# Patient Record
Sex: Female | Born: 1985 | Race: White | Hispanic: No | Marital: Married | Smoking: Never smoker
Health system: Southern US, Community
[De-identification: ages and names within clinical notes are randomized; demographics above are authoritative.]

## PROBLEM LIST (undated history)

## (undated) ENCOUNTER — Inpatient Hospital Stay (HOSPITAL_COMMUNITY): Payer: Self-pay

## (undated) DIAGNOSIS — K219 Gastro-esophageal reflux disease without esophagitis: Secondary | ICD-10-CM

## (undated) DIAGNOSIS — O169 Unspecified maternal hypertension, unspecified trimester: Secondary | ICD-10-CM

## (undated) DIAGNOSIS — R87629 Unspecified abnormal cytological findings in specimens from vagina: Secondary | ICD-10-CM

## (undated) DIAGNOSIS — Q796 Ehlers-Danlos syndrome, unspecified: Secondary | ICD-10-CM

## (undated) DIAGNOSIS — R112 Nausea with vomiting, unspecified: Secondary | ICD-10-CM

## (undated) DIAGNOSIS — R519 Headache, unspecified: Secondary | ICD-10-CM

## (undated) DIAGNOSIS — R51 Headache: Secondary | ICD-10-CM

## (undated) DIAGNOSIS — Z9889 Other specified postprocedural states: Secondary | ICD-10-CM

## (undated) DIAGNOSIS — F419 Anxiety disorder, unspecified: Secondary | ICD-10-CM

## (undated) DIAGNOSIS — K589 Irritable bowel syndrome without diarrhea: Secondary | ICD-10-CM

## (undated) DIAGNOSIS — G43909 Migraine, unspecified, not intractable, without status migrainosus: Secondary | ICD-10-CM

## (undated) DIAGNOSIS — F329 Major depressive disorder, single episode, unspecified: Secondary | ICD-10-CM

## (undated) DIAGNOSIS — F32A Depression, unspecified: Secondary | ICD-10-CM

## (undated) DIAGNOSIS — T8859XA Other complications of anesthesia, initial encounter: Secondary | ICD-10-CM

## (undated) DIAGNOSIS — M419 Scoliosis, unspecified: Secondary | ICD-10-CM

## (undated) DIAGNOSIS — J45909 Unspecified asthma, uncomplicated: Secondary | ICD-10-CM

## (undated) HISTORY — DX: Unspecified asthma, uncomplicated: J45.909

## (undated) HISTORY — DX: Depression, unspecified: F32.A

## (undated) HISTORY — DX: Unspecified abnormal cytological findings in specimens from vagina: R87.629

## (undated) HISTORY — DX: Major depressive disorder, single episode, unspecified: F32.9

## (undated) HISTORY — DX: Unspecified maternal hypertension, unspecified trimester: O16.9

## (undated) HISTORY — PX: TONSILLECTOMY: SUR1361

## (undated) HISTORY — DX: Irritable bowel syndrome, unspecified: K58.9

## (undated) HISTORY — DX: Headache, unspecified: R51.9

## (undated) HISTORY — DX: Anxiety disorder, unspecified: F41.9

## (undated) HISTORY — DX: Migraine, unspecified, not intractable, without status migrainosus: G43.909

## (undated) HISTORY — DX: Headache: R51

## (undated) HISTORY — PX: KNEE ARTHROSCOPY: SUR90

---
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
## 2003-06-26 ENCOUNTER — Inpatient Hospital Stay (HOSPITAL_COMMUNITY): Admission: EM | Admit: 2003-06-26 | Discharge: 2003-06-28 | Payer: Self-pay | Admitting: Urology

## 2003-07-24 ENCOUNTER — Ambulatory Visit (HOSPITAL_COMMUNITY): Admission: RE | Admit: 2003-07-24 | Discharge: 2003-07-24 | Payer: Self-pay | Admitting: Urology

## 2003-11-13 ENCOUNTER — Other Ambulatory Visit: Admission: RE | Admit: 2003-11-13 | Discharge: 2003-11-13 | Payer: Self-pay | Admitting: Gynecology

## 2004-04-22 ENCOUNTER — Other Ambulatory Visit: Admission: RE | Admit: 2004-04-22 | Discharge: 2004-04-22 | Payer: Self-pay | Admitting: Gynecology

## 2004-09-02 ENCOUNTER — Other Ambulatory Visit: Admission: RE | Admit: 2004-09-02 | Discharge: 2004-09-02 | Payer: Self-pay | Admitting: Gynecology

## 2004-10-12 ENCOUNTER — Ambulatory Visit (HOSPITAL_COMMUNITY): Admission: RE | Admit: 2004-10-12 | Discharge: 2004-10-12 | Payer: Self-pay | Admitting: Allergy

## 2005-02-08 ENCOUNTER — Other Ambulatory Visit: Admission: RE | Admit: 2005-02-08 | Discharge: 2005-02-08 | Payer: Self-pay | Admitting: Gynecology

## 2005-03-22 ENCOUNTER — Ambulatory Visit: Payer: Self-pay | Admitting: Internal Medicine

## 2005-04-01 ENCOUNTER — Ambulatory Visit: Payer: Self-pay | Admitting: Internal Medicine

## 2005-05-18 ENCOUNTER — Ambulatory Visit: Payer: Self-pay | Admitting: Internal Medicine

## 2005-05-24 ENCOUNTER — Ambulatory Visit (HOSPITAL_COMMUNITY): Admission: RE | Admit: 2005-05-24 | Discharge: 2005-05-24 | Payer: Self-pay | Admitting: Internal Medicine

## 2005-06-10 ENCOUNTER — Ambulatory Visit: Payer: Self-pay | Admitting: Internal Medicine

## 2005-06-17 ENCOUNTER — Ambulatory Visit (HOSPITAL_COMMUNITY): Admission: RE | Admit: 2005-06-17 | Discharge: 2005-06-17 | Payer: Self-pay | Admitting: Internal Medicine

## 2005-06-17 ENCOUNTER — Encounter: Payer: Self-pay | Admitting: Internal Medicine

## 2005-06-22 ENCOUNTER — Ambulatory Visit: Payer: Self-pay | Admitting: Internal Medicine

## 2005-07-08 ENCOUNTER — Encounter (INDEPENDENT_AMBULATORY_CARE_PROVIDER_SITE_OTHER): Payer: Self-pay | Admitting: *Deleted

## 2005-07-08 ENCOUNTER — Ambulatory Visit: Payer: Self-pay | Admitting: Internal Medicine

## 2005-07-08 DIAGNOSIS — K2990 Gastroduodenitis, unspecified, without bleeding: Secondary | ICD-10-CM

## 2005-07-08 DIAGNOSIS — K2970 Gastritis, unspecified, without bleeding: Secondary | ICD-10-CM | POA: Insufficient documentation

## 2005-08-09 ENCOUNTER — Other Ambulatory Visit: Admission: RE | Admit: 2005-08-09 | Discharge: 2005-08-09 | Payer: Self-pay | Admitting: Obstetrics and Gynecology

## 2006-02-10 ENCOUNTER — Ambulatory Visit: Payer: Self-pay | Admitting: Internal Medicine

## 2006-02-18 ENCOUNTER — Ambulatory Visit: Payer: Self-pay | Admitting: Internal Medicine

## 2006-02-18 ENCOUNTER — Encounter: Payer: Self-pay | Admitting: Internal Medicine

## 2006-02-18 DIAGNOSIS — K644 Residual hemorrhoidal skin tags: Secondary | ICD-10-CM | POA: Insufficient documentation

## 2006-06-21 ENCOUNTER — Ambulatory Visit: Payer: Self-pay | Admitting: Internal Medicine

## 2006-06-24 ENCOUNTER — Encounter (INDEPENDENT_AMBULATORY_CARE_PROVIDER_SITE_OTHER): Payer: Self-pay | Admitting: *Deleted

## 2006-06-24 ENCOUNTER — Ambulatory Visit: Payer: Self-pay | Admitting: Internal Medicine

## 2007-01-19 ENCOUNTER — Ambulatory Visit: Payer: Self-pay | Admitting: Internal Medicine

## 2007-04-03 IMAGING — RF DG UGI W/ SMALL BOWEL
19 of 24 series · 19 of 24 positions shown · non-contrast
Comparison: none

CLINICAL DATA: 19-year-old with right lower quadrant abdominal pain.  Constipation.
UGI:

[Series 1: run · 1 of 1 slices shown (1 of 19)]
[im 1/1]
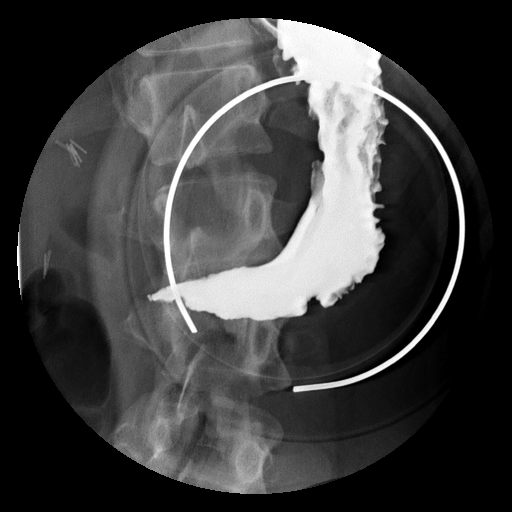

[Series 2: run · 1 of 1 slices shown (2 of 19)]
[im 1/1]
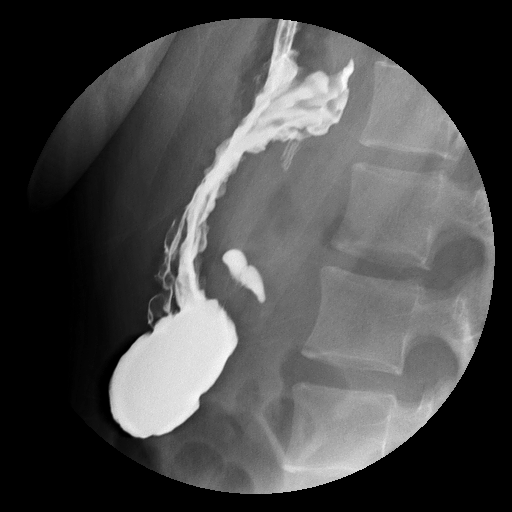

[Series 4: run · 1 of 1 slices shown (3 of 19)]
[im 1/1]
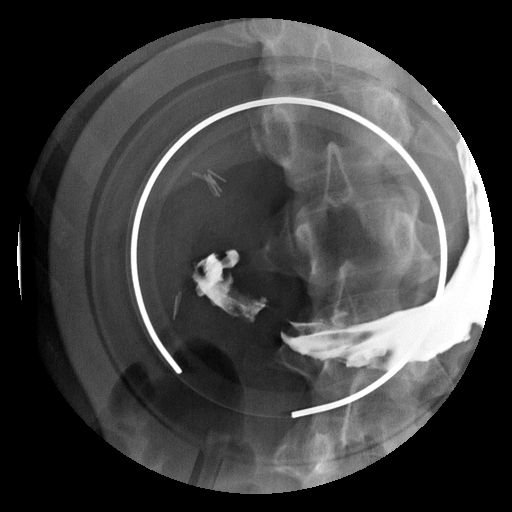

[Series 5: run · 1 of 1 slices shown (4 of 19)]
[im 1/1]
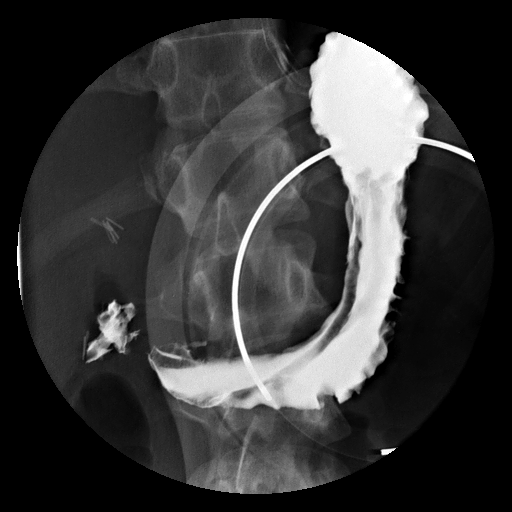

[Series 6: run · 1 of 1 slices shown (5 of 19)]
[im 1/1]
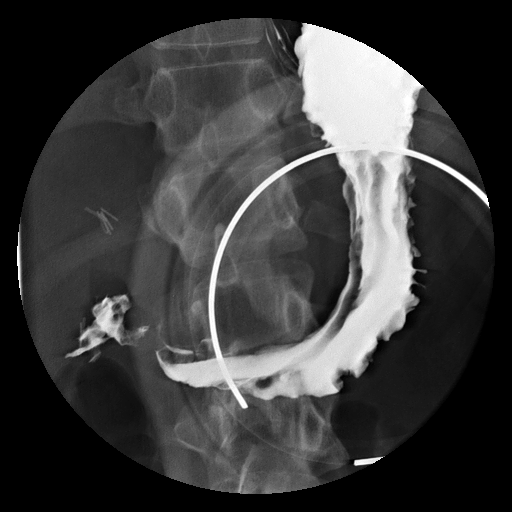

[Series 7: run · 1 of 1 slices shown (6 of 19)]
[im 1/1]
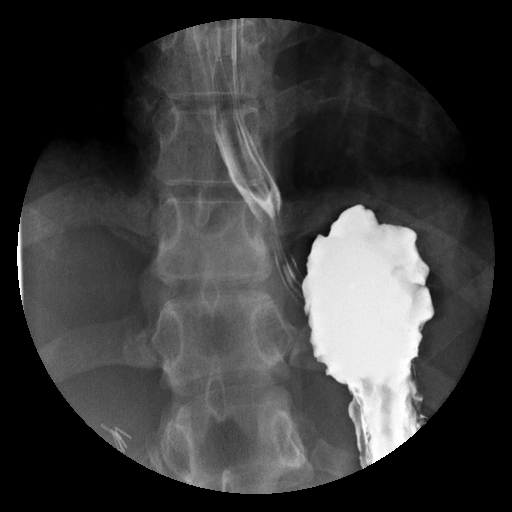

[Series 9: run · 1 of 1 slices shown (7 of 19)]
[im 1/1]
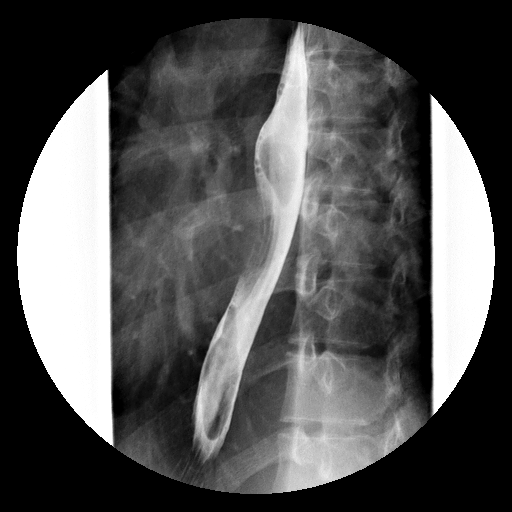

[Series 10: run · 1 of 1 slices shown (8 of 19)]
[im 1/1]
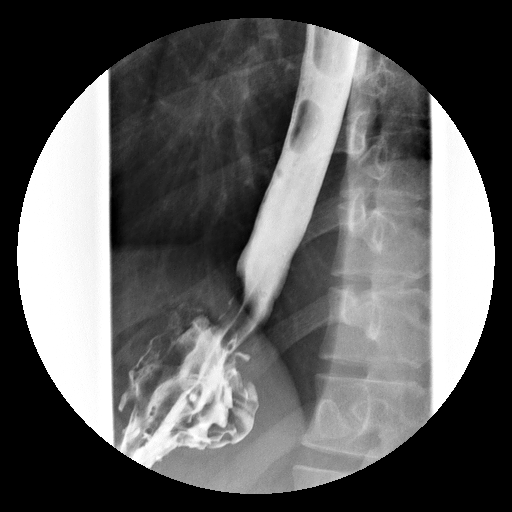

[Series 11: run · 1 of 1 slices shown (9 of 19)]
[im 1/1]
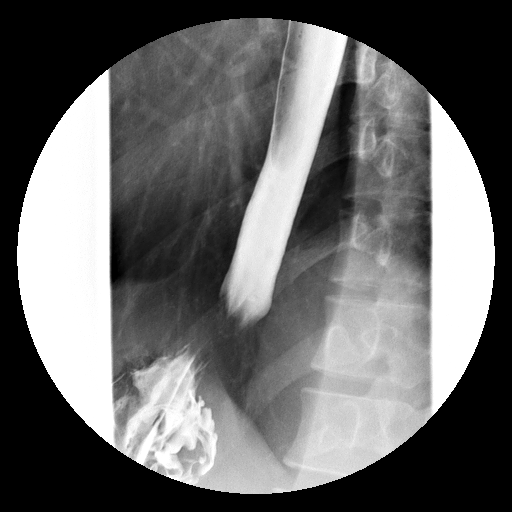

[Series 13: run · 1 of 1 slices shown (10 of 19)]
[im 1/1]
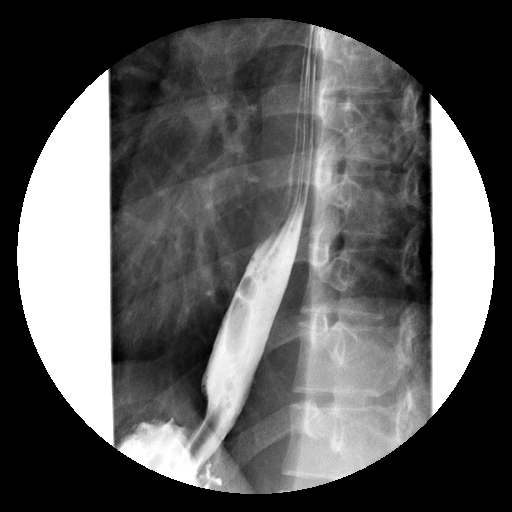

[Series 14: run · 1 of 1 slices shown (11 of 19)]
[im 1/1]
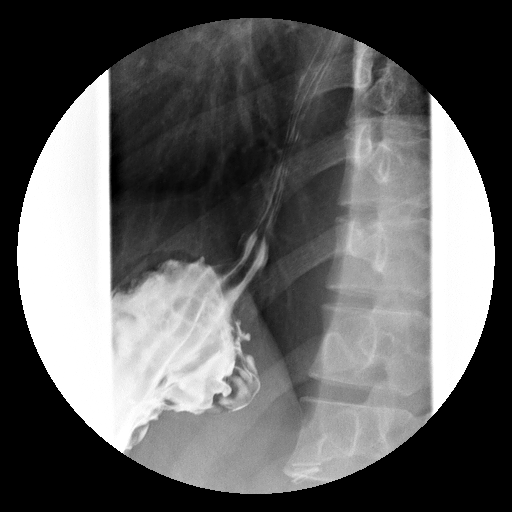

[Series 15: run · 1 of 1 slices shown (12 of 19)]
[im 1/1]
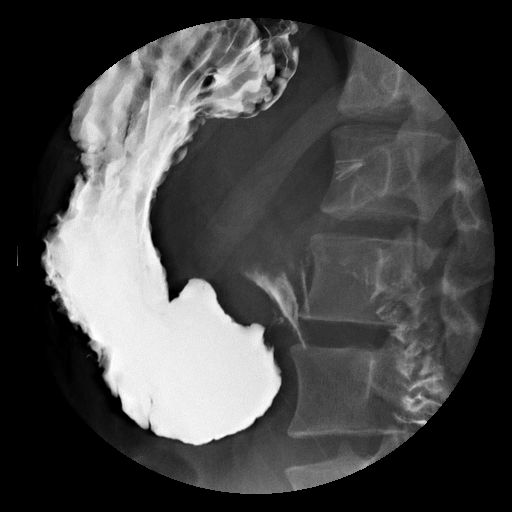

[Series 16: run · 1 of 1 slices shown (13 of 19)]
[im 1/1]
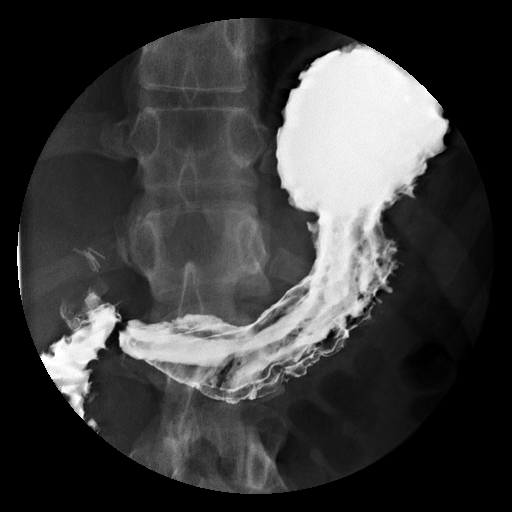

[Series 18: run · 1 of 1 slices shown (14 of 19)]
[im 1/1]
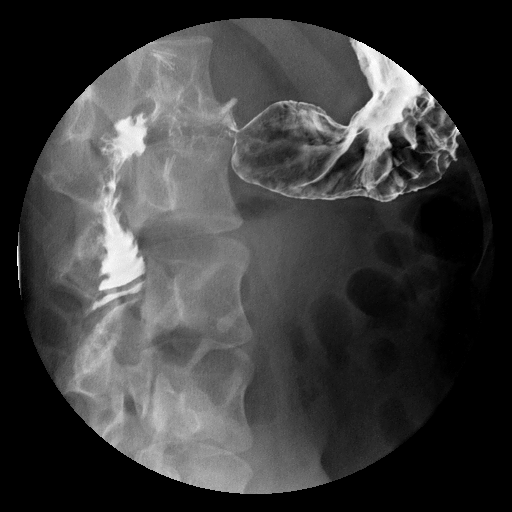

[Series 19: run · 1 of 1 slices shown (15 of 19)]
[im 1/1]
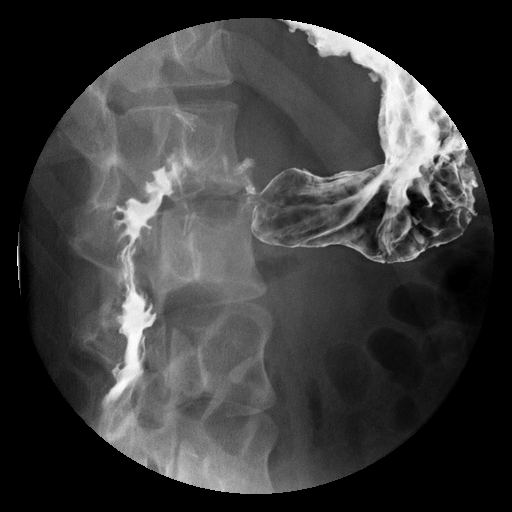

[Series 20: run · 1 of 1 slices shown (16 of 19)]
[im 1/1]
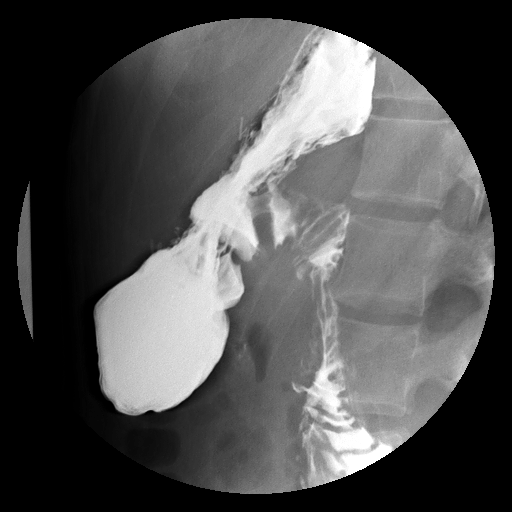

[Series 21: run · 1 of 1 slices shown (17 of 19)]
[im 1/1]
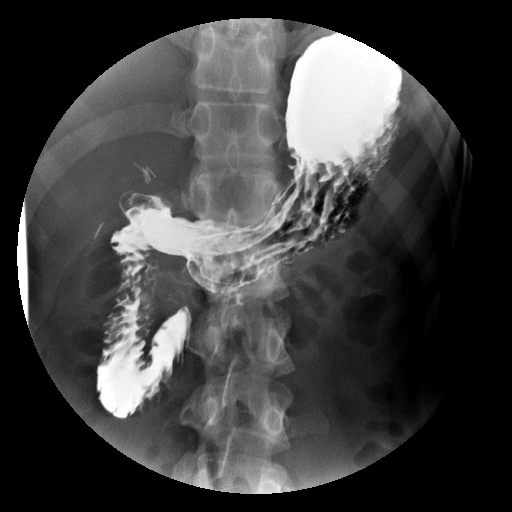

[Series 23: run · 1 of 1 slices shown (18 of 19)]
[im 1/1]
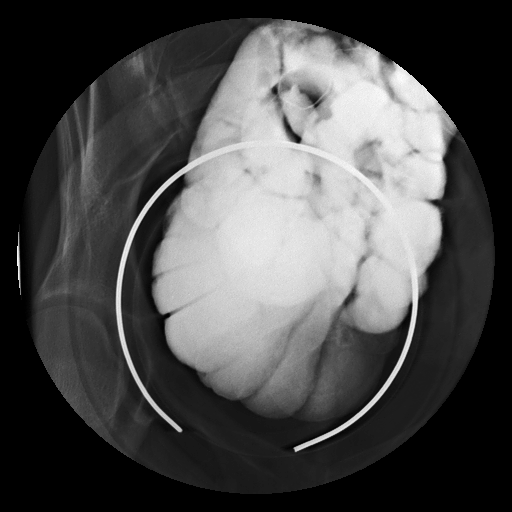

[Series 24: run · 1 of 1 slices shown (19 of 19)]
[im 1/1]
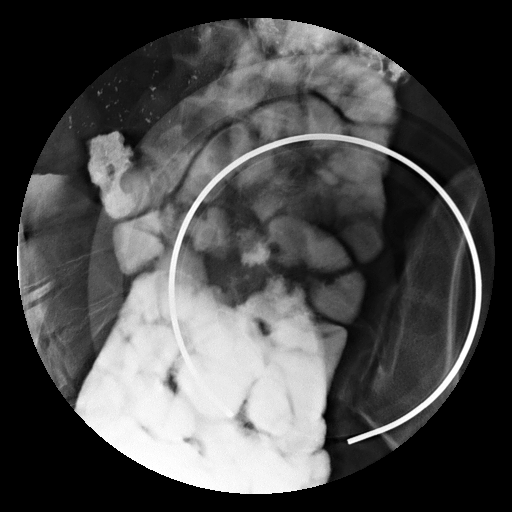

[19 of 24 positions shown; findings below may reference images not displayed]

FINDINGS: Normal esophageal motility.  No intrinsic or extrinsic lesions of the esophagus were identified and no mucosal abnormalities were seen.  The patient does have a small sliding type hiatal hernia.  No gastroesophageal reflux was demonstrated.  
The stomach, duodenal bulb and C-loop are normal in appearance.
IMPRESSION: 1.  Small sliding type hiatal hernia without gastroesophageal reflux.  
2.  Otherwise, unremarkable upper GI study.
SMALL BOWEL FOLLOW THROUGH:
Small bowel transit time was normal at 1 hour.  The patient has a low-lying cecum which is in the low pelvis. The patient is quite thin and small bowel loops were clustered in the pelvis also.  I was not able to identify the terminal ileum but I do not see any definite inflammatory changes or separated loops of bowel.  The proximal loops of jejunum demonstrate a normal mucosal fold pattern and no fixed filling defects.  The ileal loops of small bowel also demonstrate normal mucosal fold pattern and no mass lesions are seen.
IMPRESSION: 1.  Normal appearing loops of jejunum and ileum with a normal small bowel transit time.
2.  Terminal ileum could not be visualized because of a low-lying cecum deep in the pelvis.

## 2007-06-22 ENCOUNTER — Encounter: Payer: Self-pay | Admitting: Cardiovascular Disease

## 2008-01-27 DIAGNOSIS — G43009 Migraine without aura, not intractable, without status migrainosus: Secondary | ICD-10-CM

## 2008-01-27 DIAGNOSIS — J45909 Unspecified asthma, uncomplicated: Secondary | ICD-10-CM | POA: Insufficient documentation

## 2008-01-27 DIAGNOSIS — K219 Gastro-esophageal reflux disease without esophagitis: Secondary | ICD-10-CM | POA: Insufficient documentation

## 2008-01-27 DIAGNOSIS — J309 Allergic rhinitis, unspecified: Secondary | ICD-10-CM | POA: Insufficient documentation

## 2008-01-27 DIAGNOSIS — Z8719 Personal history of other diseases of the digestive system: Secondary | ICD-10-CM

## 2008-01-27 HISTORY — DX: Unspecified asthma, uncomplicated: J45.909

## 2008-03-08 DIAGNOSIS — Q796 Ehlers-Danlos syndrome, unspecified: Secondary | ICD-10-CM | POA: Insufficient documentation

## 2008-03-08 DIAGNOSIS — K589 Irritable bowel syndrome without diarrhea: Secondary | ICD-10-CM | POA: Insufficient documentation

## 2008-11-25 ENCOUNTER — Encounter: Payer: Self-pay | Admitting: Internal Medicine

## 2009-07-22 ENCOUNTER — Ambulatory Visit (HOSPITAL_COMMUNITY): Admission: RE | Admit: 2009-07-22 | Discharge: 2009-07-22 | Payer: Self-pay | Admitting: Obstetrics and Gynecology

## 2009-08-04 ENCOUNTER — Encounter: Payer: Self-pay | Admitting: Cardiovascular Disease

## 2009-09-01 DIAGNOSIS — Q796 Ehlers-Danlos syndrome, unspecified: Secondary | ICD-10-CM | POA: Insufficient documentation

## 2009-09-08 ENCOUNTER — Ambulatory Visit: Payer: Self-pay | Admitting: Cardiovascular Disease

## 2009-09-08 DIAGNOSIS — R002 Palpitations: Secondary | ICD-10-CM | POA: Insufficient documentation

## 2009-09-18 ENCOUNTER — Ambulatory Visit: Payer: Self-pay | Admitting: Cardiology

## 2009-09-18 ENCOUNTER — Encounter: Payer: Self-pay | Admitting: Cardiovascular Disease

## 2009-09-18 ENCOUNTER — Ambulatory Visit (HOSPITAL_COMMUNITY): Admission: RE | Admit: 2009-09-18 | Discharge: 2009-09-18 | Payer: Self-pay | Admitting: Cardiovascular Disease

## 2009-09-18 ENCOUNTER — Ambulatory Visit: Payer: Self-pay

## 2010-02-01 ENCOUNTER — Inpatient Hospital Stay (HOSPITAL_COMMUNITY): Admission: AD | Admit: 2010-02-01 | Discharge: 2010-02-04 | Payer: Self-pay | Admitting: Obstetrics and Gynecology

## 2010-02-10 ENCOUNTER — Encounter: Admission: RE | Admit: 2010-02-10 | Discharge: 2010-02-19 | Payer: Self-pay | Admitting: Obstetrics and Gynecology

## 2010-06-16 DIAGNOSIS — J309 Allergic rhinitis, unspecified: Secondary | ICD-10-CM | POA: Insufficient documentation

## 2010-06-28 ENCOUNTER — Emergency Department (HOSPITAL_BASED_OUTPATIENT_CLINIC_OR_DEPARTMENT_OTHER): Admission: EM | Admit: 2010-06-28 | Discharge: 2010-06-29 | Payer: Self-pay | Admitting: Emergency Medicine

## 2010-09-13 ENCOUNTER — Encounter: Payer: Self-pay | Admitting: Obstetrics and Gynecology

## 2010-09-13 ENCOUNTER — Encounter: Payer: Self-pay | Admitting: Allergy

## 2010-09-22 NOTE — Letter (Signed)
Summary: [facility] P[facility] Flow Record   [facility]   Imported By: Roderic Ovens 10/01/2009 12:11:56  _____________________________________________________________________  External Attachment:    Type:   Image     Comment:   External Document

## 2010-09-22 NOTE — Assessment & Plan Note (Signed)
Summary: npv/ehlers-danlos syndrome/jml  Medications Added PRENATAL VITAMINS 0.8 MG TABS (PRENATAL MULTIVIT-MIN-FE-FA) Take 1 tablet by mouth once a day FERROUS SULFATE 325 (65 FE) MG TBEC (FERROUS SULFATE) Take 1 tablet by mouth once a day        Visit Type:  Initial Consult Primary Provider:  Dr Senaida Ores  CC:  New PV evaluation per Dr. Huel Cote-.  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 The patient is at [redacted] weeks gestation of her first pregnancy. She was diagnosed with Ehlers-Danlos syndrome approximately 4 years ago. She has the joint hypermobility subtype and has tested negative for the vascular subtype.  The patient reports an innocent heart murmur during early childhood, but this apparently resolved as an adult. She has no history of cardiovascular disease. She has had episodic palpitations.  The patient denies chest pain, dyspnea, orthopnea, PND, edema, lightheadedness, or syncope.   Current Medications (verified): 1)  Prenatal Vitamins 0.8 Mg Tabs (Prenatal Multivit-Min-Fe-Fa) .... Take 1 Tablet By Mouth Once A Day 2)  Ferrous Sulfate 325 (65 Fe) Mg Tbec (Ferrous Sulfate) .... Take 1 Tablet By Mouth Once A Day  Allergies: 1)  ! Keflex 2)  ! * Ketac  Past History:  Past medical, surgical, family and social histories (including risk factors) reviewed, and no changes noted (except as noted below).  Past Medical History: Reviewed history from 09/01/2009 and no changes required. Current Problems:  EHLERS-DANLOS SYNDROME (ICD-756.83) GERD (ICD-530.81) COLITIS, HX OF (ICD-V12.79) GASTRITIS (ICD-535.50) HEMORRHOIDS, EXTERNAL (ICD-455.3) IRRITABLE BOWEL SYNDROME, HX OF (ICD-V12.79) ALLERGIC RHINITIS (ICD-477.9) ANXIETY (ICD-300.00) ASTHMA (ICD-493.90) HEADACHE, CHRONIC, HX OF (ICD-V15.9)  Past Surgical History: Reviewed history from 09/01/2009 and no changes  required. Rt Knee Arthroscopy (1998, 2001) Cholecystectomy (2002) Tonsillectomy (1988) Bilateral Ear Tubes Placed (1986) Bilateral turbinate surgery (1998)  Family History: Reviewed history from 09/01/2009 and no changes required.  Her father has history of bipolar disorder.  There is no other history of other significant family history.  Social History: Reviewed history from 09/01/2009 and no changes required. She is married. She is a CMA. No children . No alcohol, tobacco or drugs.  Review of Systems       Positive for joint pains, seasonal allergies, constipation, gastroesophageal reflux, anxiety, depression, otherwise negative except as per history of present illness  Vital Signs:  Patient profile:   25 year old female Height:      66 inches Weight:      125.50 pounds BMI:     20.33 Pulse rate:   98 / minute Pulse rhythm:   regular Resp:     18 per minute BP sitting:   120 / 70  (left arm) Cuff size:   regular  Vitals Entered By: Vikki Ports (September 08, 2009 3:53 PM)  Serial Vital Signs/Assessments:  Time      Position  BP       Pulse  Resp  Temp     By           R Arm     120/70                         Vikki Ports   Physical Exam  General:  Pt is well-developed, thin, young woman, alert and oriented, no acute distress HEENT: normal Neck: no thyromegaly           JVP normal, carotid upstrokes normal without bruits Lungs: CTA Chest:  equal expansion  CV: Apical impulse nondisplaced, RRR with a grade 2/6 systolic ejection murmur best heard at the right upper sternal border, no diastolic murmurs or clicks Abd: soft, NT, positive BS, no HSM, no bruit Back: no CVA tenderness Ext: no clubbing, cyanosis, or edema        femoral pulses 2+ without bruits        pedal pulses 2+ and equal Skin: warm, dry, no rash Neuro: CNII-XII intact,strength 5/5 = b/l    EKG  Procedure date:  09/08/2009  Findings:      Normal sinus rhythm, heart rate 98 beats per  minute, within normal limits.  Impression & Recommendations:  Problem # 1:  EHLERS-DANLOS SYNDROME (ICD-756.83) Ehlers-Danlos syndrome has been associated with mitral valve prolapse and aortopathy. The patient had an echocardiogram performed in 2008 at [facility]. This demonstrated normal left ventricular size and function, normal dimensions of the aortic root, and no evidence of mitral valve prolapse. Since she is now in the second trimester of pregnancy, I would recommend a repeat echocardiogram to reassess her ascending aortic dimensions.  Of her aorta is poorly visualized by echo, she could undergo an MRA. This will also assess for mitral valve prolapse, but there is no physical exam evidence for this.  Problem # 2:  PALPITATIONS (ICD-785.1) suspect benign. Will reassess LV function by echo. Minimize caffeine. Orders: Echocardiogram (Echo)  Patient Instructions: 1)  Your physician recommends that you schedule a follow-up appointment in: 2 years 2)  Your physician has requested that you have an echocardiogram.  Echocardiography is a painless test that uses sound waves to create images of your heart. It provides your doctor with information about the size and shape of your heart and how well your heart's chambers and valves are working.  This procedure takes approximately one hour. There are no restrictions for this procedure.

## 2010-11-03 LAB — URINALYSIS, ROUTINE W REFLEX MICROSCOPIC
Glucose, UA: NEGATIVE mg/dL
Leukocytes, UA: NEGATIVE
Nitrite: NEGATIVE
Protein, ur: NEGATIVE mg/dL
Urobilinogen, UA: 0.2 mg/dL (ref 0.0–1.0)
pH: 5.5 (ref 5.0–8.0)

## 2010-11-03 LAB — URINE MICROSCOPIC-ADD ON

## 2010-11-03 LAB — PREGNANCY, URINE: Preg Test, Ur: NEGATIVE

## 2010-11-04 DIAGNOSIS — J069 Acute upper respiratory infection, unspecified: Secondary | ICD-10-CM

## 2010-11-04 HISTORY — DX: Acute upper respiratory infection, unspecified: J06.9

## 2010-11-06 DIAGNOSIS — F4321 Adjustment disorder with depressed mood: Secondary | ICD-10-CM

## 2010-11-06 DIAGNOSIS — F4322 Adjustment disorder with anxiety: Secondary | ICD-10-CM

## 2010-11-06 HISTORY — DX: Adjustment disorder with depressed mood: F43.21

## 2010-11-06 HISTORY — DX: Adjustment disorder with anxiety: F43.22

## 2010-11-09 LAB — RPR: RPR Ser Ql: NONREACTIVE

## 2010-11-09 LAB — CBC
Hemoglobin: 11.5 g/dL — ABNORMAL LOW (ref 12.0–15.0)
MCV: 96.2 fL (ref 78.0–100.0)
MCV: 97.2 fL (ref 78.0–100.0)
RBC: 3.36 MIL/uL — ABNORMAL LOW (ref 3.87–5.11)
RDW: 13.3 % (ref 11.5–15.5)

## 2011-01-05 NOTE — Assessment & Plan Note (Signed)
Alison Nichols                         GASTROENTEROLOGY OFFICE NOTE   Alison Nichols, Alison Nichols                    MRN:          [id number]  DATE:01/19/2007                            DOB:          [birth year]    CHIEF COMPLAINT:  Follow up on constipation.   HISTORY OF PRESENT ILLNESS:  She needs a refill on her Amitiza.  That  works pretty well.  Her early satiety and anorexia problems are less.  She has gained some weight.  Medications are listed and reviewed in the  chart.  Prevacid is expensive on her new insurance plan, so she is using  that intermittently.  See my past medical history and problem list from  June 21, 2006.   PHYSICAL EXAMINATION:  VITAL SIGNS:  Weight 103 pounds, pulse 74, blood  pressure 112/70.  NOTE:  She says potatoes and certain pastas cause diarrhea at times.  In  general, she tends to be constipated, but she will really unload with  multiple stools when she takes a stimulant laxative.  She has been off  her Amitiza for about two months.  The combination of Amitiza and  Zelnorm really helped her the best.   ASSESSMENT:  Irritable bowel syndrome, mainly constipation predominant  with some diarrhea, i.e., there is an alternation.   PLAN:  1. Refill Amitiza 24 mcg b.i.d.  2. Discuss adding Sorbitol versus lactulose.  She can continue to use      some intermittent stimulant laxatives such as Dulcolax as well.  3. Consider erythromycin intermittently if the above does not work,      125 mg titrating for effect intermittently.  I am going to asked      her to try intermittent Dulcolax first.  She can call back and we      can try the erythromycin perhaps.  In the past, I thought BuSpar      might help her for her functional dyspepsia problems, but that is      less of an issue now.  4. Ampules of Prevacid given to the patient today.  5. Further plans pending clinical course.  I do not think any further      investigation is  necessary at this time.     Iva Boop, MD,FACG  Electronically Signed    CEG/MedQ  DD: 01/19/2007  DT: 01/19/2007  Job #: [id number]

## 2011-01-08 NOTE — Discharge Summary (Signed)
Alison Nichols, Alison Nichols                       ACCOUNT NO.:  [id number]   MEDICAL RECORD NO.:  [id number]                   PATIENT TYPE:  INP   LOCATION:  0348                                 FACILITY:  [facility]   PHYSICIAN:  Bertram Millard. Dahlstedt, M.D.          DATE OF BIRTH:  25-Dec-1985   DATE OF ADMISSION:  06/26/2003  DATE OF DISCHARGE:  06/28/2003                                 DISCHARGE SUMMARY   DIAGNOSES:  1. Pyelonephritis.  2. Renal insufficiency, mild.  3. Dehydration.   BRIEF HISTORY:  This nice 25 year old was admitted to the hospital with  pyelonephritis.  She had pan sensitive E. coli and was initially treated as  an outpatient.  However, she was unable to keep food or fluids down, and  presented dehydrated with significant nausea and vomiting.  She was admitted  for hydration and IV antibiotic management.   HOSPITAL COURSE:  The patient was admitted on June 26, 2003.  She was  vigorously hydrated.  She was found to have a creatinine of 2.5.  She had no  evidence of obstruction or a perinephric abscess on her ultrasound.  With  vigorous IV hydration and IV antibiotics, she improved rapidly.  She  eventually defervesced.  She eventually had control of her nausea and was  taking oral medications and food well.  She was discharged home on June 28, 2003.  At that time, she was in greatly improved condition.  She was  discharged home on Zofran as needed for nausea, and the Cipro XR 1000 mg  once a day.  This will continue for approximately 10 days.  She will follow  up with me in approximately 10 days.                                               Bertram Millard. Dahlstedt, M.D.    SMD/MEDQ  D:  07/25/2003  T:  07/25/2003  Job:  [id number]

## 2011-01-08 NOTE — H&P (Signed)
Alison Nichols, Alison Nichols                       ACCOUNT NO.:  [id number]   MEDICAL RECORD NO.:  [id number]                   PATIENT TYPE:  INP   LOCATION:  0348                                 FACILITY:  [facility]   PHYSICIAN:  Bertram Millard. Dahlstedt, M.D.          DATE OF BIRTH:  1986/01/14   DATE OF ADMISSION:  06/26/2003  DATE OF DISCHARGE:                                HISTORY & PHYSICAL   REASON FOR ADMISSION:  Kidney infection.   BRIEF HISTORY:  This nice 25 year old female is admitted with  pyelonephritis.  I first saw her two days ago.  At that time she was febrile  and was diagnosed with acute pyelonephritis.  She was cultured and the  culture came back today showing a pan sensitive E. coli.  She had placed on  Cipro.  Over the past two days she has had persistent fever to 102, nausea  and vomiting.  She has been unable to keep food or fluid down.  She has been  unable to keep her medications down.  She was seen in the office and I felt  it best to admit the patient for further management, IV hydration, and IV  antibiotic management.   She has had prior urinary tract infections, but these have not been febrile.  She has no voiding symptoms at the present time, just back pain concentrated  on the left side.  Renal ultrasound was performed in the office today.  This  showed normal kidneys, no perinephric abscess, no intrarenal abscess, and no  evidence of hydronephrosis.  She had a minimal post void residual.   PAST MEDICAL HISTORY:  1. Laparoscopic cholecystectomy in December 2002.  2. Right knee surgery x2.  3. Turbinate reduction in her nose, as well as a tonsillectomy.  4. She was diagnosed with depression in July 2001.   MEDICATIONS:  Not available at this time other than over-the-counter NSAIDS  and Cipro.   ALLERGIES:  1. She is allergic to [facility] which causes hives.  2. She also has reactions to VICRYL SUTURES.   FAMILY HISTORY:  Noncontributory.   REVIEW OF  SYSTEMS:  Noncontributory.   PHYSICAL EXAMINATION:  GENERAL:  A very pleasant, but ill-appearing young  adult female.  VITAL SIGNS:  Weight 123.  She has been febrile to 102 degrees today.  In  the office, her blood pressure was normal.  HEENT:  Mucous membranes to be dry.  HEART:  Regular rate and rhythm.  No murmurs, rubs, or gallops were present.  LUNGS:  Clear throughout lung fields.  ABDOMEN:  Flat, soft, nondistended, with mild left upper and left CVA  tenderness.  No left lower quadrant tenderness.  No rebound or guarding.  GENITOURINARY:  External genitalia were unremarkable.  PELVIC:  Revealed normal bladder, normal adnexa and uterus.  No pelvic  masses or tenderness.   LABORATORY DATA:  Urinalysis two days ago revealed an infection, and recent  culture  revealed E. coli, pan sensitive.   IMPRESSION:  1. Pyelonephritis, acute, not resolving with oral/outpatient management.  2. Dehydration.   PLAN:  1. Admit for intravenous hydration.  2. We will switch to intravenous Cipro.  3. We will ordered antiemetics.   The patient's white count on admission was 18,600, CBC was otherwise  unremarkable.  BMET revealed a sodium of 134, potassium 3.4, bicarb 22,  chloride 105, glucose 115, BUN 22, creatinine 2.5.                                               Bertram Millard. Dahlstedt, M.D.    SMD/MEDQ  D:  06/26/2003  T:  06/26/2003  Job:  [id number]   cc:   Barry Dienes. Eloise Harman, M.D.  [address]  [place]  [place] [address]  Fax: [phone]
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
## 2011-01-08 NOTE — Op Note (Signed)
Alison Nichols, Alison Nichols             ACCOUNT NO.:  [id number]   MEDICAL RECORD NO.:  [id number]          PATIENT TYPE:  AMB   LOCATION:  ENDO                         FACILITY:  [facility]   PHYSICIAN:  Iva Boop, M.D. LHCDATE OF BIRTH:  1986/01/17   DATE OF PROCEDURE:  06/17/2005  DATE OF DISCHARGE:  06/17/2005                                 OPERATIVE REPORT   PROCEDURE:  Small bowel capsule endoscopy.   INDICATIONS:  Abdominal pain.  Bowel habit changes.  Abnormal small bowel  series, in that the ileum was not seen well.  There is some question of  celiac disease in this lady, based on her clinical pattern.   PROCEDURE DATA:  Height 68 inches.  Weight 128 pounds.  Waist 30 inches,  built in.  The gastric passage time was 16 minutes.  The small bowel passage  time was 4 hours, 21 minutes.  These were normal.   INFORMATION/FINDINGS:  This is a complete study.  The capsule did reach the  colon.  There was poor visualization of the stomach and thickened duodenum  with bilious fluid present.  There was erythema of the stomach, possible  gastritis.  In addition, the proximal duodenum looked abnormal with erythema  and mosaic pattern and possible villous atrophy.  The remainder of the small  bowel looked normal.   RECOMMENDATIONS/PLAN:  I will review her chart.  She may need an EGD if this  is not done.  I believe she has been tested for celiac disease already with  antibodies but will need to confirm that.  Autoimmune or allergic phenomenon  are possible as well.      Iva Boop, M.D. [facility]  Electronically Signed     CEG/MEDQ  D:  06/22/2005  T:  06/22/2005  Job:  [id number]

## 2011-01-12 NOTE — Discharge Summary (Signed)
[facility]  Patient:    Alison, Nichols                    MRN: [id number] Adm. Date:  [id number] Disc. Date: 03/08/00 Attending:  Veneta Penton                           Discharge Summary  REASON FOR ADMISSION:  This 25 year old white female was admitted for inpatient psychiatric stabilization because of increasing symptoms of depression and plan to harm herself.  She stated she could not contract with me to prevent herself from harming herself and, consequently, was admitted for inpatient psychiatric stabilization at that time.  For further history of present illness, please see the patients psychiatric admission assessment.  ADMISSION MEDICATIONS:  Birth control pills for dysmenorrhea.  Claritin for allergic rhinitis.  Singulair and Proventil inhaler for asthma.  PAST MEDICAL HISTORY: 1. History of tension headaches. 2. Irritable bowel syndrome.  PAST SURGICAL HISTORY: 1. Tonsillectomy. 2. Turbinate removal for chronic sinusitis. 3. Right knee surgery x 2 in the past for soccer injuries.  Otherwise unremarkable medical history and physical examination.  LABORATORY DATA:  The patient underwent a laboratory workup to rule out any medical problems contributing to her symptomatology.  A urine pregnancy test was negative.  A ______ urine probe was pending at the time of discharge. Hepatic panel was unremarkable.  Metabolic panel was unremarkable.  A UA was within normal limits.  RPR was nonreactive.  A CBC showed a hemoglobin of 14.9, hematocrit of 44.6, normal indices, and was otherwise unremarkable. Thyroid function tests were within normal limits.  A urine drug screen was unremarkable.  There were no x-rays obtained during the course of this hospitalization.  PROCEDURES:  There were no other special procedures performed during the course of this hospitalization.  CONSULTATIONS:  The patient received no additional consultations  during the course of this hospitalization.  COMPLICATIONS:  There were no complications sustained over her hospital course.  HOSPITAL COURSE:  The patient adapted to unit routine, socializing well with both patients and staff.  She continued to have multiple somatic complaints without any significant physical findings on examination.  She was begun on a trial of nortriptyline and titrated up to a dose of 50 mg p.o. q.h.s., which she has tolerated well, along with increasing her Zoloft from 100 mg p.o. q.d. that she came in with to 200 mg per day.  She denies any side effects at this time.  A discussion of the risks and benefits was held with the patient and her mother, who continued to agree to this trial of antidepressant medication. At the time of discharge, the patient has been able to address her psychosocial stressors and improve her coping skills.  Her affect and mood have improved, insight has improved, concentration and attention span are improving.  She denies any homicidal or suicidal ideation or plans.  She denies any thoughts of self-harm.  She has displayed no evidence of thought disorder throughout her hospital course.  She was able to participate in all aspects of the therapeutic program and, consequently, is felt to be able to be transitioned to a less restrictive alternative.  FINAL DIAGNOSES ACCORDING TO DSM-IV:   AXIS I.  1. Major depression, single episode, severe, without psychosis.            2. Undifferentiated somatization disorder.  3. Anxiety disorder, not otherwise specified; rule out               post-traumatic stress disorder.  AXIS II.  None. AXIS III.  1. Asthma.            2. Irritable bowel syndrome.            3. Tension headaches.            4. Allergic rhinitis.            5. History of chronic sinusitis.  AXIS IV.  Current psychosocial stressors are severe.   AXIS V.  Code 15 on admission, code 30 on discharge.  FURTHER EVALUATION  AND TREATMENT RECOMMENDATIONS: 1. The patient will follow up with Dr. Carolanne Grumbling or myself in the    outpatient clinic at [facility] for all further    aspects of her psychiatric care.  She will also follow up for outpatient    therapy through her outpatient therapist. 2. She will continue on:    a. Nortriptyline 50 mg p.o. q.h.s.    b. Zoloft 200 mg p.o. q.h.s.    c. Singulair 10 mg p.o. q.d.    d. Claritin-D 24-hour 1 p.o. q.d.    e. Proventil inhaler 2 puffs q.4h. p.r.n. asthma. 3. She will resume her previous level of activity and regular diet. 4. She is discharged to home in the custody of her mother.DD:  03/08/00 TD:  03/08/00 Job: [id number]

## 2011-01-12 NOTE — Op Note (Signed)
[facility]  Patient:    Alison Nichols, ART Visit Number: [id number] MRN: [id number]          Service Type: SUR Location: 3W 4782 01 Attending Physician:  Tempie Donning Dictated by:   Gita Kudo, M.D. Proc. Date: 08/08/01 Admit Date:  08/08/2001 Discharge Date: 08/08/2001   CC:         Clearence Cheek., M.D.  Juan Quam, M.D.   Operative Report  OPERATIVE PROCEDURE:  Laparoscopic cholecystectomy.  SURGEON:  Gita Kudo, M.D.  ASSISTANT:  Velora Heckler, M.D.  ANESTHESIA:  General endotracheal.  PREOPERATIVE DIAGNOSIS:  Abnormal gallbladder--sludge.  POSTOPERATIVE DIAGNOSIS:  Abnormal gallbladder--sludge, grossly normal gallbladder.  CLINICAL SUMMARY: A 25 year old female with severe asthma has had nausea and vomiting and a very severe attack of abdominal pain causing her to go to the emergency room. All of her lab tests--CBC, CMET, amylase, UA were normal. The chest x-ray was negative. Gallbladder and abdominal ultrasound showed gallbladder sludge but otherwise normal. It is felt that it is reasonable to bring her in for elective cholecystectomy since she would probably end up with gallstones because of the sludge and also to do her while her asthma was in good control.  OPERATIVE FINDINGS:  The gallbladder was bluish in color, normal size and not at all inflamed, no adhesions. The cystic duct and artery were closely associated and were taken together.  DESCRIPTION OF PROCEDURE:  Under satisfactory general endotracheal anesthesia, having received 1.0 g of Ancef preoperatively, the patients abdomen was prepped and draped in a standard fashion. The umbilicus was opened in a vertical incision to avoid scarring. The patient has had very severe scarring from excision of skin lesions and we were trying to attempt to make our incisions as small as possible and not use any subcutaneous.  The midline was opened  into the peritoneum and a figure-of-eight 0 Vicryl suture was used in the fascia. The #10 Hasson port was inserted, secured and good CO2 pneumoperitoneum established. Under direct vision, through Marcaine infiltrated skin sites, three #5 ports were placed through small incisions, two laterally and one medially. Graspers through the lateral port gave excellent exposure and with good visualization, operating through the medial port, we removed the gallbladder. The gallbladder-cystic duct junction was identified and carefully dissected. It was noted that the cystic duct and artery were closely joined and that it would be difficult to separate them and since they were both to be removed, I put multiple metal clips across both after we were certain of the anatomy and then divided them. The gallbladder was then removed from below upward using coagulating spatula for hemostasis and dissection. There was a posterior accessory vessel that was bleeding and controlled with a clipping cautery. After the gallbladder was amputated, the liver bed was checked for hemostasis which was good. There was no blood or bowel noted. The abdomen was lavaged with saline and suctioned dry. Then the camera was removed and a #5 camera placed in the upper port and through the umbilical port, a grasper placed and used to extract the gallbladder intact, without spillage or complication. Following this the CO2 was released, the ports removed under direct vision, and the midline closed at the previously placed figure-of-eight suture. No subcutaneous was used. The umbilical site was then infiltrated with Marcaine and all skin incisions closed with staples. Steri-Strips applied over them and then the patient went to the recovery room from the operating room in good condition.  There were no complications and the sponge and needle counts were correct. Dictated by:   Gita Kudo, M.D. Attending Physician:  Tempie Donning DD:  08/08/01 TD:  08/08/01 Job: [id number]

## 2013-07-05 ENCOUNTER — Ambulatory Visit (HOSPITAL_COMMUNITY): Payer: Medicaid Other | Attending: Obstetrics and Gynecology

## 2013-07-05 ENCOUNTER — Other Ambulatory Visit (HOSPITAL_COMMUNITY): Payer: Self-pay | Admitting: Obstetrics and Gynecology

## 2013-07-05 ENCOUNTER — Encounter: Payer: Self-pay | Admitting: Cardiovascular Disease

## 2013-07-05 ENCOUNTER — Encounter (INDEPENDENT_AMBULATORY_CARE_PROVIDER_SITE_OTHER): Payer: Self-pay

## 2013-07-05 DIAGNOSIS — I379 Nonrheumatic pulmonary valve disorder, unspecified: Secondary | ICD-10-CM | POA: Insufficient documentation

## 2013-07-05 DIAGNOSIS — Q796 Ehlers-Danlos syndrome, unspecified: Secondary | ICD-10-CM

## 2013-07-05 DIAGNOSIS — Q7962 Hypermobile Ehlers-Danlos syndrome: Secondary | ICD-10-CM

## 2013-07-05 DIAGNOSIS — I079 Rheumatic tricuspid valve disease, unspecified: Secondary | ICD-10-CM | POA: Insufficient documentation

## 2013-07-05 DIAGNOSIS — O99891 Other specified diseases and conditions complicating pregnancy: Secondary | ICD-10-CM | POA: Insufficient documentation

## 2013-07-05 NOTE — Progress Notes (Signed)
Echocardiogram performed.  

## 2014-06-20 DIAGNOSIS — R110 Nausea: Secondary | ICD-10-CM

## 2014-06-20 DIAGNOSIS — R5383 Other fatigue: Secondary | ICD-10-CM

## 2014-06-20 DIAGNOSIS — R351 Nocturia: Secondary | ICD-10-CM | POA: Insufficient documentation

## 2014-06-20 DIAGNOSIS — K529 Noninfective gastroenteritis and colitis, unspecified: Secondary | ICD-10-CM | POA: Insufficient documentation

## 2014-06-20 DIAGNOSIS — Z8619 Personal history of other infectious and parasitic diseases: Secondary | ICD-10-CM

## 2014-06-20 DIAGNOSIS — M5136 Other intervertebral disc degeneration, lumbar region: Secondary | ICD-10-CM | POA: Insufficient documentation

## 2014-06-20 DIAGNOSIS — M545 Low back pain, unspecified: Secondary | ICD-10-CM | POA: Insufficient documentation

## 2014-06-20 DIAGNOSIS — N390 Urinary tract infection, site not specified: Secondary | ICD-10-CM

## 2014-06-20 DIAGNOSIS — M5417 Radiculopathy, lumbosacral region: Secondary | ICD-10-CM | POA: Insufficient documentation

## 2014-06-20 DIAGNOSIS — M461 Sacroiliitis, not elsewhere classified: Secondary | ICD-10-CM | POA: Insufficient documentation

## 2014-06-20 DIAGNOSIS — M51369 Other intervertebral disc degeneration, lumbar region without mention of lumbar back pain or lower extremity pain: Secondary | ICD-10-CM | POA: Insufficient documentation

## 2014-06-20 DIAGNOSIS — R81 Glycosuria: Secondary | ICD-10-CM

## 2014-06-20 HISTORY — DX: Personal history of other infectious and parasitic diseases: Z86.19

## 2014-06-20 HISTORY — DX: Urinary tract infection, site not specified: N39.0

## 2014-06-20 HISTORY — DX: Other fatigue: R53.83

## 2014-06-20 HISTORY — DX: Nausea: R11.0

## 2014-06-20 HISTORY — DX: Glycosuria: R81

## 2014-06-20 HISTORY — DX: Noninfective gastroenteritis and colitis, unspecified: K52.9

## 2014-06-20 HISTORY — DX: Low back pain, unspecified: M54.50

## 2015-09-22 DIAGNOSIS — Z973 Presence of spectacles and contact lenses: Secondary | ICD-10-CM

## 2015-09-22 DIAGNOSIS — H5360 Unspecified night blindness: Secondary | ICD-10-CM | POA: Insufficient documentation

## 2015-09-22 DIAGNOSIS — Z9289 Personal history of other medical treatment: Secondary | ICD-10-CM

## 2015-09-22 HISTORY — DX: Personal history of other medical treatment: Z92.89

## 2015-09-22 HISTORY — DX: Presence of spectacles and contact lenses: Z97.3

## 2016-05-27 DIAGNOSIS — H52223 Regular astigmatism, bilateral: Secondary | ICD-10-CM | POA: Diagnosis not present

## 2016-05-27 DIAGNOSIS — H5213 Myopia, bilateral: Secondary | ICD-10-CM | POA: Diagnosis not present

## 2016-06-13 DIAGNOSIS — J020 Streptococcal pharyngitis: Secondary | ICD-10-CM | POA: Diagnosis not present

## 2016-06-29 MED FILL — DEXTROAMP-AMPHET ER 20 MG C: 20 | 30 days supply | Qty: 30 | Fill #0

## 2016-07-07 DIAGNOSIS — F900 Attention-deficit hyperactivity disorder, predominantly inattentive type: Secondary | ICD-10-CM | POA: Diagnosis not present

## 2016-07-07 DIAGNOSIS — R05 Cough: Secondary | ICD-10-CM | POA: Diagnosis not present

## 2016-07-07 MED FILL — BENZONATATE 100 MG CAPSULE: 100 | 10 days supply | Qty: 30 | Fill #0

## 2016-07-07 MED FILL — VYVANSE 40 MG CAPSULE: 40 | 30 days supply | Qty: 30 | Fill #0

## 2016-09-06 MED FILL — SUMATRIPTAN SUCC 100 MG TAB: 100 | 30 days supply | Qty: 10 | Fill #0 | Status: TO

## 2016-10-14 DIAGNOSIS — F900 Attention-deficit hyperactivity disorder, predominantly inattentive type: Secondary | ICD-10-CM | POA: Diagnosis not present

## 2016-10-14 DIAGNOSIS — J019 Acute sinusitis, unspecified: Secondary | ICD-10-CM | POA: Diagnosis not present

## 2016-10-14 DIAGNOSIS — F419 Anxiety disorder, unspecified: Secondary | ICD-10-CM | POA: Diagnosis not present

## 2016-10-14 MED FILL — BUPROPION HCL XL 300 MG TAB: 300 | 30 days supply | Qty: 30 | Fill #0

## 2016-10-14 MED FILL — AMOXICILLIN 875 MG TABLET: 875 | 10 days supply | Qty: 20 | Fill #0

## 2016-10-14 MED FILL — BUPROPION HCL XL 150 MG TAB: 150 | 30 days supply | Qty: 30 | Fill #0

## 2016-11-12 DIAGNOSIS — Z3202 Encounter for pregnancy test, result negative: Secondary | ICD-10-CM | POA: Diagnosis not present

## 2016-11-13 ENCOUNTER — Inpatient Hospital Stay (HOSPITAL_COMMUNITY): Payer: 59

## 2016-11-13 ENCOUNTER — Inpatient Hospital Stay (HOSPITAL_COMMUNITY)
Admission: AD | Admit: 2016-11-13 | Discharge: 2016-11-13 | Disposition: A | Discharge status: Home / Self Care | Payer: 59 | Source: Ambulatory Visit | Attending: Obstetrics & Gynecology | Admitting: Obstetrics & Gynecology

## 2016-11-13 ENCOUNTER — Encounter (HOSPITAL_COMMUNITY): Payer: Self-pay

## 2016-11-13 DIAGNOSIS — R109 Unspecified abdominal pain: Secondary | ICD-10-CM | POA: Diagnosis present

## 2016-11-13 DIAGNOSIS — N946 Dysmenorrhea, unspecified: Secondary | ICD-10-CM | POA: Insufficient documentation

## 2016-11-13 HISTORY — DX: Ehlers-Danlos syndrome, unspecified: Q79.60

## 2016-11-13 LAB — HCG, QUANTITATIVE, PREGNANCY: HCG, BETA CHAIN, QUANT, S: 2 m[IU]/mL (ref <5)

## 2016-11-13 LAB — POCT PREGNANCY, URINE: Preg Test, Ur: NEGATIVE

## 2016-11-13 NOTE — MAU Note (Signed)
Positive pregnancy test at home, started vaginal bleeding this morning, cramping on right side.

## 2016-11-13 NOTE — MAU Provider Note (Signed)
History   G3P2002 early pregnant with cramping, pain is mostly on right side, all started this morning. Bleeding is sm amt watery bright bleeding.  CSN: [id number]  Arrival date & time 11/13/16  [id number]   None     Chief Complaint  Patient presents with  . Vaginal Bleeding  . Abdominal Cramping    HPI  No past medical history on file.  No past surgical history on file.  No family history on file.  Social History  Substance Use Topics  . Smoking status: Not on file  . Smokeless tobacco: Not on file  . Alcohol use Not on file    OB History    Gravida Para Term Preterm AB Living   3 2 2     2    SAB TAB Ectopic Multiple Live Births           2      Review of Systems  Constitutional: Negative.   HENT: Negative.   Eyes: Negative.   Respiratory: Negative.   Cardiovascular: Negative.   Gastrointestinal: Positive for abdominal pain.  Endocrine: Negative.   Genitourinary: Positive for vaginal bleeding.  Musculoskeletal: Negative.   Skin: Negative.     Allergies  Cephalexin  Home Medications    BP 133/79 (BP Location: Right Arm)   Pulse 93   Temp 98.4 F (36.9 C) (Oral)   Resp 16   Ht 5\' 7"  (1.702 m)   Wt 120 lb (54.4 kg)   LMP 10/13/2016   BMI 18.79 kg/m   Physical Exam  Constitutional: She is oriented to person, place, and time. She appears well-developed and well-nourished.  HENT:  Head: Normocephalic.  Eyes: Pupils are equal, round, and reactive to light.  Neck: Normal range of motion.  Cardiovascular: Normal rate, regular rhythm, normal heart sounds and intact distal pulses.   Pulmonary/Chest: Effort normal and breath sounds normal.  Abdominal: Soft.  Genitourinary: Vagina normal and uterus normal.  Genitourinary Comments: sm amt bright bleeding  Neurological: She is alert and oriented to person, place, and time. She has normal reflexes.  Skin: Skin is warm and dry.  Psychiatric: She has a normal mood and affect. Her behavior is normal. Judgment  and thought content normal.    MAU Course  Procedures (including critical care time)  Labs Reviewed  HCG, QUANTITATIVE, PREGNANCY  POCT PREGNANCY, URINE   No results found.   1. Vaginal bleeding in pregnancy, first trimester    dysmenorrhea   MDM  UPT neg, Quant neg, lengthy discussion with pt regarding POC she declines u/s at this time and desires d/c home

## 2016-11-13 NOTE — Discharge Instructions (Signed)
Dysmenorrhea Dysmenorrhea means painful cramps during your period (menstrual period). You will have pain in your lower belly (abdomen). The pain is caused by the tightening (contracting) of the muscles of the womb (uterus). The pain may be mild or very bad. With this condition, you may:  Have a headache.  Feel sick to your stomach (nauseous).  Throw up (vomit).  Have lower back pain. Follow these instructions at home: Helping pain and cramping   Put heat on your lower back or belly when you have pain or cramps. Use the heat source that your doctor tells you to use.  Place a towel between your skin and the heat.  Leave the heat on for 20-30 minutes.  Remove the heat if your skin turns bright red. This is especially important if you cannot feel pain, heat, or cold.  Do not have a heating pad on during sleep.  Do aerobic exercises. These include walking, swimming, or biking. These may help with cramps.  Massage your lower back or belly. This may help lessen pain. General instructions   Take over-the-counter and prescription medicines only as told by your doctor.  Do not drive or use heavy machinery while taking prescription pain medicine.  Avoid alcohol and caffeine during and right before your period. These can make cramps worse.  Do not use any products that have nicotine or tobacco. These include cigarettes and e-cigarettes. If you need help quitting, ask your doctor.  Keep all follow-up visits as told by your doctor. This is important. Contact a doctor if:  You have pain that gets worse.  You have pain that does not get better with medicine.  You have pain during sex.  You feel sick to your stomach or you throw up during your period, and medicine does not help. Get help right away if:  You pass out (faint). Summary  Dysmenorrhea means painful cramps during your period (menstrual period).  Put heat on your lower back or belly when you have pain or cramps.  Do  exercises like walking, swimming, or biking to help with cramps.  Contact a doctor if you have pain during sex. This information is not intended to replace advice given to you by your health care provider. Make sure you discuss any questions you have with your health care provider. Document Released: 11/05/2008 Document Revised: 08/26/2016 Document Reviewed: 08/26/2016 Elsevier Interactive Patient Education  2017 [employer]  

## 2016-11-24 MED FILL — BUPROPION HCL XL 150 MG TAB: 150 | 30 days supply | Qty: 30 | Fill #0

## 2016-11-30 MED FILL — SUMATRIPTAN SUCC 100 MG TAB: 100 | 30 days supply | Qty: 9 | Fill #0

## 2016-12-21 DIAGNOSIS — Z3009 Encounter for other general counseling and advice on contraception: Secondary | ICD-10-CM | POA: Diagnosis not present

## 2017-01-06 MED FILL — SUMATRIPTAN SUCC 100 MG TAB: 100 | 30 days supply | Qty: 9 | Fill #1

## 2017-01-11 DIAGNOSIS — F419 Anxiety disorder, unspecified: Secondary | ICD-10-CM | POA: Diagnosis not present

## 2017-01-11 DIAGNOSIS — F900 Attention-deficit hyperactivity disorder, predominantly inattentive type: Secondary | ICD-10-CM | POA: Diagnosis not present

## 2017-01-11 DIAGNOSIS — G43909 Migraine, unspecified, not intractable, without status migrainosus: Secondary | ICD-10-CM | POA: Diagnosis not present

## 2017-01-11 MED FILL — ONDANSETRON HCL 8 MG TABLET: 8 | 10 days supply | Qty: 20 | Fill #0

## 2017-01-11 MED FILL — ADDERALL XR 20 MG CAP SA: 20 | 30 days supply | Qty: 30 | Fill #0

## 2017-01-11 MED FILL — SERTRALINE HCL 50 MG TABLET: 50 | 30 days supply | Qty: 30 | Fill #0

## 2017-02-09 MED FILL — SUMATRIPTAN SUCC 100 MG TAB: 100 | 30 days supply | Qty: 9 | Fill #2

## 2017-02-14 MED FILL — ADDERALL XR 20 MG CAP SA: 20 | 30 days supply | Qty: 30 | Fill #0

## 2017-08-11 LAB — OB RESULTS CONSOLE RUBELLA ANTIBODY, IGM: Rubella: NON-IMMUNE/NOT IMMUNE

## 2017-08-11 LAB — OB RESULTS CONSOLE GC/CHLAMYDIA
Chlamydia: NEGATIVE
Gonorrhea: NEGATIVE

## 2017-08-11 LAB — OB RESULTS CONSOLE ABO/RH: RH TYPE: POSITIVE

## 2017-08-11 LAB — OB RESULTS CONSOLE RPR: RPR: NONREACTIVE

## 2017-08-11 LAB — OB RESULTS CONSOLE HEPATITIS B SURFACE ANTIGEN: Hepatitis B Surface Ag: NEGATIVE

## 2017-08-11 LAB — OB RESULTS CONSOLE HIV ANTIBODY (ROUTINE TESTING): HIV: NONREACTIVE

## 2017-08-11 LAB — OB RESULTS CONSOLE ANTIBODY SCREEN: Antibody Screen: NEGATIVE

## 2017-08-23 DIAGNOSIS — Z8759 Personal history of other complications of pregnancy, childbirth and the puerperium: Secondary | ICD-10-CM

## 2017-08-23 HISTORY — DX: Personal history of other complications of pregnancy, childbirth and the puerperium: Z87.59

## 2017-08-23 NOTE — L&D Delivery Note (Signed)
Delivery Note At 3:48 PM a viable female was delivered via Vaginal, Spontaneous (Presentation: ROA).  APGAR: 9, 9; weight pending.  Placenta status: S, I. 3V Cord with the following complications: none.  Cord pH: n/a  Anesthesia:  CLEA Episiotomy:  n/a Lacerations:  1st degree, removal of right labial skin tag Suture Repair: 3.0 vicryl rapide Est. Blood Loss (mL):  200  Mom to postpartum.  Baby to Couplet care / Skin to Skin.  Lashaunta Sicard 02/21/2018, 4:12 PM

## 2017-09-30 DIAGNOSIS — J029 Acute pharyngitis, unspecified: Secondary | ICD-10-CM

## 2017-09-30 HISTORY — DX: Acute pharyngitis, unspecified: J02.9

## 2017-10-02 ENCOUNTER — Encounter: Payer: Self-pay | Admitting: Student

## 2017-10-02 ENCOUNTER — Other Ambulatory Visit: Payer: Self-pay

## 2017-10-02 ENCOUNTER — Inpatient Hospital Stay (HOSPITAL_COMMUNITY)
Admission: AD | Admit: 2017-10-02 | Discharge: 2017-10-02 | Disposition: A | Discharge status: Home / Self Care | Payer: 59 | Source: Ambulatory Visit | Attending: Obstetrics and Gynecology | Admitting: Obstetrics and Gynecology

## 2017-10-02 DIAGNOSIS — Z3A16 16 weeks gestation of pregnancy: Secondary | ICD-10-CM | POA: Insufficient documentation

## 2017-10-02 DIAGNOSIS — O9989 Other specified diseases and conditions complicating pregnancy, childbirth and the puerperium: Secondary | ICD-10-CM | POA: Diagnosis not present

## 2017-10-02 DIAGNOSIS — Q796 Ehlers-Danlos syndrome: Secondary | ICD-10-CM | POA: Diagnosis not present

## 2017-10-02 DIAGNOSIS — O26892 Other specified pregnancy related conditions, second trimester: Secondary | ICD-10-CM | POA: Insufficient documentation

## 2017-10-02 DIAGNOSIS — R05 Cough: Secondary | ICD-10-CM | POA: Diagnosis not present

## 2017-10-02 DIAGNOSIS — H66012 Acute suppurative otitis media with spontaneous rupture of ear drum, left ear: Secondary | ICD-10-CM | POA: Diagnosis not present

## 2017-10-02 DIAGNOSIS — R51 Headache: Secondary | ICD-10-CM | POA: Diagnosis not present

## 2017-10-02 DIAGNOSIS — Z881 Allergy status to other antibiotic agents status: Secondary | ICD-10-CM | POA: Insufficient documentation

## 2017-10-02 DIAGNOSIS — R519 Headache, unspecified: Secondary | ICD-10-CM

## 2017-10-02 LAB — INFLUENZA PANEL BY PCR (TYPE A & B)
INFLAPCR: NEGATIVE
INFLBPCR: NEGATIVE

## 2017-10-02 MED ORDER — SODIUM CHLORIDE 0.9 % IV SOLN
INTRAVENOUS | Status: DC
  Administered 2017-10-02: 04:00:00 via INTRAVENOUS

## 2017-10-02 MED ORDER — AMOXICILLIN-POT CLAVULANATE 875-125 MG PO TABS
1.0000 | ORAL_TABLET | Freq: Two times a day (BID) | ORAL | Status: DC
  Administered 2017-10-02: 1 via ORAL
  Filled 2017-10-02 (×2): qty 1

## 2017-10-02 MED ORDER — DEXAMETHASONE SODIUM PHOSPHATE 10 MG/ML IJ SOLN
10.0000 mg | Freq: Once | INTRAMUSCULAR | Status: AC
  Administered 2017-10-02: 10 mg via INTRAVENOUS
  Filled 2017-10-02: qty 1

## 2017-10-02 MED ORDER — METOCLOPRAMIDE HCL 5 MG/ML IJ SOLN
10.0000 mg | Freq: Once | INTRAMUSCULAR | Status: AC
  Administered 2017-10-02: 10 mg via INTRAVENOUS
  Filled 2017-10-02: qty 2

## 2017-10-02 MED ORDER — AMOXICILLIN-POT CLAVULANATE 875-125 MG PO TABS: 1.0000 | ORAL_TABLET | Freq: Two times a day (BID) | ORAL | 0 refills | Status: DC

## 2017-10-02 MED ORDER — DIPHENHYDRAMINE HCL 50 MG/ML IJ SOLN
25.0000 mg | Freq: Once | INTRAMUSCULAR | Status: AC
  Administered 2017-10-02: 25 mg via INTRAVENOUS
  Filled 2017-10-02: qty 1

## 2017-10-02 NOTE — Discharge Instructions (Signed)
Eardrum Rupture, Adult An eardrum rupture is a hole (perforation) in the eardrum. The eardrum is a thin, round tissue inside of the ear that separates the ear canal from the middle ear. The eardrum is also called the tympanic membrane. It transfers sound vibrations through small bones in the middle ear to the hearing nerve in the inner ear. It also protects the middle ear from germs. An eardrum rupture can cause pain and hearing loss. What are the causes? This condition may be caused by:  An infection.  A sudden injury, such as from: ? Inserting a thin, sharp object into the ear. ? A hit to the side of the head, especially by an open hand. ? Falling onto water or a flat surface. ? A rapid change in pressure, such as from flying or scuba diving. ? A sudden increase in pressure against the eardrum, such as from an explosion or a very loud noise.  Inserting a cotton-tipped swab in the ear.  A long-term eustachian tube disorder. Eustachian tubes are parts of the body that connect each middle ear space to the back of the nose.  A medical procedure or surgery, such as a procedure to remove wax from the ear canal.  Removing a man-made pressure equalization tube(PE tube) that was placed through the eardrum.  Having a PE tube fall out.  What increases the risk? You are more likely to develop this condition if:  You have had PE tubes inserted in your ears.  You have an ear infection.  You play sports that: ? Involve balls or contact with other players. ? Take place in water, such as diving, scuba diving, or waterskiing.  What are the signs or symptoms? Symptoms of this condition include:  Sudden pain at the time of the injury.  Ear pain that suddenly improves.  Ringing in the ear after the injury.  Drainage from the ear. The drainage may be clear, cloudy or pus-like, or bloody.  Hearing loss.  Dizziness.  How is this diagnosed? This condition is diagnosed based on your  symptoms and medical history as well as a physical exam. Your health care provider can usually see a perforation using an ear scope (otoscope). You may have tests, such as:  A hearing test (audiogram) to check for hearing loss.  A test in which a sample of ear drainage is tested for infection (culture).  How is this treated? An eardrum typically heals on its own within a few weeks. If your eardrum does not heal, your health care provider may recommend a procedure to place a patch over your eardrum or surgery to repair your eardrum. Your health care provider may also prescribe antibiotic medicines to help prevent infection. If the ear heals completely, any hearing loss should be temporary. Follow these instructions at home:  Keep your ear dry. This is very important. Follow instructions from your health care provider about how to keep your ear dry. You may need to wear waterproof earplugs when bathing and swimming.  Take over-the-counter and prescription medicines only as told by your health care provider.  Return to sports and activities as told by your health care provider. Ask your health care provider what activities are safe for you.  Wear headgear with ear protection when you play sports in which ear injuries are common.  If directed, apply heat to your affected ear as often as told by your health care provider. Use the heat source that your health care provider recommends, such as a  moist heat pack or a heating pad. This will help to relieve pain. ? Place a towel between your skin and the heat source. ? Leave the heat on for 20-30 minutes. ? Remove the heat if your skin turns bright red. This is especially important if you are unable to feel pain, heat, or cold. You may have a greater risk of getting burned.  Keep all follow-up visits as told by your health care provider. This is important.  Talk to your health care provider before traveling by plane. Contact a health care provider  if:  You have mucus or blood draining from your ear.  You have a fever.  You have ear pain.  You have hearing loss, dizziness, or ringing in your ear. Get help right away if:  You have sudden hearing loss.  You are very dizzy.  You have severe ear pain.  Your face feels weak or becomes paralyzed. Summary  An eardrum rupture is a hole (perforation) in the eardrum that can cause pain and hearing loss. It is usually caused by a sudden injury to the ear.  The eardrum will likely heal on its own within a few weeks. In some cases, surgery may be necessary.  After the injury, follow instructions from your health care provider about how to keep your ear dry as it heals. This information is not intended to replace advice given to you by your health care provider. Make sure you discuss any questions you have with your health care provider. Document Released: 08/06/2000 Document Revised: 10/15/2016 Document Reviewed: 10/15/2016 Elsevier Interactive Patient Education  2018 [employer]. Otitis Externa Otitis externa is an infection of the outer ear canal. The outer ear canal is the area between the outside of the ear and the eardrum. Otitis externa is sometimes called "swimmer's ear." What are the causes? This condition may be caused by:  Swimming in dirty water.  Moisture in the ear.  An injury to the inside of the ear.  An object stuck in the ear.  A cut or scrape on the outside of the ear.  What increases the risk? This condition is more likely to develop in swimmers. What are the signs or symptoms? The first symptom of this condition is often itching in the ear. Later signs and symptoms include:  Swelling of the ear.  Redness in the ear.  Ear pain. The pain may get worse when you pull on your ear.  Pus coming from the ear.  How is this diagnosed? This condition may be diagnosed by examining the ear and testing fluid from the ear for bacteria and funguses. How is  this treated? This condition may be treated with:  Antibiotic ear drops. These are often given for 10-14 days.  Medicine to reduce itching and swelling.  Follow these instructions at home:  If you were prescribed antibiotic ear drops, apply them as told by your health care provider. Do not stop using the antibiotic even if your condition improves.  Take over-the-counter and prescription medicines only as told by your health care provider.  Keep all follow-up visits as told by your health care provider. This is important. How is this prevented?  Keep your ear dry. Use the corner of a towel to dry your ear after you swim or bathe.  Avoid scratching or putting things in your ear. Doing these things can damage the ear canal or remove the protective wax that lines it, which makes it easier for bacteria and funguses to grow.  Avoid swimming in lakes, polluted water, or pools that may not have the right amount of chlorine.  Consider making ear drops and putting 3 or 4 drops in each ear after you swim. Ask your health care provider about how you can make ear drops. Contact a health care provider if:  You have a fever.  After 3 days your ear is still red, swollen, painful, or draining pus.  Your redness, swelling, or pain gets worse.  You have a severe headache.  You have redness, swelling, pain, or tenderness in the area behind your ear. This information is not intended to replace advice given to you by your health care provider. Make sure you discuss any questions you have with your health care provider. Document Released: 08/09/2005 Document Revised: 09/16/2015 Document Reviewed: 05/19/2015 Elsevier Interactive Patient Education  [employer]2018 [employer]

## 2017-10-02 NOTE — Progress Notes (Signed)
Pt has fluid coming out of left ear. Pt thinks she may has "popped" her eardrum. Estanislado SpireE. Lawrence, np aware

## 2017-10-02 NOTE — MAU Provider Note (Signed)
History     CSN: [id number]  Arrival date and time: 10/02/17 0244   First Provider Initiated Contact with Patient 10/02/17 0319      Chief Complaint  Patient presents with  . Headache  . Otalgia   HPI  Alison Nichols is a 32 y.o. G3P2002 at 923w5d who presents with headache and ear pain. Symptoms began on Thursday. Went to an urgent care on Thursday and was told that she probably had an ear infection but they didn't want to give her abx d/t being pregnant. Reports left ear pain has worsened since Thursday. Headache began tonight. Feels like previous headache. Took fioricet & tylenol without relief. Has hx of preeclampsia so was concerned about her BP. Rates headache 8/10 & ear pain 9/10. Endorses body aches, non productive cough, and sore throat. States she had a negative strep culture yesterday. No sick contacts. Denies fever/chills.   OB History    Gravida Para Term Preterm AB Living   3 2 2     2    SAB TAB Ectopic Multiple Live Births           2      Past Medical History:  Diagnosis Date  . Ehlers-Danlos disease     History reviewed. No pertinent surgical history.  History reviewed. No pertinent family history.  Social History   Tobacco Use  . Smoking status: Never Smoker  . Smokeless tobacco: Never Used  Substance Use Topics  . Alcohol use: No  . Drug use: No    Allergies:  Allergies  Allergen Reactions  . Cephalexin     REACTION: hives    No medications prior to admission.    Review of Systems  Constitutional: Negative for chills and fever.  HENT: Positive for ear pain, sinus pressure, sinus pain and sore throat. Negative for ear discharge, hearing loss and tinnitus.   Respiratory: Positive for cough. Negative for shortness of breath.   Cardiovascular: Negative for chest pain.  Gastrointestinal: Negative.   Musculoskeletal: Positive for myalgias.  Neurological: Positive for headaches.   Physical Exam   Blood pressure 119/69, pulse 96,  temperature 98.2 F (36.8 C), resp. rate 18, height 5\' 7"  (1.702 m), weight 131 lb (59.4 kg), last menstrual period 10/13/2016.  Physical Exam  Nursing note and vitals reviewed. Constitutional: She is oriented to person, place, and time. She appears well-developed and well-nourished. No distress.  HENT:  Head: Normocephalic and atraumatic.  Left Ear: There is tenderness. Tympanic membrane is injected and erythematous.  Unable to visualized right TM d/t cerumen impaction.   Eyes: Conjunctivae are normal. Right eye exhibits no discharge. Left eye exhibits no discharge. No scleral icterus.  Neck: Normal range of motion.  Cardiovascular: Normal rate, regular rhythm and normal heart sounds.  No murmur heard. Respiratory: Effort normal and breath sounds normal. No respiratory distress. She has no wheezes.  Lymphadenopathy:       Head (right side): Submental and submandibular adenopathy present. No preauricular and no posterior auricular adenopathy present.       Head (left side): Submental and submandibular adenopathy present. No preauricular and no posterior auricular adenopathy present.  Neurological: She is alert and oriented to person, place, and time.  Skin: Skin is warm and dry. She is not diaphoretic.  Psychiatric: She has a normal mood and affect. Her behavior is normal. Judgment and thought content normal.    MAU Course  Procedures Results for orders placed or performed during the hospital encounter of 10/02/17 (from  the past 24 hour(s))  Influenza panel by PCR (type A & B)     Status: None   Collection Time: 10/02/17  3:32 AM  Result Value Ref Range   Influenza A By PCR NEGATIVE NEGATIVE   Influenza B By PCR NEGATIVE NEGATIVE    MDM FHT 161 Flu swab negative VSS, normotensive IV headache cocktail (decadron, reglan, benadryl) improved headache. First dose of abx given for otitis media.  C/w Dr. Rana Snare. Ok to discharge home on abx.   Prior to discharge, reports leaking from  left ear and improvement in ear pain. Rupture TM. Discussed avoiding water in ears and taking abx as prescribed.  Assessment and Plan  A: 1. Acute suppurative otitis media of left ear with spontaneous rupture of tympanic membrane, recurrence not specified   2. [redacted] weeks gestation of pregnancy   3. Pregnancy headache in second trimester    P: Discharge home Rx augmentin BID x 7 days Avoid water in ears Discussed reasons to return to MAU  Judeth Horn 10/02/2017, 3:19 AM

## 2017-10-02 NOTE — MAU Note (Signed)
Have URI. Seen at [facility]. Told have fld in ear but did not want to give antibiotics due to pregnancy. Tonight started with h/a and getting worse. Taking Benedryl and Tylenol Sinus and Fioricet and not helping. Worst h/a ever had. L ear pain

## 2017-10-02 NOTE — Progress Notes (Signed)
Written and verbal d/c instructions given and understanding voiced. 

## 2017-10-04 DIAGNOSIS — H6123 Impacted cerumen, bilateral: Secondary | ICD-10-CM

## 2017-10-04 DIAGNOSIS — H73012 Bullous myringitis, left ear: Secondary | ICD-10-CM | POA: Insufficient documentation

## 2017-10-04 HISTORY — DX: Impacted cerumen, bilateral: H61.23

## 2017-10-04 HISTORY — DX: Bullous myringitis, left ear: H73.012

## 2017-11-09 MED FILL — SERTRALINE HCL 50 MG TABLET: 50 | 30 days supply | Qty: 30 | Fill #0

## 2017-11-10 DIAGNOSIS — O26892 Other specified pregnancy related conditions, second trimester: Secondary | ICD-10-CM | POA: Diagnosis not present

## 2017-11-10 DIAGNOSIS — Z3A22 22 weeks gestation of pregnancy: Secondary | ICD-10-CM | POA: Diagnosis not present

## 2017-11-10 DIAGNOSIS — Z3400 Encounter for supervision of normal first pregnancy, unspecified trimester: Secondary | ICD-10-CM | POA: Diagnosis not present

## 2017-11-14 MED FILL — PANTOPRAZOLE SOD DR 20 MG T: 20 | 30 days supply | Qty: 30 | Fill #0

## 2017-11-15 DIAGNOSIS — H73012 Bullous myringitis, left ear: Secondary | ICD-10-CM | POA: Diagnosis not present

## 2017-11-17 DIAGNOSIS — Z3480 Encounter for supervision of other normal pregnancy, unspecified trimester: Secondary | ICD-10-CM | POA: Diagnosis not present

## 2017-11-17 MED FILL — hydrOXYzine HCL 25 MG TABS: 25 | 13 days supply | Qty: 50 | Fill #0

## 2017-12-08 DIAGNOSIS — Z23 Encounter for immunization: Secondary | ICD-10-CM | POA: Diagnosis not present

## 2017-12-08 DIAGNOSIS — Z3480 Encounter for supervision of other normal pregnancy, unspecified trimester: Secondary | ICD-10-CM | POA: Diagnosis not present

## 2017-12-08 DIAGNOSIS — O26892 Other specified pregnancy related conditions, second trimester: Secondary | ICD-10-CM | POA: Diagnosis not present

## 2017-12-08 DIAGNOSIS — Z3A26 26 weeks gestation of pregnancy: Secondary | ICD-10-CM | POA: Diagnosis not present

## 2017-12-08 DIAGNOSIS — Z3492 Encounter for supervision of normal pregnancy, unspecified, second trimester: Secondary | ICD-10-CM | POA: Diagnosis not present

## 2017-12-08 MED FILL — METHYLPREDNISOLONE 4 MG TAB: 4 | 6 days supply | Qty: 21 | Fill #0

## 2017-12-16 DIAGNOSIS — O99810 Abnormal glucose complicating pregnancy: Secondary | ICD-10-CM | POA: Diagnosis not present

## 2017-12-22 MED FILL — hydrOXYzine HCL 25 MG TABS: 25 | 13 days supply | Qty: 50 | Fill #1 | Status: TO

## 2017-12-26 DIAGNOSIS — Z3480 Encounter for supervision of other normal pregnancy, unspecified trimester: Secondary | ICD-10-CM | POA: Diagnosis not present

## 2017-12-26 MED FILL — URSODIOL 300 MG CAPSULE: 300 | 30 days supply | Qty: 60 | Fill #0

## 2018-01-09 MED FILL — SERTRALINE HCL 50 MG TABLET: 50 | 30 days supply | Qty: 30 | Fill #1

## 2018-01-11 DIAGNOSIS — Z3400 Encounter for supervision of normal first pregnancy, unspecified trimester: Secondary | ICD-10-CM | POA: Diagnosis not present

## 2018-01-11 DIAGNOSIS — L299 Pruritus, unspecified: Secondary | ICD-10-CM | POA: Diagnosis not present

## 2018-01-11 DIAGNOSIS — N760 Acute vaginitis: Secondary | ICD-10-CM | POA: Diagnosis not present

## 2018-01-24 ENCOUNTER — Other Ambulatory Visit: Payer: Self-pay

## 2018-01-24 ENCOUNTER — Inpatient Hospital Stay (HOSPITAL_COMMUNITY)
Admission: AD | Admit: 2018-01-24 | Discharge: 2018-01-26 | DRG: 831 | Disposition: A | Discharge status: Home / Self Care | Payer: 59 | Attending: Obstetrics and Gynecology | Admitting: Obstetrics and Gynecology

## 2018-01-24 ENCOUNTER — Encounter (HOSPITAL_COMMUNITY): Payer: Self-pay | Admitting: *Deleted

## 2018-01-24 ENCOUNTER — Inpatient Hospital Stay (HOSPITAL_COMMUNITY): Payer: 59

## 2018-01-24 DIAGNOSIS — O163 Unspecified maternal hypertension, third trimester: Secondary | ICD-10-CM | POA: Diagnosis not present

## 2018-01-24 DIAGNOSIS — Z3A33 33 weeks gestation of pregnancy: Secondary | ICD-10-CM

## 2018-01-24 DIAGNOSIS — R030 Elevated blood-pressure reading, without diagnosis of hypertension: Secondary | ICD-10-CM | POA: Diagnosis not present

## 2018-01-24 DIAGNOSIS — O169 Unspecified maternal hypertension, unspecified trimester: Secondary | ICD-10-CM

## 2018-01-24 DIAGNOSIS — O1413 Severe pre-eclampsia, third trimester: Secondary | ICD-10-CM | POA: Diagnosis not present

## 2018-01-24 DIAGNOSIS — Z3689 Encounter for other specified antenatal screening: Secondary | ICD-10-CM

## 2018-01-24 DIAGNOSIS — O1490 Unspecified pre-eclampsia, unspecified trimester: Secondary | ICD-10-CM

## 2018-01-24 DIAGNOSIS — Z363 Encounter for antenatal screening for malformations: Secondary | ICD-10-CM | POA: Diagnosis not present

## 2018-01-24 LAB — COMPREHENSIVE METABOLIC PANEL
ALK PHOS: 93 U/L (ref 38–126)
ALT: 16 U/L (ref 14–54)
ANION GAP: 9 (ref 5–15)
AST: 22 U/L (ref 15–41)
Albumin: 2.8 g/dL — ABNORMAL LOW (ref 3.5–5.0)
BUN: 7 mg/dL (ref 6–20)
CALCIUM: 8.6 mg/dL — AB (ref 8.9–10.3)
CO2: 17 mmol/L — ABNORMAL LOW (ref 22–32)
Chloride: 108 mmol/L (ref 101–111)
Creatinine, Ser: 0.56 mg/dL (ref 0.44–1.00)
Glucose, Bld: 122 mg/dL — ABNORMAL HIGH (ref 65–99)
Potassium: 3.4 mmol/L — ABNORMAL LOW (ref 3.5–5.1)
Sodium: 134 mmol/L — ABNORMAL LOW (ref 135–145)
TOTAL PROTEIN: 5.9 g/dL — AB (ref 6.5–8.1)
Total Bilirubin: <0.1 mg/dL — ABNORMAL LOW (ref 0.3–1.2)

## 2018-01-24 LAB — PROTEIN / CREATININE RATIO, URINE
CREATININE, URINE: 37 mg/dL
PROTEIN CREATININE RATIO: 0.24 mg/mg{creat} — AB (ref 0.00–0.15)
TOTAL PROTEIN, URINE: 9 mg/dL

## 2018-01-24 LAB — CBC
HCT: 36.8 % (ref 36.0–46.0)
HEMOGLOBIN: 12.7 g/dL (ref 12.0–15.0)
MCH: 31.2 pg (ref 26.0–34.0)
MCHC: 34.5 g/dL (ref 30.0–36.0)
MCV: 90.4 fL (ref 78.0–100.0)
Platelets: 191 10*3/uL (ref 150–400)
RBC: 4.07 MIL/uL (ref 3.87–5.11)
RDW: 12.9 % (ref 11.5–15.5)
WBC: 10.6 10*3/uL — ABNORMAL HIGH (ref 4.0–10.5)

## 2018-01-24 LAB — ABO/RH: ABO/RH(D): A POS

## 2018-01-24 LAB — TYPE AND SCREEN
ABO/RH(D): A POS
Antibody Screen: NEGATIVE

## 2018-01-24 MED ORDER — SERTRALINE HCL 50 MG PO TABS
50.0000 mg | ORAL_TABLET | Freq: Every day | ORAL | Status: DC
  Administered 2018-01-25: 50 mg via ORAL
  Filled 2018-01-24 (×3): qty 1

## 2018-01-24 MED ORDER — PRENATAL MULTIVITAMIN CH
1.0000 | ORAL_TABLET | Freq: Every day | ORAL | Status: DC
  Administered 2018-01-25: 1 via ORAL
  Filled 2018-01-24: qty 1

## 2018-01-24 MED ORDER — ACETAMINOPHEN 325 MG PO TABS
650.0000 mg | ORAL_TABLET | ORAL | Status: DC | PRN
  Administered 2018-01-24 – 2018-01-25 (×2): 650 mg via ORAL
  Filled 2018-01-24 (×2): qty 2

## 2018-01-24 MED ORDER — MAGNESIUM SULFATE BOLUS VIA INFUSION
4.0000 g | Freq: Once | INTRAVENOUS | Status: AC
  Administered 2018-01-24: 4 g via INTRAVENOUS
  Filled 2018-01-24: qty 500

## 2018-01-24 MED ORDER — CALCIUM CARBONATE ANTACID 500 MG PO CHEW
2.0000 | CHEWABLE_TABLET | ORAL | Status: DC | PRN
  Administered 2018-01-24 – 2018-01-25 (×2): 400 mg via ORAL
  Filled 2018-01-24 (×2): qty 2

## 2018-01-24 MED ORDER — DOCUSATE SODIUM 100 MG PO CAPS
100.0000 mg | ORAL_CAPSULE | Freq: Every day | ORAL | Status: DC
  Administered 2018-01-25: 100 mg via ORAL
  Filled 2018-01-24: qty 1

## 2018-01-24 MED ORDER — LACTATED RINGERS IV SOLN
INTRAVENOUS | Status: DC
  Administered 2018-01-24 – 2018-01-25 (×2): via INTRAVENOUS
  Administered 2018-01-25: 100 mL/h via INTRAVENOUS
  Administered 2018-01-25 – 2018-01-26 (×2): via INTRAVENOUS

## 2018-01-24 MED ORDER — ZOLPIDEM TARTRATE 5 MG PO TABS: 5.0000 mg | ORAL_TABLET | Freq: Every evening | ORAL | Status: DC | PRN

## 2018-01-24 MED ORDER — MAGNESIUM SULFATE 40 G IN LACTATED RINGERS - SIMPLE
2.0000 g/h | INTRAVENOUS | Status: DC
  Administered 2018-01-25 – 2018-01-26 (×2): 2 g/h via INTRAVENOUS
  Filled 2018-01-24: qty 40
  Filled 2018-01-24: qty 500
  Filled 2018-01-24: qty 40

## 2018-01-24 MED ORDER — BETAMETHASONE SOD PHOS & ACET 6 (3-3) MG/ML IJ SUSP
12.0000 mg | INTRAMUSCULAR | Status: AC
  Administered 2018-01-24 – 2018-01-25 (×2): 12 mg via INTRAMUSCULAR
  Filled 2018-01-24 (×2): qty 2

## 2018-01-24 MED ORDER — OXYCODONE HCL 5 MG PO TABS
5.0000 mg | ORAL_TABLET | Freq: Once | ORAL | Status: AC
  Administered 2018-01-24: 5 mg via ORAL
  Filled 2018-01-24: qty 1

## 2018-01-24 NOTE — MAU Provider Note (Signed)
History     CSN: [id number]  Arrival date and time: 01/24/18 1244   First Provider Initiated Contact with Patient 01/24/18 1327      Chief Complaint  Patient presents with  . Hypertension  . Headache   G5P2022 @33 .0 weeks send from office for elevated BP. Reports HA x3 not responsive to Tylenol. Rates pain 6/10. HA located on top of head. Having some blurry vision. Denies RUQ pain, CP, or SOB. Hx of pre-e in previous pregnancy.  OB History    Gravida  5   Para  2   Term  2   Preterm      AB  2   Living  2     SAB  2   TAB      Ectopic      Multiple      Live Births  2           Past Medical History:  Diagnosis Date  . Ehlers-Danlos disease     Past Surgical History:  Procedure Laterality Date  . CHOLECYSTECTOMY  2002  . HYSTEROSCOPY W/D&C  2004  . KNEE ARTHROSCOPY  1999 & 2000  . TONSILLECTOMY     w/ Adenoidectomy    History reviewed. No pertinent family history.  Social History   Tobacco Use  . Smoking status: Never Smoker  . Smokeless tobacco: Never Used  Substance Use Topics  . Alcohol use: No  . Drug use: No    Allergies:  Allergies  Allergen Reactions  . Cephalexin     REACTION: hives    Medications Prior to Admission  Medication Sig Dispense Refill Last Dose  . acetaminophen (TYLENOL) 325 MG tablet Take 650 mg by mouth every 6 (six) hours as needed.   Past Week at Unknown time  . aspirin EC 81 MG tablet Take 81 mg by mouth daily.   01/23/2018 at Unknown time  . ferrous sulfate 325 (65 FE) MG tablet Take 325 mg by mouth daily with breakfast.   Past Week at Unknown time  . hydrOXYzine (ATARAX/VISTARIL) 25 MG tablet Take 25 mg by mouth 3 (three) times daily as needed.   01/23/2018 at Unknown time  . Prenatal Vit-Fe Fumarate-FA (PRENATAL MULTIVITAMIN) TABS tablet Take 1 tablet by mouth daily at 12 noon.   01/23/2018 at Unknown time  . sertraline (ZOLOFT) 50 MG tablet Take 50 mg by mouth daily.   01/23/2018 at Unknown time  .  amoxicillin-clavulanate (AUGMENTIN) 875-125 MG tablet Take 1 tablet by mouth every 12 (twelve) hours. (Patient not taking: Reported on 01/24/2018) 14 tablet 0 Not Taking at Unknown time    Review of Systems  Eyes: Positive for visual disturbance.  Respiratory: Negative for shortness of breath.   Cardiovascular: Negative for chest pain.  Gastrointestinal: Negative for abdominal pain.  Genitourinary: Negative for vaginal bleeding and vaginal discharge.  Neurological: Positive for headaches.   Physical Exam   Blood pressure 123/81, pulse (!) 105, temperature 98.7 F (37.1 C), temperature source Oral, resp. rate 20, weight 155 lb (70.3 kg), last menstrual period 10/13/2016, SpO2 100 %. Patient Vitals for the past 24 hrs:  BP Temp Temp src Pulse Resp SpO2 Weight  01/24/18 1500 123/81 - - (!) 105 - - -  01/24/18 1445 132/68 - - (!) 116 - - -  01/24/18 1430 119/75 - - (!) 109 - - -  01/24/18 1415 134/72 - - (!) 115 - - -  01/24/18 1400 131/85 - - (!) 121 - - -  01/24/18 1345 132/79 - - (!) 121 - - -  01/24/18 1330 133/89 - - (!) 113 - - -  01/24/18 1315 (!) 136/91 - - (!) 120 - - -  01/24/18 1313 135/79 - - (!) 125 - - -  01/24/18 1257 (!) 139/91 98.7 F (37.1 C) Oral (!) 125 20 100 % 155 lb (70.3 kg)    Physical Exam  Constitutional: She is oriented to person, place, and time. She appears well-developed and well-nourished. No distress.  HENT:  Head: Normocephalic and atraumatic.  Neck: Normal range of motion.  Respiratory: Effort normal. No respiratory distress.  GI: Soft. She exhibits no distension. There is no tenderness.  gravid  Musculoskeletal: Normal range of motion. She exhibits no edema.  Neurological: She is alert and oriented to person, place, and time. She has normal reflexes. She displays normal reflexes.  Skin: Skin is warm and dry.  Psychiatric: She has a normal mood and affect.  EFM: 155 bpm, mod variability, + accels, no decels Toco: irritability  Results for  orders placed or performed during the hospital encounter of 01/24/18 (from the past 24 hour(s))  Protein / creatinine ratio, urine     Status: Abnormal   Collection Time: 01/24/18  1:00 PM  Result Value Ref Range   Creatinine, Urine 37.00 mg/dL   Total Protein, Urine 9 mg/dL   Protein Creatinine Ratio 0.24 (H) 0.00 - 0.15 mg/mg[Cre]  CBC     Status: Abnormal   Collection Time: 01/24/18  1:21 PM  Result Value Ref Range   WBC 10.6 (H) 4.0 - 10.5 K/uL   RBC 4.07 3.87 - 5.11 MIL/uL   Hemoglobin 12.7 12.0 - 15.0 g/dL   HCT 16.1 09.6 - 04.5 %   MCV 90.4 78.0 - 100.0 fL   MCH 31.2 26.0 - 34.0 pg   MCHC 34.5 30.0 - 36.0 g/dL   RDW 40.9 81.1 - 91.4 %   Platelets 191 150 - 400 K/uL  Comprehensive metabolic panel     Status: Abnormal   Collection Time: 01/24/18  1:21 PM  Result Value Ref Range   Sodium 134 (L) 135 - 145 mmol/L   Potassium 3.4 (L) 3.5 - 5.1 mmol/L   Chloride 108 101 - 111 mmol/L   CO2 17 (L) 22 - 32 mmol/L   Glucose, Bld 122 (H) 65 - 99 mg/dL   BUN 7 6 - 20 mg/dL   Creatinine, Ser 7.82 0.44 - 1.00 mg/dL   Calcium 8.6 (L) 8.9 - 10.3 mg/dL   Total Protein 5.9 (L) 6.5 - 8.1 g/dL   Albumin 2.8 (L) 3.5 - 5.0 g/dL   AST 22 15 - 41 U/L   ALT 16 14 - 54 U/L   Alkaline Phosphatase 93 38 - 126 U/L   Total Bilirubin <0.1 (L) 0.3 - 1.2 mg/dL   GFR calc non Af Amer >60 >60 mL/min   GFR calc Af Amer >60 >60 mL/min   Anion gap 9 5 - 15   MAU Course  Procedures  MDM Labs ordered and reviewed. No improvement in HA after meds. Presentation, clinical findings, and plan discussed with Dr. Henderson Cloud. Plan for admit.  Assessment and Plan  [redacted] weeks gestation Reactive NST Preeclampsia with severe features Admit to [facility] per Dr. Su Hoff, CNM 01/24/2018, 3:04 PM

## 2018-01-24 NOTE — MAU Note (Signed)
bp has been creeping, today was 140/90.  Has been having HA and increased swelling. No relief with Tylenol or Fioricet. Had Pre eclampsia with last preg.

## 2018-01-24 NOTE — H&P (Signed)
[id number] @33 .0 weeks send from office for elevated BP. Reports HA x3 not responsive to Tylenol. Rates pain 6/10. HA located on top of head. Having some blurry vision. Denies RUQ pain, CP, or SOB. Hx of pre-e in previous pregnancy.          OB History    Gravida  5   Para  2   Term  2   Preterm      AB  2   Living  2     SAB  2   TAB      Ectopic      Multiple      Live Births  2               Past Medical History:  Diagnosis Date  . Ehlers-Danlos disease          Past Surgical History:  Procedure Laterality Date  . CHOLECYSTECTOMY  2002  . HYSTEROSCOPY W/D&C  2004  . KNEE ARTHROSCOPY  1999 & 2000  . TONSILLECTOMY     w/ Adenoidectomy    History reviewed. No pertinent family history.  Social History       Tobacco Use  . Smoking status: Never Smoker  . Smokeless tobacco: Never Used  Substance Use Topics  . Alcohol use: No  . Drug use: No    Allergies:       Allergies  Allergen Reactions  . Cephalexin     REACTION: hives           Medications Prior to Admission  Medication Sig Dispense Refill Last Dose  . acetaminophen (TYLENOL) 325 MG tablet Take 650 mg by mouth every 6 (six) hours as needed.   Past Week at Unknown time  . aspirin EC 81 MG tablet Take 81 mg by mouth daily.   01/23/2018 at Unknown time  . ferrous sulfate 325 (65 FE) MG tablet Take 325 mg by mouth daily with breakfast.   Past Week at Unknown time  . hydrOXYzine (ATARAX/VISTARIL) 25 MG tablet Take 25 mg by mouth 3 (three) times daily as needed.   01/23/2018 at Unknown time  . Prenatal Vit-Fe Fumarate-FA (PRENATAL MULTIVITAMIN) TABS tablet Take 1 tablet by mouth daily at 12 noon.   01/23/2018 at Unknown time  . sertraline (ZOLOFT) 50 MG tablet Take 50 mg by mouth daily.   01/23/2018 at Unknown time  . amoxicillin-clavulanate (AUGMENTIN) 875-125 MG tablet Take 1 tablet by mouth every 12 (twelve) hours. (Patient not taking: Reported on 01/24/2018) 14  tablet 0 Not Taking at Unknown time    Review of Systems  Eyes: Positive for visual disturbance.  Respiratory: Negative for shortness of breath.   Cardiovascular: Negative for chest pain.  Gastrointestinal: Negative for abdominal pain.  Genitourinary: Negative for vaginal bleeding and vaginal discharge.  Neurological: Positive for headaches.   Physical Exam   Blood pressure 123/81, pulse (!) 105, temperature 98.7 F (37.1 C), temperature source Oral, resp. rate 20, weight 155 lb (70.3 kg), last menstrual period 10/13/2016, SpO2 100 %. Patient Vitals for the past 24 hrs:  BP Temp Temp src Pulse Resp SpO2 Weight  01/24/18 1500 123/81 - - (!) 105 - - -  01/24/18 1445 132/68 - - (!) 116 - - -  01/24/18 1430 119/75 - - (!) 109 - - -  01/24/18 1415 134/72 - - (!) 115 - - -  01/24/18 1400 131/85 - - (!) 121 - - -  01/24/18 1345 132/79 - - (!) 121 - - -  01/24/18 1330 133/89 - - (!) 113 - - -  01/24/18 1315 (!) 136/91 - - (!) 120 - - -  01/24/18 1313 135/79 - - (!) 125 - - -  01/24/18 1257 (!) 139/91 98.7 F (37.1 C) Oral (!) 125 20 100 % 155 lb (70.3 kg)    Physical Exam  Constitutional: She is oriented to person, place, and time. She appears well-developed and well-nourished. No distress.  HENT:  Head: Normocephalic and atraumatic.  Neck: Normal range of motion.  Respiratory: Effort normal. No respiratory distress.  GI: Soft. She exhibits no distension. There is no tenderness.  gravid  Musculoskeletal: Normal range of motion. She exhibits no edema.  Neurological: She is alert and oriented to person, place, and time. She has normal reflexes. She displays normal reflexes.  Skin: Skin is warm and dry.  Psychiatric: She has a normal mood and affect.  EFM: 155 bpm, mod variability, + accels, no decels Toco: irritability  LabResultsLast24Hours  Results for orders placed or performed during the hospital encounter of 01/24/18 (from the past 24 hour(s))  Protein /  creatinine ratio, urine     Status: Abnormal   Collection Time: 01/24/18  1:00 PM  Result Value Ref Range   Creatinine, Urine 37.00 mg/dL   Total Protein, Urine 9 mg/dL   Protein Creatinine Ratio 0.24 (H) 0.00 - 0.15 mg/mg[Cre]  CBC     Status: Abnormal   Collection Time: 01/24/18  1:21 PM  Result Value Ref Range   WBC 10.6 (H) 4.0 - 10.5 K/uL   RBC 4.07 3.87 - 5.11 MIL/uL   Hemoglobin 12.7 12.0 - 15.0 g/dL   HCT 60.4 54.0 - 98.1 %   MCV 90.4 78.0 - 100.0 fL   MCH 31.2 26.0 - 34.0 pg   MCHC 34.5 30.0 - 36.0 g/dL   RDW 19.1 47.8 - 29.5 %   Platelets 191 150 - 400 K/uL  Comprehensive metabolic panel     Status: Abnormal   Collection Time: 01/24/18  1:21 PM  Result Value Ref Range   Sodium 134 (L) 135 - 145 mmol/L   Potassium 3.4 (L) 3.5 - 5.1 mmol/L   Chloride 108 101 - 111 mmol/L   CO2 17 (L) 22 - 32 mmol/L   Glucose, Bld 122 (H) 65 - 99 mg/dL   BUN 7 6 - 20 mg/dL   Creatinine, Ser 6.21 0.44 - 1.00 mg/dL   Calcium 8.6 (L) 8.9 - 10.3 mg/dL   Total Protein 5.9 (L) 6.5 - 8.1 g/dL   Albumin 2.8 (L) 3.5 - 5.0 g/dL   AST 22 15 - 41 U/L   ALT 16 14 - 54 U/L   Alkaline Phosphatase 93 38 - 126 U/L   Total Bilirubin <0.1 (L) 0.3 - 1.2 mg/dL   GFR calc non Af Amer >60 >60 mL/min   GFR calc Af Amer >60 >60 mL/min   Anion gap 9 5 - 15     MAU Course  Procedures  MDM Labs ordered and reviewed. No improvement in HA after meds. Presentation, clinical findings, and plan discussed with Dr. Henderson Cloud. Plan for admit.  Assessment and Plan  [redacted] weeks gestation Reactive NST Preeclampsia with severe features Admit to [facility] per Dr. Su Hoff, CNM 01/24/2018, 3:04 PM   Agree Patient still has some HA with visual disturbance (magnesium bolus just finished)  Vitals:   01/24/18 1711 01/24/18 1723  BP: 117/70 121/70  Pulse: 100 (!) 106  Resp: 18 18  Temp:    SpO2:     Lungs CTA Cor RRR  Abdomen no epigastric tenderness DTR 2+  without clonus  FHT cat one UCs q2-5 min  Results for orders placed or performed during the hospital encounter of 01/24/18 (from the past 24 hour(s))  Protein / creatinine ratio, urine     Status: Abnormal   Collection Time: 01/24/18  1:00 PM  Result Value Ref Range   Creatinine, Urine 37.00 mg/dL   Total Protein, Urine 9 mg/dL   Protein Creatinine Ratio 0.24 (H) 0.00 - 0.15 mg/mg[Cre]  CBC     Status: Abnormal   Collection Time: 01/24/18  1:21 PM  Result Value Ref Range   WBC 10.6 (H) 4.0 - 10.5 K/uL   RBC 4.07 3.87 - 5.11 MIL/uL   Hemoglobin 12.7 12.0 - 15.0 g/dL   HCT 16.1 09.6 - 04.5 %   MCV 90.4 78.0 - 100.0 fL   MCH 31.2 26.0 - 34.0 pg   MCHC 34.5 30.0 - 36.0 g/dL   RDW 40.9 81.1 - 91.4 %   Platelets 191 150 - 400 K/uL  Comprehensive metabolic panel     Status: Abnormal   Collection Time: 01/24/18  1:21 PM  Result Value Ref Range   Sodium 134 (L) 135 - 145 mmol/L   Potassium 3.4 (L) 3.5 - 5.1 mmol/L   Chloride 108 101 - 111 mmol/L   CO2 17 (L) 22 - 32 mmol/L   Glucose, Bld 122 (H) 65 - 99 mg/dL   BUN 7 6 - 20 mg/dL   Creatinine, Ser 7.82 0.44 - 1.00 mg/dL   Calcium 8.6 (L) 8.9 - 10.3 mg/dL   Total Protein 5.9 (L) 6.5 - 8.1 g/dL   Albumin 2.8 (L) 3.5 - 5.0 g/dL   AST 22 15 - 41 U/L   ALT 16 14 - 54 U/L   Alkaline Phosphatase 93 38 - 126 U/L   Total Bilirubin <0.1 (L) 0.3 - 1.2 mg/dL   GFR calc non Af Amer >60 >60 mL/min   GFR calc Af Amer >60 >60 mL/min   Anion gap 9 5 - 15  Type and screen [facility]     Status: None   Collection Time: 01/24/18  3:45 PM  Result Value Ref Range   ABO/RH(D) A POS    Antibody Screen NEG    Sample Expiration      01/27/2018 Performed at [facility], [address], [place], [place] [address]    A/P: Preeclampsia with severe features @ 33 0/7 weeks         Hx of 36 week delivery x 2-now with some UCs         BMTZ #1 done         Magnesium infusing         Repeat labs in am         Continuous  monitoring         Watch UCs, will increase magnesium if needed         D/W patient magnesium infusion at least until both BMTZ in then reevaluate

## 2018-01-25 LAB — CBC
HEMATOCRIT: 35.1 % — AB (ref 36.0–46.0)
HEMOGLOBIN: 12 g/dL (ref 12.0–15.0)
MCH: 31.2 pg (ref 26.0–34.0)
MCHC: 34.2 g/dL (ref 30.0–36.0)
MCV: 91.2 fL (ref 78.0–100.0)
Platelets: 183 10*3/uL (ref 150–400)
RBC: 3.85 MIL/uL — AB (ref 3.87–5.11)
RDW: 13 % (ref 11.5–15.5)
WBC: 14 10*3/uL — AB (ref 4.0–10.5)

## 2018-01-25 LAB — COMPREHENSIVE METABOLIC PANEL
ALT: 17 U/L (ref 14–54)
ANION GAP: 10 (ref 5–15)
AST: 23 U/L (ref 15–41)
Albumin: 2.7 g/dL — ABNORMAL LOW (ref 3.5–5.0)
Alkaline Phosphatase: 89 U/L (ref 38–126)
BILIRUBIN TOTAL: 0.4 mg/dL (ref 0.3–1.2)
BUN: <5 mg/dL — ABNORMAL LOW (ref 6–20)
CO2: 17 mmol/L — ABNORMAL LOW (ref 22–32)
Calcium: 7.3 mg/dL — ABNORMAL LOW (ref 8.9–10.3)
Chloride: 106 mmol/L (ref 101–111)
Creatinine, Ser: 0.54 mg/dL (ref 0.44–1.00)
GFR calc Af Amer: >60 mL/min (ref >60)
Glucose, Bld: 126 mg/dL — ABNORMAL HIGH (ref 65–99)
POTASSIUM: 3.5 mmol/L (ref 3.5–5.1)
Sodium: 133 mmol/L — ABNORMAL LOW (ref 135–145)
TOTAL PROTEIN: 5.9 g/dL — AB (ref 6.5–8.1)

## 2018-01-25 LAB — MAGNESIUM: MAGNESIUM: 4.8 mg/dL — AB (ref 1.7–2.4)

## 2018-01-25 MED ORDER — BUTALBITAL-APAP-CAFFEINE 50-325-40 MG PO TABS
1.0000 | ORAL_TABLET | Freq: Four times a day (QID) | ORAL | Status: DC | PRN
  Administered 2018-01-25 – 2018-01-26 (×2): 1 via ORAL
  Filled 2018-01-25 (×2): qty 1

## 2018-01-25 MED ORDER — BUTALBITAL-APAP-CAFFEINE 50-325-40 MG PO TABS
2.0000 | ORAL_TABLET | Freq: Once | ORAL | Status: AC
  Administered 2018-01-25: 2 via ORAL
  Filled 2018-01-25: qty 2

## 2018-01-25 MED ORDER — OXYCODONE-ACETAMINOPHEN 5-325 MG PO TABS
1.0000 | ORAL_TABLET | Freq: Four times a day (QID) | ORAL | Status: DC | PRN
  Administered 2018-01-25: 1 via ORAL
  Filled 2018-01-25: qty 1

## 2018-01-25 NOTE — Progress Notes (Signed)
Complains of HA which has not responded to Tylenol or Percocet.  Feels like her typical migraines which respond well to Fioricet.  Active FM.  No RUQ pain or CP/SOB.  On Mag 2g/hr.  VSS. BP wnl.  4.5 L UOP since admit Pre-eclampsia labs WNL.  P:C 0.24 FHT Cat I  Gen: A&O x 3 Abd: soft, NT Ext: no c/c/e  31yo [id number] at 5639w1d -BMZ #2 due today -Monitor BPs -Continue magnesium until BMZ mature -Fioricet prn HA -MFM u/s today  Mitchel HonourMegan Broghan Pannone, DO

## 2018-01-25 NOTE — Progress Notes (Signed)
HA improved with Fioricet.  PRN order placed. Patient reports occasional runs of CTX.  CVX cl/50/-3

## 2018-01-26 NOTE — Discharge Summary (Signed)
Physician Discharge Summary  Patient ID: Shawna OrleansMeredith B Daughtridge MRN: [id number] DOB/AGE: Oct 30, 1985 32 y.o.  Admit date: 01/24/2018 Discharge date: 01/26/2018  Admission Diagnoses:  Pre eclampsia, preterm contracitons  Discharge Diagnoses:  Active Problems:   Preeclampsia, severe, third trimester   Discharged Condition: stable  Hospital Course: pt admitted with elevated BP.  BMZ and magnesium given.  Few episodes of preterm contractions but cervix closed on exam.  Fetal monitoring reassuring.  BP remained stable off magnesium and stable for discharge with close follow up as outpatient  Consults: None  Significant Diagnostic Studies: labs: cbc, cmet  Treatments: fetal monitoring  Discharge Exam: Blood pressure 110/60, pulse 87, temperature 99.9 F (37.7 C), temperature source Oral, resp. rate 18, height 5\' 7"  (1.702 m), weight 155 lb (70.3 kg), last menstrual period 10/13/2016, SpO2 100 %. General appearance: alert and cooperative GI: gravid, NT  Disposition: Discharge disposition: 01-Home or Self Care         Follow-up Information    [place] Schedule an appointment as soon as possible for a visit in 4 day(s).   Contact information: [address] [place] [id number] [phone]           Signed: Zelphia CairoGretchen Julia Alkhatib 01/26/2018, 10:03 AM

## 2018-01-26 NOTE — Progress Notes (Signed)
Pt feeling better today.  Mild ctx overnight, have resolved.  No n/v or visual changes.  Mild HA, improved w/ fioricet  AF, VSS BP 110-120s/50-60s Gen - NAD Abd - gravid, NT  A/P:  33+2 wks w/ pre eclampsia & preterm ctx S/p BMZ D/c magnesium Plan d/c this afternoon if BP ok off magnesium

## 2018-01-26 NOTE — Progress Notes (Signed)
Pt discharged with husband. Patient given printed discharge instructions. Pt verbalized an understanding. No concern noted. Carmelina DaneERRI L Cem Kosman, RN

## 2018-01-31 DIAGNOSIS — Z3480 Encounter for supervision of other normal pregnancy, unspecified trimester: Secondary | ICD-10-CM | POA: Diagnosis not present

## 2018-01-31 DIAGNOSIS — Z113 Encounter for screening for infections with a predominantly sexual mode of transmission: Secondary | ICD-10-CM | POA: Diagnosis not present

## 2018-01-31 DIAGNOSIS — Z3493 Encounter for supervision of normal pregnancy, unspecified, third trimester: Secondary | ICD-10-CM | POA: Diagnosis not present

## 2018-02-02 DIAGNOSIS — Z3480 Encounter for supervision of other normal pregnancy, unspecified trimester: Secondary | ICD-10-CM | POA: Diagnosis not present

## 2018-02-03 DIAGNOSIS — Z3483 Encounter for supervision of other normal pregnancy, third trimester: Secondary | ICD-10-CM | POA: Diagnosis not present

## 2018-02-03 DIAGNOSIS — Z3482 Encounter for supervision of other normal pregnancy, second trimester: Secondary | ICD-10-CM | POA: Diagnosis not present

## 2018-02-07 MED FILL — hydrOXYzine HCL 25 MG TABS: 25 | 13 days supply | Qty: 50 | Fill #0

## 2018-02-09 DIAGNOSIS — Z3480 Encounter for supervision of other normal pregnancy, unspecified trimester: Secondary | ICD-10-CM | POA: Diagnosis not present

## 2018-02-15 DIAGNOSIS — Z13228 Encounter for screening for other metabolic disorders: Secondary | ICD-10-CM | POA: Diagnosis not present

## 2018-02-15 DIAGNOSIS — Z3400 Encounter for supervision of normal first pregnancy, unspecified trimester: Secondary | ICD-10-CM | POA: Diagnosis not present

## 2018-02-15 DIAGNOSIS — Z130 Encounter for screening for diseases of the blood and blood-forming organs and certain disorders involving the immune mechanism: Secondary | ICD-10-CM | POA: Diagnosis not present

## 2018-02-15 DIAGNOSIS — Z113 Encounter for screening for infections with a predominantly sexual mode of transmission: Secondary | ICD-10-CM | POA: Diagnosis not present

## 2018-02-20 ENCOUNTER — Telehealth (HOSPITAL_COMMUNITY): Payer: Self-pay | Admitting: *Deleted

## 2018-02-20 ENCOUNTER — Encounter (HOSPITAL_COMMUNITY): Payer: Self-pay | Admitting: *Deleted

## 2018-02-20 NOTE — Telephone Encounter (Signed)
Preadmission screen  

## 2018-02-21 ENCOUNTER — Other Ambulatory Visit: Payer: Self-pay

## 2018-02-21 ENCOUNTER — Inpatient Hospital Stay (HOSPITAL_COMMUNITY): Payer: 59 | Admitting: Anesthesiology

## 2018-02-21 ENCOUNTER — Inpatient Hospital Stay (HOSPITAL_COMMUNITY)
Admission: RE | Admit: 2018-02-21 | Discharge: 2018-02-23 | DRG: 768 | Disposition: A | Discharge status: Home / Self Care | Payer: 59 | Attending: Obstetrics & Gynecology | Admitting: Obstetrics & Gynecology

## 2018-02-21 ENCOUNTER — Encounter (HOSPITAL_COMMUNITY): Payer: Self-pay

## 2018-02-21 VITALS — BP 127/84 | HR 76 | Temp 98.1°F | Resp 20 | Ht 67.0 in | Wt 156.0 lb

## 2018-02-21 DIAGNOSIS — Z7982 Long term (current) use of aspirin: Secondary | ICD-10-CM

## 2018-02-21 DIAGNOSIS — O9952 Diseases of the respiratory system complicating childbirth: Secondary | ICD-10-CM | POA: Diagnosis present

## 2018-02-21 DIAGNOSIS — F419 Anxiety disorder, unspecified: Secondary | ICD-10-CM | POA: Diagnosis present

## 2018-02-21 DIAGNOSIS — Z3A37 37 weeks gestation of pregnancy: Secondary | ICD-10-CM | POA: Diagnosis not present

## 2018-02-21 DIAGNOSIS — O1413 Severe pre-eclampsia, third trimester: Secondary | ICD-10-CM

## 2018-02-21 DIAGNOSIS — L918 Other hypertrophic disorders of the skin: Secondary | ICD-10-CM | POA: Diagnosis present

## 2018-02-21 DIAGNOSIS — O139 Gestational [pregnancy-induced] hypertension without significant proteinuria, unspecified trimester: Secondary | ICD-10-CM | POA: Diagnosis present

## 2018-02-21 DIAGNOSIS — Q796 Ehlers-Danlos syndrome: Secondary | ICD-10-CM

## 2018-02-21 DIAGNOSIS — J45909 Unspecified asthma, uncomplicated: Secondary | ICD-10-CM | POA: Diagnosis present

## 2018-02-21 DIAGNOSIS — O8612 Endometritis following delivery: Secondary | ICD-10-CM | POA: Diagnosis present

## 2018-02-21 DIAGNOSIS — O134 Gestational [pregnancy-induced] hypertension without significant proteinuria, complicating childbirth: Principal | ICD-10-CM | POA: Diagnosis present

## 2018-02-21 DIAGNOSIS — O99344 Other mental disorders complicating childbirth: Secondary | ICD-10-CM | POA: Diagnosis present

## 2018-02-21 DIAGNOSIS — O1494 Unspecified pre-eclampsia, complicating childbirth: Secondary | ICD-10-CM | POA: Diagnosis not present

## 2018-02-21 HISTORY — DX: Scoliosis, unspecified: M41.9

## 2018-02-21 LAB — COMPREHENSIVE METABOLIC PANEL
ALBUMIN: 2.9 g/dL — AB (ref 3.5–5.0)
ALK PHOS: 123 U/L (ref 38–126)
ALT: 10 U/L (ref 0–44)
ANION GAP: 12 (ref 5–15)
AST: 24 U/L (ref 15–41)
BILIRUBIN TOTAL: 0.4 mg/dL (ref 0.3–1.2)
BUN: 12 mg/dL (ref 6–20)
CALCIUM: 9.7 mg/dL (ref 8.9–10.3)
CO2: 17 mmol/L — ABNORMAL LOW (ref 22–32)
Chloride: 104 mmol/L (ref 98–111)
Creatinine, Ser: 0.58 mg/dL (ref 0.44–1.00)
GFR calc Af Amer: >60 mL/min (ref >60)
GFR calc non Af Amer: >60 mL/min (ref >60)
GLUCOSE: 104 mg/dL — AB (ref 70–99)
Potassium: 3.5 mmol/L (ref 3.5–5.1)
Sodium: 133 mmol/L — ABNORMAL LOW (ref 135–145)
TOTAL PROTEIN: 6 g/dL — AB (ref 6.5–8.1)

## 2018-02-21 LAB — CBC
HEMATOCRIT: 37 % (ref 36.0–46.0)
HEMATOCRIT: 33 % — AB (ref 36.0–46.0)
HEMOGLOBIN: 11.3 g/dL — AB (ref 12.0–15.0)
Hemoglobin: 12.6 g/dL (ref 12.0–15.0)
MCH: 30.9 pg (ref 26.0–34.0)
MCH: 31.2 pg (ref 26.0–34.0)
MCHC: 34.1 g/dL (ref 30.0–36.0)
MCHC: 34.2 g/dL (ref 30.0–36.0)
MCV: 90.7 fL (ref 78.0–100.0)
MCV: 91.2 fL (ref 78.0–100.0)
Platelets: 154 10*3/uL (ref 150–400)
Platelets: 140 10*3/uL — ABNORMAL LOW (ref 150–400)
RBC: 4.08 MIL/uL (ref 3.87–5.11)
RBC: 3.62 MIL/uL — ABNORMAL LOW (ref 3.87–5.11)
RDW: 13.4 % (ref 11.5–15.5)
RDW: 13.6 % (ref 11.5–15.5)
WBC: 9.8 10*3/uL (ref 4.0–10.5)
WBC: 9.1 10*3/uL (ref 4.0–10.5)

## 2018-02-21 LAB — TYPE AND SCREEN
ABO/RH(D): A POS
ANTIBODY SCREEN: NEGATIVE

## 2018-02-21 LAB — RPR: RPR Ser Ql: NONREACTIVE

## 2018-02-21 MED ORDER — SOD CITRATE-CITRIC ACID 500-334 MG/5ML PO SOLN: 30.0000 mL | ORAL | Status: DC | PRN

## 2018-02-21 MED ORDER — SODIUM CHLORIDE 0.9 % IV SOLN
INTRAVENOUS | Status: DC | PRN
  Administered 2018-02-21: 250 mL via INTRAVENOUS

## 2018-02-21 MED ORDER — OXYCODONE-ACETAMINOPHEN 5-325 MG PO TABS: 2.0000 | ORAL_TABLET | ORAL | Status: DC | PRN

## 2018-02-21 MED ORDER — LACTATED RINGERS IV SOLN
INTRAVENOUS | Status: DC
  Administered 2018-02-21 (×2): via INTRAVENOUS

## 2018-02-21 MED ORDER — TERBUTALINE SULFATE 1 MG/ML IJ SOLN
0.2500 mg | Freq: Once | INTRAMUSCULAR | Status: DC | PRN
  Filled 2018-02-21: qty 1

## 2018-02-21 MED ORDER — IBUPROFEN 600 MG PO TABS
600.0000 mg | ORAL_TABLET | Freq: Four times a day (QID) | ORAL | Status: DC
  Administered 2018-02-21 – 2018-02-23 (×8): 600 mg via ORAL
  Filled 2018-02-21 (×8): qty 1

## 2018-02-21 MED ORDER — TETANUS-DIPHTH-ACELL PERTUSSIS 5-2.5-18.5 LF-MCG/0.5 IM SUSP: 0.5000 mL | Freq: Once | INTRAMUSCULAR | Status: DC

## 2018-02-21 MED ORDER — DIPHENHYDRAMINE HCL 50 MG/ML IJ SOLN
12.5000 mg | INTRAMUSCULAR | Status: AC | PRN
  Administered 2018-02-21 (×3): 12.5 mg via INTRAVENOUS
  Filled 2018-02-21: qty 1

## 2018-02-21 MED ORDER — ACETAMINOPHEN 500 MG PO TABS
1000.0000 mg | ORAL_TABLET | Freq: Once | ORAL | Status: AC
  Administered 2018-02-21: 1000 mg via ORAL
  Filled 2018-02-21: qty 2

## 2018-02-21 MED ORDER — LACTATED RINGERS IV SOLN: 500.0000 mL | INTRAVENOUS | Status: DC | PRN

## 2018-02-21 MED ORDER — ONDANSETRON HCL 4 MG/2ML IJ SOLN: 4.0000 mg | INTRAMUSCULAR | Status: DC | PRN

## 2018-02-21 MED ORDER — SERTRALINE HCL 50 MG PO TABS
50.0000 mg | ORAL_TABLET | Freq: Every day | ORAL | Status: DC
  Administered 2018-02-22 – 2018-02-23 (×2): 50 mg via ORAL
  Filled 2018-02-21 (×4): qty 1

## 2018-02-21 MED ORDER — OXYCODONE-ACETAMINOPHEN 5-325 MG PO TABS: 1.0000 | ORAL_TABLET | ORAL | Status: DC | PRN

## 2018-02-21 MED ORDER — DIPHENHYDRAMINE HCL 25 MG PO CAPS: 25.0000 mg | ORAL_CAPSULE | Freq: Four times a day (QID) | ORAL | Status: DC | PRN

## 2018-02-21 MED ORDER — ACETAMINOPHEN 325 MG PO TABS: 650.0000 mg | ORAL_TABLET | ORAL | Status: DC | PRN

## 2018-02-21 MED ORDER — COCONUT OIL OIL
1.0000 "application " | TOPICAL_OIL | Status: DC | PRN
  Administered 2018-02-22: 1 via TOPICAL
  Filled 2018-02-21: qty 120

## 2018-02-21 MED ORDER — OXYTOCIN BOLUS FROM INFUSION
500.0000 mL | Freq: Once | INTRAVENOUS | Status: AC
  Administered 2018-02-21: 500 mL via INTRAVENOUS

## 2018-02-21 MED ORDER — PRENATAL MULTIVITAMIN CH
1.0000 | ORAL_TABLET | Freq: Every day | ORAL | Status: DC
  Administered 2018-02-22 – 2018-02-23 (×2): 1 via ORAL
  Filled 2018-02-21 (×2): qty 1

## 2018-02-21 MED ORDER — ONDANSETRON HCL 4 MG PO TABS: 4.0000 mg | ORAL_TABLET | ORAL | Status: DC | PRN

## 2018-02-21 MED ORDER — ONDANSETRON HCL 4 MG/2ML IJ SOLN: 4.0000 mg | Freq: Four times a day (QID) | INTRAMUSCULAR | Status: DC | PRN

## 2018-02-21 MED ORDER — FLEET ENEMA 7-19 GM/118ML RE ENEM: 1.0000 | ENEMA | RECTAL | Status: DC | PRN

## 2018-02-21 MED ORDER — LIDOCAINE HCL (PF) 1 % IJ SOLN
INTRAMUSCULAR | Status: DC | PRN
Start: 2018-02-21 — End: 2018-02-21
  Administered 2018-02-21: 4 mL via EPIDURAL
  Administered 2018-02-21: 6 mL via EPIDURAL

## 2018-02-21 MED ORDER — LIDOCAINE HCL (PF) 1 % IJ SOLN
30.0000 mL | INTRAMUSCULAR | Status: AC | PRN
  Administered 2018-02-21: 30 mL via SUBCUTANEOUS
  Filled 2018-02-21: qty 30

## 2018-02-21 MED ORDER — DIBUCAINE 1 % RE OINT: 1.0000 "application " | TOPICAL_OINTMENT | RECTAL | Status: DC | PRN

## 2018-02-21 MED ORDER — ZOLPIDEM TARTRATE 5 MG PO TABS: 5.0000 mg | ORAL_TABLET | Freq: Every evening | ORAL | Status: DC | PRN

## 2018-02-21 MED ORDER — OXYTOCIN 40 UNITS IN LACTATED RINGERS INFUSION - SIMPLE MED
1.0000 m[IU]/min | INTRAVENOUS | Status: DC
  Administered 2018-02-21: 2 m[IU]/min via INTRAVENOUS
  Filled 2018-02-21 (×2): qty 1000

## 2018-02-21 MED ORDER — BENZOCAINE-MENTHOL 20-0.5 % EX AERO
1.0000 "application " | INHALATION_SPRAY | CUTANEOUS | Status: DC | PRN
  Administered 2018-02-21 – 2018-02-23 (×2): 1 via TOPICAL
  Filled 2018-02-21 (×2): qty 56

## 2018-02-21 MED ORDER — ACETAMINOPHEN 325 MG PO TABS
650.0000 mg | ORAL_TABLET | ORAL | Status: DC | PRN
  Administered 2018-02-22: 650 mg via ORAL
  Filled 2018-02-21: qty 2

## 2018-02-21 MED ORDER — SENNOSIDES-DOCUSATE SODIUM 8.6-50 MG PO TABS
2.0000 | ORAL_TABLET | ORAL | Status: DC
  Administered 2018-02-21 – 2018-02-22 (×2): 2 via ORAL
  Filled 2018-02-21 (×2): qty 2

## 2018-02-21 MED ORDER — WITCH HAZEL-GLYCERIN EX PADS
1.0000 "application " | MEDICATED_PAD | CUTANEOUS | Status: DC | PRN
  Administered 2018-02-23: 1 via TOPICAL

## 2018-02-21 MED ORDER — OXYTOCIN 40 UNITS IN LACTATED RINGERS INFUSION - SIMPLE MED: 2.5000 [IU]/h | INTRAVENOUS | Status: DC

## 2018-02-21 MED ORDER — SIMETHICONE 80 MG PO CHEW: 80.0000 mg | CHEWABLE_TABLET | ORAL | Status: DC | PRN

## 2018-02-21 MED ORDER — PHENYLEPHRINE 40 MCG/ML (10ML) SYRINGE FOR IV PUSH (FOR BLOOD PRESSURE SUPPORT)
80.0000 ug | PREFILLED_SYRINGE | INTRAVENOUS | Status: DC | PRN
  Filled 2018-02-21: qty 5
  Filled 2018-02-21: qty 10

## 2018-02-21 MED ORDER — FENTANYL CITRATE (PF) 100 MCG/2ML IJ SOLN: 50.0000 ug | INTRAMUSCULAR | Status: DC | PRN

## 2018-02-21 MED ORDER — FENTANYL 2.5 MCG/ML BUPIVACAINE 1/10 % EPIDURAL INFUSION (WH - ANES)
14.0000 mL/h | INTRAMUSCULAR | Status: DC | PRN
  Administered 2018-02-21: 14 mL/h via EPIDURAL
  Filled 2018-02-21: qty 100

## 2018-02-21 MED ORDER — EPHEDRINE 5 MG/ML INJ
10.0000 mg | INTRAVENOUS | Status: DC | PRN
  Filled 2018-02-21: qty 2

## 2018-02-21 MED ORDER — PHENYLEPHRINE 40 MCG/ML (10ML) SYRINGE FOR IV PUSH (FOR BLOOD PRESSURE SUPPORT)
80.0000 ug | PREFILLED_SYRINGE | INTRAVENOUS | Status: DC | PRN
  Filled 2018-02-21: qty 5

## 2018-02-21 MED ORDER — GENTAMICIN SULFATE 40 MG/ML IJ SOLN
1.5000 mg/kg | Freq: Three times a day (TID) | INTRAVENOUS | Status: DC
  Administered 2018-02-21 – 2018-02-22 (×2): 110 mg via INTRAVENOUS
  Filled 2018-02-21 (×3): qty 2.75

## 2018-02-21 MED ORDER — LACTATED RINGERS IV SOLN: 500.0000 mL | Freq: Once | INTRAVENOUS | Status: DC

## 2018-02-21 NOTE — Anesthesia Pain Management Evaluation Note (Signed)
  CRNA Pain Management Visit Note  Patient: Alison Nichols, 32 y.o., female  "Hello I am a [facility] of the anesthesia team at [facility]. We have an anesthesia team available at all times to provide care throughout the hospital, including epidural management and anesthesia for C-section. I don't know your plan for the delivery whether it a natural birth, water birth, IV sedation, nitrous supplementation, doula or epidural, but we want to meet your pain goals."   1.Was your pain managed to your expectations on prior hospitalizations?   Yes   2.What is your expectation for pain management during this hospitalization?     Epidural and IV pain meds  3.How can we help you reach that goal? Be available  Record the patient's initial score and the patient's pain goal.   Pain: 1  Pain Goal: 5 [facility] wants you to be able to say your pain was always managed very well.  [name],Alison Nichols 02/21/2018

## 2018-02-21 NOTE — Progress Notes (Signed)
CVX 3/50/-2, AROM, Clear  Mitchel HonourMegan Adonis Yim, DO

## 2018-02-21 NOTE — Anesthesia Preprocedure Evaluation (Addendum)
Anesthesia Evaluation  Patient identified by MRN, date of birth, ID band Patient awake    Reviewed: Allergy & Precautions, NPO status , Patient's Chart, lab work & pertinent test results  Airway Mallampati: II  TM Distance: >3 FB Neck ROM: Full    Dental  (+) Dental Advisory Given   Pulmonary neg pulmonary ROS,    breath sounds clear to auscultation       Cardiovascular hypertension,  Rhythm:Regular Rate:Normal     Neuro/Psych  Headaches, Anxiety    GI/Hepatic Neg liver ROS, GERD  ,IBS   Endo/Other  negative endocrine ROS  Renal/GU negative Renal ROS  negative genitourinary   Musculoskeletal  Ehler's Danlos Syndrome (per patient, she has has classical, non-vascular subtype) Scoliosis - leftward deviation    Abdominal   Peds  Hematology negative hematology ROS (+)   Anesthesia Other Findings   Reproductive/Obstetrics (+) Pregnancy  Pre-eclampsia with previous pregnancy, PIH with this pregnancy                             Anesthesia Physical Anesthesia Plan  ASA: II  Anesthesia Plan: Epidural   Post-op Pain Management:    Induction:   PONV Risk Score and Plan:   Airway Management Planned: Natural Airway  Additional Equipment: None  Intra-op Plan:   Post-operative Plan:   Informed Consent: I have reviewed the patients History and Physical, chart, labs and discussed the procedure including the risks, benefits and alternatives for the proposed anesthesia with the patient or authorized representative who has indicated his/her understanding and acceptance.     Plan Discussed with: Anesthesiologist  Anesthesia Plan Comments: (Labs reviewed. Platelets acceptable, patient not taking any blood thinning medications. Risks and benefits discussed with patient, patient expressed understanding and wished to proceed.)        Anesthesia Quick Evaluation

## 2018-02-21 NOTE — Anesthesia Procedure Notes (Signed)
Epidural Patient location during procedure: OB Start time: 02/21/2018 8:51 AM End time: 02/21/2018 8:55 AM  Staffing Anesthesiologist: Beryle LatheBrock, Thomas E, MD Performed: anesthesiologist   Preanesthetic Checklist Completed: patient identified, pre-op evaluation, timeout performed, IV checked, risks and benefits discussed and monitors and equipment checked  Epidural Patient position: sitting Prep: DuraPrep Patient monitoring: continuous pulse ox and blood pressure Approach: midline Location: L2-L3 Injection technique: LOR saline  Needle:  Needle type: Tuohy  Needle gauge: 17 G Needle length: 9 cm Needle insertion depth: 4 cm Catheter size: 19 Gauge Catheter at skin depth: 10 cm Test dose: negative and Other (1% lidocaine)  Additional Notes Patient identified. Risks including, but not limited to, bleeding, infection, nerve damage, paralysis, inadequate analgesia, blood pressure changes, nausea, vomiting, allergic reaction, postpartum back pain, itching, and headache were discussed. Patient expressed understanding and wished to proceed. Sterile prep and drape, including hand hygiene, mask, and sterile gloves were used. The patient was positioned and the spine was prepped. Spine noted to have leftward deviation/scoliosis. The skin was anesthetized with lidocaine. No paraesthesia or other complication noted. The patient did not experience any signs of intravascular injection such as tinnitus or metallic taste in mouth, nor signs of intrathecal spread such as rapid motor block. Please see nursing notes for vital signs. The patient tolerated the procedure well.   Leslye Peerhomas Brock, MDReason for block:procedure for pain

## 2018-02-21 NOTE — H&P (Signed)
Alison Nichols is a 32 y.o. female presenting for IOL secondary to gestational hypertension.  The patient declines HA, CP/SOB, RUQ pain, or visual disturbance. Pre-eclampsia labs wnl. Antepartum course complicated by h/o pre-eclampsia for which she has taken daily baby aspirin.  She also has Carylon PerchesEhlers Danlos Syndrome and h/o PTD x 2; declined 17-P.  She has asthma which has been well controlled with Albuteral inhaler.  Also, she has anxiety well controlled on sertraline.  GBS negative.  OB History    Gravida  5   Para  2   Term  0   Preterm  2   AB  2   Living  2     SAB  2   TAB      Ectopic      Multiple      Live Births  2          Past Medical History:  Diagnosis Date  . Anxiety   . Ehlers-Danlos disease   . Headache   . IBS (irritable bowel syndrome)   . Vaginal Pap smear, abnormal    Past Surgical History:  Procedure Laterality Date  . CHOLECYSTECTOMY  2002  . COLPOSCOPY    . HYSTEROSCOPY W/D&C  2004  . KNEE ARTHROSCOPY  1999 & 2000  . TONSILLECTOMY     w/ Adenoidectomy   Family History: family history includes Heart failure in her maternal grandmother; Hypertension in her maternal grandmother; Thyroid disease in her maternal grandmother. Social History:  reports that she has never smoked. She has never used smokeless tobacco. She reports that she does not drink alcohol or use drugs.     Maternal Diabetes: No Genetic Screening: Normal Maternal Ultrasounds/Referrals: Normal Fetal Ultrasounds or other Referrals:  None Maternal Substance Abuse:  No Significant Maternal Medications:  Meds include: Other: sertraline, albuterol, baby ASA Significant Maternal Lab Results:  Lab values include: Group B Strep negative Other Comments:  None  ROS Maternal Medical History:  Fetal activity: Perceived fetal activity is normal.   Last perceived fetal movement was within the past hour.    Prenatal complications: PIH.   Prenatal Complications - Diabetes:  none.    Dilation: 2 Effacement (%): 50 Station: -2 Exam by:: Marcelino DusterMichelle, RN  Blood pressure 134/84, pulse 93, temperature 98.5 F (36.9 C), temperature source Oral, resp. rate 18, height 5\' 7"  (1.702 m), weight 156 lb (70.8 kg), last menstrual period 10/13/2016. Maternal Exam:  Uterine Assessment: Contraction strength is mild.  Contraction frequency is rare.   Abdomen: Patient reports no abdominal tenderness. Fundal height is c/w dates.   Estimated fetal weight is 6#8.       Physical Exam  Constitutional: She is oriented to person, place, and time. She appears well-developed and well-nourished.  GI: Soft. There is no rebound and no guarding.  Neurological: She is alert and oriented to person, place, and time.  Skin: Skin is warm and dry.  Psychiatric: She has a normal mood and affect. Her behavior is normal.    Prenatal labs: ABO, Rh: --/--/A POS (06/04 1545) Antibody: NEG (06/04 1545) Rubella: Nonimmune (12/20 0000) RPR: Nonreactive (12/20 0000)  HBsAg: Negative (12/20 0000)  HIV: Non-reactive (12/20 0000)  GBS:     Assessment/Plan: 16XW 31 G5P0222 at 316w0d with gestational hypertension for IOL -Patient requests epidural -Once comfortable, AROM -Pitocin prn -Anticipate NSVD   Kynadie Yaun 02/21/2018, 8:32 AM

## 2018-02-21 NOTE — Progress Notes (Signed)
Received call from RN that after transfer to mother/baby, maternal T 101.   Will give Tylenol and start Gent/Clinda.  Mitchel HonourMegan Dama Hedgepeth, DO

## 2018-02-22 LAB — CBC
HCT: 30.4 % — ABNORMAL LOW (ref 36.0–46.0)
Hemoglobin: 10.4 g/dL — ABNORMAL LOW (ref 12.0–15.0)
MCH: 31 pg (ref 26.0–34.0)
MCHC: 34.2 g/dL (ref 30.0–36.0)
MCV: 90.7 fL (ref 78.0–100.0)
Platelets: 132 10*3/uL — ABNORMAL LOW (ref 150–400)
RBC: 3.35 MIL/uL — ABNORMAL LOW (ref 3.87–5.11)
RDW: 13.6 % (ref 11.5–15.5)
WBC: 7.8 10*3/uL (ref 4.0–10.5)

## 2018-02-22 NOTE — Progress Notes (Signed)
Post Partum Day 1 Subjective: no complaints, up ad lib and voiding  Objective: Blood pressure 116/65, pulse 71, temperature 97.7 F (36.5 C), temperature source Oral, resp. rate 20, height 5\' 7"  (1.702 m), weight 156 lb (70.8 kg), last menstrual period 10/13/2016, SpO2 97 %, unknown if currently breastfeeding.  Physical Exam:  General: alert, cooperative, appears stated age and no distress Lochia: appropriate Uterine Fundus: firm Incision: healing well DVT Evaluation: No evidence of DVT seen on physical exam.  Recent Labs    02/21/18 1805 02/22/18 0538  HGB 11.3* 10.4*  HCT 33.0* 30.4*    Assessment/Plan: Plan for discharge tomorrow and Breastfeeding  PP endometritis - afebrile and nl wbc.  Will d/c abx   LOS: 1 day   Alison Nichols C 02/22/2018, 9:32 AM

## 2018-02-22 NOTE — Lactation Note (Signed)
This note was copied from a baby's chart. Lactation Consultation Note  Patient Name: Alison Nichols ZOXWR'UToday's Date: 02/22/2018    P3 mother whose infant is now 829 hours old.  This baby is an ETI at 37 weeks and weighs 5+10 oz.  I wanted to follow up to be sure mother was feeding well and baby was latching on without difficulty.  Mother stated that she had some difficulty early today with getting baby to open wide but now feels very comfortable with the wide mouth and deep latch.  She just finished feeding as I entered and her nipples are rounded but slightly pink and irritated.  Comfort gels provided with instructions for use.  Mother will call as needed for assistance but has no further questions/concerns at this time.                  Kareen Hitsman R Kalif Kattner 02/22/2018, 9:43 PM

## 2018-02-22 NOTE — Lactation Note (Signed)
This note was copied from a baby's chart. Lactation Consultation Note  Patient Name: Girl Blair PromiseMeredith Tirado ZOXWR'UToday's Date: 02/22/2018 Reason for consult: Initial assessment;Early term 37-38.6wks;Infant < 6lbs  P3 mother whose infant is now 208 hours old.  Mother has 2 other children who she breastfed but her youngest is now 34 years old. This baby is an ETI at 37 weeks who weighs 5+14.9 oz.  Mother stated that all her babies were a little bit early.  Mother was just beginning to latch baby as I arrived.  I assisted her to latch in the football hold on the left breast without difficulty.  Baby had strong suck and mother had no pain after doing a gentle chin tug.  Mother noticed that her left nipple was creased after one of the feeds.  Discussed with her the possibility of baby having a shallow latch and demonstrated how to obtain and maintain a deep latch.  Discussed the importance of keeping her awake at breast for her feedings and how to gently stimulate her to continue feeding rather than sleeping at the breast.  Mother seems quite confident in her ability to breastfeed.  Mom made aware of O/P services, breastfeeding support groups, community resources, and our phone # for post-discharge questions. Mother will call for assistance as needed.  Father present.   Maternal Data Formula Feeding for Exclusion: No Has patient been taught Hand Expression?: Yes Does the patient have breastfeeding experience prior to this delivery?: Yes  Feeding Feeding Type: Breast Fed Length of feed: 10 min(still feeding when I left the room)  LATCH Score Latch: Grasps breast easily, tongue down, lips flanged, rhythmical sucking.  Audible Swallowing: A few with stimulation  Type of Nipple: Everted at rest and after stimulation  Comfort (Breast/Nipple): Soft / non-tender  Hold (Positioning): Assistance needed to correctly position infant at breast and maintain latch.  LATCH Score:  8  Interventions Interventions: Breast feeding basics reviewed;Skin to skin;Breast massage;Hand express;Assisted with latch;Position options;Support pillows;Adjust position;Breast compression  Lactation Tools Discussed/Used WIC Program: No   Consult Status Consult Status: Follow-up Date: 02/23/18 Follow-up type: In-patient    Shalev Helminiak R Trayce Caravello 02/22/2018, 12:31 AM

## 2018-02-22 NOTE — Lactation Note (Signed)
This note was copied from a baby's chart. Lactation Consultation Note  Patient Name: Alison Blair PromiseMeredith Conkel ZOXWR'UToday's Date: 02/22/2018 Reason for consult: Follow-up assessment;Early term 37-38.6wks;Infant < 6lbs Mom reports feedings are going well.  She is working on Management consultantobtaining deeper latch.  Nipples are a little sore.  Discussed techniques to obtain deep latch.  Instructed to feed with cues and call for assist prn.  Maternal Data    Feeding    LATCH Score                   Interventions    Lactation Tools Discussed/Used     Consult Status Consult Status: Follow-up Date: 02/23/18 Follow-up type: In-patient    Huston FoleyMOULDEN, Opaline Reyburn S 02/22/2018, 12:20 PM

## 2018-02-22 NOTE — Anesthesia Postprocedure Evaluation (Signed)
Anesthesia Post Note  Patient: Alison Nichols  Procedure(s) Performed: AN AD HOC LABOR EPIDURAL     Patient location during evaluation: Mother Baby Anesthesia Type: Epidural Level of consciousness: awake Pain management: pain level controlled Vital Signs Assessment: post-procedure vital signs reviewed and stable Respiratory status: spontaneous breathing Cardiovascular status: stable Postop Assessment: no headache, no backache, epidural receding, patient able to bend at knees, no apparent nausea or vomiting, adequate PO intake and able to ambulate Anesthetic complications: no    Last Vitals:  Vitals:   02/21/18 1907 02/22/18 0540  BP:  116/65  Pulse:  71  Resp:  20  Temp:  36.5 C  SpO2: 97%     Last Pain:  Vitals:   02/22/18 0540  TempSrc: Oral  PainSc: 4    Pain Goal:                 [name]

## 2018-02-23 NOTE — Discharge Summary (Signed)
Obstetric Discharge Summary Reason for Admission: induction of labor Prenatal Procedures: Preeclampsia Intrapartum Procedures: spontaneous vaginal delivery Postpartum Procedures: none Complications-Operative and Postpartum: first degree perineal laceration Hemoglobin  Date Value Ref Range Status  02/22/2018 10.4 (L) 12.0 - 15.0 g/dL Final   HCT  Date Value Ref Range Status  02/22/2018 30.4 (L) 36.0 - 46.0 % Final    Physical Exam:  General: alert, cooperative and appears stated age 34Lochia: appropriate Uterine Fundus: firm Incision: healing well, no significant drainage, no dehiscence, no significant erythema DVT Evaluation: No evidence of DVT seen on physical exam.  Discharge Diagnoses: Preelampsia  Discharge Information: Date: 02/23/2018 Activity: pelvic rest Diet: routine Medications: None Condition: improved Instructions: refer to practice specific booklet Discharge to: home   Newborn Data: Live born female  Birth Weight: 5 lb 14.9 oz (2690 g) APGAR: 9, 9  Newborn Delivery   Birth date/time:  02/21/2018 15:48:00 Delivery type:  Vaginal, Spontaneous     Home with mother.  Nikolette Reindl L 02/23/2018, 8:38 AM

## 2018-02-23 NOTE — Lactation Note (Signed)
This note was copied from a baby's chart. Lactation Consultation Note  Patient Name: Alison Nichols ZOXWR'UToday's Date: 02/23/2018 Reason for consult: Follow-up assessment;Early term 37-38.6wks;Infant < 6lbs;Nipple pain/trauma;Infant weight loss  Visited with P3 Mom of ET infant weighing 5 lbs 7 oz, baby at 8% weight loss.  Mom holding baby, and baby had just fed for 20 mins.  Baby fussing and cueing.  Recommended Mom feed baby again.  Watched as Mom held baby up to breast without using any support.  Placed her boppy and a flat pillow to help baby reach breast level.  Baby letting baby latch shallow onto nipple.  Both nipples slightly pink, with right nipple tip abraded some.  Assisted Mom to use U hold, and facilitate waiting for baby to open wider while sandwiching breast.  Baby able to attain a deeper latch, and visible swallows identified.  Baby acting a little sleepy.  Baby at 8% weightloss, slight jaundice, and early term <6 lbs.  Recommended to Mom that she double pump after baby breastfeeds.  Mom encouraged to do breast massage and hand expression also to feed any EBM back to baby.  Mom has a Medela PIS and Spectra DEBP at home.  DEBP set up at bedside and assisted Mom with her first pumping.  About 5 ml of colostrum expressed, and Mom to spoon feed this to baby.   Mom [facility] [employer]ra [facility]BP [facility] to meet with IBCLC at Pediatrician office on Monday 02/27/18/    Interventions Interventions: Breast feeding basics reviewed;Assisted with latch;Skin to skin;Breast massage;Hand express;Breast compression;Adjust position;Support pillows;Position options;Expressed milk;Comfort gels;DEBP  Lactation Tools Discussed/Used Tools: Comfort gels;Pump Breast pump type: Double-Electric Breast Pump WIC Program: No Pump Review: Setup, frequency, and cleaning;Milk Storage Initiated by:: Erby Pian Yazmine Sorey RN IBCLC Date initiated:: 02/23/18   Consult Status Consult Status: Follow-up Date:  02/23/18 Follow-up type: In-patient    Alison Nichols, Alison Nichols 02/23/2018, 9:49 AM

## 2018-03-13 ENCOUNTER — Ambulatory Visit: Payer: 59 | Admitting: Family Medicine

## 2018-03-13 ENCOUNTER — Encounter: Payer: Self-pay | Admitting: Family Medicine

## 2018-03-13 ENCOUNTER — Other Ambulatory Visit: Payer: Self-pay

## 2018-03-13 VITALS — BP 116/90 | HR 78 | Temp 97.8°F | Ht 67.0 in | Wt 144.4 lb

## 2018-03-13 DIAGNOSIS — G43009 Migraine without aura, not intractable, without status migrainosus: Secondary | ICD-10-CM

## 2018-03-13 DIAGNOSIS — F419 Anxiety disorder, unspecified: Secondary | ICD-10-CM

## 2018-03-13 DIAGNOSIS — F900 Attention-deficit hyperactivity disorder, predominantly inattentive type: Secondary | ICD-10-CM

## 2018-03-13 DIAGNOSIS — Z0000 Encounter for general adult medical examination without abnormal findings: Secondary | ICD-10-CM | POA: Diagnosis not present

## 2018-03-13 MED ORDER — SUMATRIPTAN SUCCINATE 100 MG PO TABS
ORAL_TABLET | ORAL | 3 refills | Status: DC
Start: 2018-03-13 — End: 2018-05-29

## 2018-03-13 MED ORDER — SUMATRIPTAN SUCCINATE 100 MG PO TABS: ORAL_TABLET | ORAL | 3 refills | Status: DC

## 2018-03-13 MED ORDER — TRIAMCINOLONE ACETONIDE 0.1 % EX CREA: 1.0000 "application " | TOPICAL_CREAM | Freq: Two times a day (BID) | CUTANEOUS | 1 refills | Status: DC

## 2018-03-13 NOTE — Progress Notes (Signed)
Patient: Alison Nichols MRN: [id number] DOB: October 24, 1985 PCP: Orland MustardWolfe, Allison, MD     Subjective:  Chief Complaint  Patient presents with  . Establish Care    HPI: The patient is a [age] y.o. female who presents today for annual exam. She denies any changes to past medical history. There have been recent hospitalizations. She is two weeks post partum with a complicated course. She had pre eclampsia and required hospitalization at the beginning of June, 4 weeks bedrest and then induced delivery at 37 weeks. 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  They are following a well balanced diet and exercise plan. Weight has been decreasing rapidly due to having a baby. She does have a itchy legs that she has had since [redacted] weeks pregnant. No visible rash and cholestasis of pregnancy was ruled out. Has tried everything over the counter, oatmeal baths and given hydroxyzine by OB.   There is no immunization history for the selected administration types on file for this patient.  Tdap: 11/26/2017 Pap smear: 07/2017. Normal. Hx of one abnormal in 2004 with normal colposcopy. All other paps have been normal.   Anxiety/post partum depression: on zoloft since she was 13 and throughout her pregnancy. This seems to work well for her. Her gyn just increased the dose to 100mg  last week. She is not sure if she can tell much of a difference. She is breast feeding. No si/hi.   ADHD: officially diagnosed by [facility]. Was on 20mg  of adderall XR/day. Has not been on over past year while pregnant and will not take while breast feeding. She would like to get back on this after she is done breast feeding. This dosage worked well for her.   Migraines: requesting refill of her imitrex.    Review of Systems  Constitutional: Positive for unexpected weight change. Negative for activity change, appetite change, chills, fatigue and fever.       Pt is 3 wks post partum  HENT:  Negative for dental problem, ear pain, hearing loss and trouble swallowing.   Eyes: Negative for visual disturbance.  Respiratory: Negative for cough, chest tightness and shortness of breath.   Cardiovascular: Negative for chest pain, palpitations and leg swelling.  Gastrointestinal: Negative for abdominal pain, blood in stool, diarrhea, nausea and vomiting.  Endocrine: Negative for cold intolerance, polydipsia, polyphagia and polyuria.  Genitourinary: Positive for vaginal bleeding (improving ). Negative for dysuria and hematuria.  Musculoskeletal: Negative for arthralgias, back pain and neck pain.  Skin: Positive for rash.  Neurological: Positive for headaches. Negative for dizziness.  Psychiatric/Behavioral: Positive for decreased concentration. Negative for dysphoric mood, sleep disturbance and suicidal ideas. The patient is nervous/anxious.     Allergies Patient is allergic to cephalexin.  Past Medical History Patient  has a past medical history of Anxiety, Asthma, Depression, Ehlers-Danlos disease, Headache, Hypertension affecting pregnancy (2019), IBS (irritable bowel syndrome), Migraines, Scoliosis, and Vaginal Pap smear, abnormal.  Surgical History Patient  has a past surgical history that includes Cholecystectomy (2002); Hysteroscopy w/D&C (2004); Knee arthroscopy (1999 & 2000); Tonsillectomy; Colposcopy; and Combined hysteroscopy diagnostic / D&C (2004).  Family History Pateint's family history includes Arthritis in her maternal grandmother; Depression in her mother; Heart attack in her maternal grandmother; Heart failure in her maternal grandmother; Hyperlipidemia in her mother; Hypertension in her maternal grandfather, maternal grandmother, and mother; Kidney disease in her maternal grandfather; Thyroid disease in her maternal grandmother.  Social History Patient  reports that she  has never smoked. She has never used smokeless tobacco. She reports that she does not drink alcohol  or use drugs.    Objective: Vitals:   03/13/18 0815  BP: 116/90  Pulse: 78  Temp: 97.8 F (36.6 C)  TempSrc: Oral  SpO2: 98%  Weight: 144 lb 6.4 oz (65.5 kg)  Height: 5\' 7"  (1.702 m)    Body mass index is 22.62 kg/m.  Physical Exam  Constitutional: She is oriented to person, place, and time. She appears well-developed and well-nourished.  HENT:  Right Ear: External ear normal.  Left Ear: External ear normal.  Mouth/Throat: Oropharynx is clear and moist. No oropharyngeal exudate.  TM pearly with light reflex bilaterally   Eyes: Pupils are equal, round, and reactive to light. Conjunctivae and EOM are normal.  Neck: Normal range of motion. Neck supple. No thyromegaly present.  Cardiovascular: Normal rate, regular rhythm, normal heart sounds and intact distal pulses.  No murmur heard. Pulmonary/Chest: Effort normal and breath sounds normal.  Abdominal: Soft. Bowel sounds are normal. She exhibits no distension. There is no tenderness.  Lymphadenopathy:    She has no cervical adenopathy.  Neurological: She is alert and oriented to person, place, and time. She displays normal reflexes. No cranial nerve deficit. Coordination normal.  Skin: Skin is warm and dry. No rash (no rash on legs, but excoriation from where she has scratched. ) noted.  Psychiatric: She has a normal mood and affect. Her behavior is normal.  Vitals reviewed.      Assessment/plan: 1. Annual physical exam Up to date on her HM. Needs fasting labs, but we are going to wait until she is about 6+ months post partum. Will let me know when she wants to come in so I can order labs. Doing good for newborn. Encouraged regular exercise when cleared by OB, rest when she can and extra hydration with breast feeding.  Patient counseling [x]    Nutrition: Stressed importance of moderation in sodium/caffeine intake, saturated fat and cholesterol, caloric balance, sufficient intake of fresh fruits, vegetables, fiber, calcium,  iron, and 1 mg of folate supplement per day (for females capable of pregnancy).  [x]    Stressed the importance of regular exercise.   [x]    Substance Abuse: Discussed cessation/primary prevention of tobacco, alcohol, or other drug use; driving or other dangerous activities under the influence; availability of treatment for abuse.   [x]    Injury prevention: Discussed safety belts, safety helmets, smoke detector, smoking near bedding or upholstery.   [x]    Sexuality: Discussed sexually transmitted diseases, partner selection, use of condoms, avoidance of unintended pregnancy  and contraceptive alternatives.  [x]    Dental health: Discussed importance of regular tooth brushing, flossing, and dental visits.  [x]    Health maintenance and immunizations reviewed. Please refer to Health maintenance section.     2. Attention deficit hyperactivity disorder (ADHD), predominantly inattentive type She will get her records where she was diagnosed. Discussed I will need this to continue to prescribe medication. She does not need this right now as she is breast feeding, but wanted to be able to go on it once she is done breast feeding. Drug contract signed. I will get records and she will get me records. F/u every 3 months once she is on this regularly. Will start her back at 20mg  XR once she is done breast feeding since worked well for her. Any issues, let me know.   3. Anxiety/post partum depression  On zoloft 100mg . Just increased by OB.  Followed by ob, but I can take over once her post partum period is over. precautions given for any worsening depression, si/hi. She seems to be coping well.   4. Migraine without aura and without status migrainosus, not intractable Well controlled on imitrex. Will refill for her.   5. Pruritis on legs: no cholestasis of pregnancy, no PUPP, ? AEP. Continue hydroxyzine if doesn't drop milk and adding on steroid cream for legs. Hopefully will resolve in next couple of weeks. Let  me know if doesn't get better or changes.    Return in about 3 months (around 06/13/2018) for 3 monts after starting adderall .     Orland Mustard, MD [facility]  03/13/2018

## 2018-03-22 MED FILL — SERTRALINE HCL 100 MG TAB: 100 | 30 days supply | Qty: 30 | Fill #0 | Status: TO

## 2018-03-31 ENCOUNTER — Encounter: Payer: Self-pay | Admitting: Family Medicine

## 2018-04-05 DIAGNOSIS — Z1389 Encounter for screening for other disorder: Secondary | ICD-10-CM | POA: Diagnosis not present

## 2018-04-07 ENCOUNTER — Encounter: Payer: Self-pay | Admitting: Family Medicine

## 2018-04-07 DIAGNOSIS — Z30430 Encounter for insertion of intrauterine contraceptive device: Secondary | ICD-10-CM | POA: Diagnosis not present

## 2018-04-07 DIAGNOSIS — F900 Attention-deficit hyperactivity disorder, predominantly inattentive type: Secondary | ICD-10-CM | POA: Insufficient documentation

## 2018-04-07 DIAGNOSIS — Z3202 Encounter for pregnancy test, result negative: Secondary | ICD-10-CM | POA: Diagnosis not present

## 2018-04-10 ENCOUNTER — Other Ambulatory Visit: Payer: Self-pay

## 2018-04-10 MED ORDER — AMPHETAMINE-DEXTROAMPHET ER 20 MG PO CP24: 20.0000 mg | ORAL_CAPSULE | ORAL | 0 refills | Status: DC

## 2018-04-10 MED FILL — ADDERALL XR 20 MG CAP SA: 20 | 30 days supply | Qty: 30 | Fill #0

## 2018-04-10 NOTE — Progress Notes (Signed)
adderall refilled

## 2018-04-13 ENCOUNTER — Encounter: Payer: Self-pay | Admitting: Family Medicine

## 2018-04-19 MED FILL — SUMATRIPTAN SUCC 100 MG TAB: 100 | 30 days supply | Qty: 9 | Fill #0

## 2018-05-02 ENCOUNTER — Ambulatory Visit: Payer: 59 | Admitting: Psychology

## 2018-05-03 MED FILL — SERTRALINE HCL 100 MG TAB: 100 | 30 days supply | Qty: 30 | Fill #0

## 2018-05-13 ENCOUNTER — Telehealth: Payer: 59 | Admitting: Nurse Practitioner

## 2018-05-13 DIAGNOSIS — R05 Cough: Secondary | ICD-10-CM

## 2018-05-13 DIAGNOSIS — R059 Cough, unspecified: Secondary | ICD-10-CM

## 2018-05-13 MED ORDER — AZITHROMYCIN 250 MG PO TABS: ORAL_TABLET | ORAL | 0 refills | Status: DC

## 2018-05-13 MED ORDER — PREDNISONE 10 MG (21) PO TBPK: ORAL_TABLET | ORAL | 0 refills | Status: DC

## 2018-05-13 NOTE — Progress Notes (Signed)
We are sorry that you are not feeling well.  Here is how we plan to help!  Based on your presentation I believe you most likely have A cough due to bacteria.  When patients have a fever and a productive cough with a change in color or increased sputum production, we are concerned about bacterial bronchitis.  If left untreated it can progress to pneumonia.  If your symptoms do not improve with your treatment plan it is important that you contact your provider.   I have prescribed Azithromyin 250 mg: two tablets now and then one tablet daily for 4 additonal days    In addition you may use A non-prescription cough medication called Mucinex DM: take 2 tablets every 12 hours.  Prednisone 10 mg daily for 6 days (see taper instructions below)  Directions for 6 day taper: Day 1: 2 tablets before breakfast, 1 after  both lunch & dinner and 2 at bedtime Day 2: 1 tab before breakfast, 1 after both lunch & dinner and 2 at bedtime Day 3: 1 tab at each meal & 1 at bedtime Day 4: 1 tab at breakfast, 1 at lunch, 1 at bedtime Day 5: 1 tab at breakfast & 1 tab at bedtime Day 6: 1 tab at breakfast   From your responses in the eVisit questionnaire you describe inflammation in the upper respiratory tract which is causing a significant cough.  This is commonly called Bronchitis and has four common causes:    Allergies  Viral Infections  Acid Reflux  Bacterial Infection Allergies, viruses and acid reflux are treated by controlling symptoms or eliminating the cause. An example might be a cough caused by taking certain blood pressure medications. You stop the cough by changing the medication. Another example might be a cough caused by acid reflux. Controlling the reflux helps control the cough.  USE OF BRONCHODILATOR ("RESCUE") INHALERS: There is a risk from using your bronchodilator too frequently.  The risk is that over-reliance on a medication which only relaxes the muscles surrounding the breathing tubes  can reduce the effectiveness of medications prescribed to reduce swelling and congestion of the tubes themselves.  Although you feel brief relief from the bronchodilator inhaler, your asthma may actually be worsening with the tubes becoming more swollen and filled with mucus.  This can delay other crucial treatments, such as oral steroid medications. If you need to use a bronchodilator inhaler daily, several times per day, you should discuss this with your provider.  There are probably better treatments that could be used to keep your asthma under control.     HOME CARE . Only take medications as instructed by your medical team. . Complete the entire course of an antibiotic. . Drink plenty of fluids and get plenty of rest. . Avoid close contacts especially the very young and the elderly . Cover your mouth if you cough or cough into your sleeve. . Always remember to wash your hands . A steam or ultrasonic humidifier can help congestion.   GET HELP RIGHT AWAY IF: . You develop worsening fever. . You become short of breath . You cough up blood. . Your symptoms persist after you have completed your treatment plan MAKE SURE YOU   Understand these instructions.  Will watch your condition.  Will get help right away if you are not doing well or get worse.  Your e-visit answers were reviewed by a board certified advanced clinical practitioner to complete your personal care plan.  Depending on the   condition, your plan could have included both over the counter or prescription medications. If there is a problem please reply  once you have received a response from your provider. Your safety is important to us.  If you have drug allergies check your prescription carefully.    You can use MyChart to ask questions about today's visit, request a non-urgent call back, or ask for a work or school excuse for 24 hours related to this e-Visit. If it has been greater than 24 hours you will need to follow up  with your provider, or enter a new e-Visit to address those concerns. You will get an e-mail in the next two days asking about your experience.  I hope that your e-visit has been valuable and will speed your recovery. Thank you for using e-visits.   

## 2018-05-15 ENCOUNTER — Encounter: Payer: Self-pay | Admitting: Family Medicine

## 2018-05-15 ENCOUNTER — Other Ambulatory Visit: Payer: Self-pay

## 2018-05-15 MED ORDER — AMPHETAMINE-DEXTROAMPHET ER 20 MG PO CP24: 20.0000 mg | ORAL_CAPSULE | ORAL | 0 refills | Status: DC

## 2018-05-16 ENCOUNTER — Other Ambulatory Visit: Payer: Self-pay

## 2018-05-16 ENCOUNTER — Ambulatory Visit: Payer: 59 | Admitting: Psychology

## 2018-05-16 MED ORDER — AMPHETAMINE-DEXTROAMPHET ER 20 MG PO CP24: 20.0000 mg | ORAL_CAPSULE | ORAL | 0 refills | Status: DC

## 2018-05-19 ENCOUNTER — Other Ambulatory Visit: Payer: Self-pay

## 2018-05-19 MED ORDER — AMPHETAMINE-DEXTROAMPHET ER 20 MG PO CP24: 20.0000 mg | ORAL_CAPSULE | ORAL | 0 refills | Status: DC

## 2018-05-19 NOTE — Progress Notes (Signed)
pmp website checked. Filled her adderall.

## 2018-05-26 ENCOUNTER — Telehealth: Payer: 59 | Admitting: Family

## 2018-05-26 DIAGNOSIS — J028 Acute pharyngitis due to other specified organisms: Secondary | ICD-10-CM | POA: Diagnosis not present

## 2018-05-26 DIAGNOSIS — B9689 Other specified bacterial agents as the cause of diseases classified elsewhere: Secondary | ICD-10-CM | POA: Diagnosis not present

## 2018-05-26 MED ORDER — ALBUTEROL SULFATE 108 (90 BASE) MCG/ACT IN AEPB: 2.0000 | INHALATION_SPRAY | RESPIRATORY_TRACT | 5 refills | Status: DC | PRN

## 2018-05-26 MED ORDER — BENZONATATE 100 MG PO CAPS: 100.0000 mg | ORAL_CAPSULE | Freq: Three times a day (TID) | ORAL | 0 refills | Status: DC | PRN

## 2018-05-26 MED ORDER — DOXYCYCLINE HYCLATE 100 MG PO TABS: 100.0000 mg | ORAL_TABLET | Freq: Two times a day (BID) | ORAL | 0 refills | Status: DC

## 2018-05-26 MED FILL — BENZONATATE 100 MG CAPS: 100 | 5 days supply | Qty: 30 | Fill #0

## 2018-05-26 MED FILL — DOXYCYCLINE HYCLATE 100 MG: 100 | 7 days supply | Qty: 14 | Fill #0

## 2018-05-26 MED FILL — PROAIR RESPICLICK INHAL PWD: 108 (90 BAS | 17 days supply | Qty: 1 | Fill #0

## 2018-05-26 NOTE — Progress Notes (Signed)
Thank you for the details you included in the comment boxes. Those details are very helpful in determining the best course of treatment for you and help [place] to provide the best care. I am changing you to a new antibiotic. It has been too long at this point to consider it lingering symptoms. I also sent you a rescue inhaler.   We are sorry that you are not feeling well.  Here is how we plan to help!  Based on your presentation I believe you most likely have A cough due to bacteria.  When patients have a fever and a productive cough with a change in color or increased sputum production, we are concerned about bacterial bronchitis.  If left untreated it can progress to pneumonia.  If your symptoms do not improve with your treatment plan it is important that you contact your provider.   I have prescribed Doxycycline 100 mg twice a day for 7 days     In addition you may use A non-prescription cough medication called Mucinex DM: take 2 tablets every 12 hours. and A prescription cough medication called Tessalon Perles 100mg . You may take 1-2 capsules every 8 hours as needed for your cough.  Prednisone 5 mg daily for 6 days (see taper instructions below)  Directions for 6 day taper: Day 1: 2 tablets before breakfast, 1 after both lunch & dinner and 2 at bedtime Day 2: 1 tab before breakfast, 1 after both lunch & dinner and 2 at bedtime Day 3: 1 tab at each meal & 1 at bedtime Day 4: 1 tab at breakfast, 1 at lunch, 1 at bedtime Day 5: 1 tab at breakfast & 1 tab at bedtime Day 6: 1 tab at breakfast   From your responses in the eVisit questionnaire you describe inflammation in the upper respiratory tract which is causing a significant cough.  This is commonly called Bronchitis and has four common causes:    Allergies  Viral Infections  Acid Reflux  Bacterial Infection Allergies, viruses and acid reflux are treated by controlling symptoms or eliminating the cause. An example might be a cough caused by  taking certain blood pressure medications. You stop the cough by changing the medication. Another example might be a cough caused by acid reflux. Controlling the reflux helps control the cough.  USE OF BRONCHODILATOR ("RESCUE") INHALERS: There is a risk from using your bronchodilator too frequently.  The risk is that over-reliance on a medication which only relaxes the muscles surrounding the breathing tubes can reduce the effectiveness of medications prescribed to reduce swelling and congestion of the tubes themselves.  Although you feel brief relief from the bronchodilator inhaler, your asthma may actually be worsening with the tubes becoming more swollen and filled with mucus.  This can delay other crucial treatments, such as oral steroid medications. If you need to use a bronchodilator inhaler daily, several times per day, you should discuss this with your provider.  There are probably better treatments that could be used to keep your asthma under control.     HOME CARE . Only take medications as instructed by your medical team. . Complete the entire course of an antibiotic. . Drink plenty of fluids and get plenty of rest. . Avoid close contacts especially the very young and the elderly . Cover your mouth if you cough or cough into your sleeve. . Always remember to wash your hands . A steam or ultrasonic humidifier can help congestion.   GET HELP RIGHT AWAY  IF: . You develop worsening fever. . You become short of breath . You cough up blood. . Your symptoms persist after you have completed your treatment plan MAKE SURE YOU   Understand these instructions.  Will watch your condition.  Will get help right away if you are not doing well or get worse.  Your e-visit answers were reviewed by a board certified advanced clinical practitioner to complete your personal care plan.  Depending on the condition, your plan could have included both over the counter or prescription medications. If  there is a problem please reply  once you have received a response from your provider. Your safety is important to [place].  If you have drug allergies check your prescription carefully.    You can use MyChart to ask questions about today's visit, request a non-urgent call back, or ask for a work or school excuse for 24 hours related to this e-Visit. If it has been greater than 24 hours you will need to follow up with your provider, or enter a new e-Visit to address those concerns. You will get an e-mail in the next two days asking about your experience.  I hope that your e-visit has been valuable and will speed your recovery. Thank you for using e-visits.

## 2018-05-29 ENCOUNTER — Ambulatory Visit: Payer: 59 | Admitting: Family Medicine

## 2018-05-29 ENCOUNTER — Encounter: Payer: Self-pay | Admitting: Family Medicine

## 2018-05-29 VITALS — BP 110/64 | HR 85 | Temp 97.8°F | Ht 67.0 in | Wt 146.6 lb

## 2018-05-29 DIAGNOSIS — G44209 Tension-type headache, unspecified, not intractable: Secondary | ICD-10-CM | POA: Diagnosis not present

## 2018-05-29 DIAGNOSIS — G43109 Migraine with aura, not intractable, without status migrainosus: Secondary | ICD-10-CM | POA: Diagnosis not present

## 2018-05-29 MED ORDER — BACLOFEN 10 MG PO TABS: 10.0000 mg | ORAL_TABLET | Freq: Three times a day (TID) | ORAL | 0 refills | Status: DC

## 2018-05-29 MED ORDER — SUMATRIPTAN SUCCINATE 100 MG PO TABS: ORAL_TABLET | ORAL | 3 refills | Status: DC

## 2018-05-29 MED FILL — SUMAtriptan SUCCINATE 100 M: 100 | 30 days supply | Qty: 9 | Fill #0

## 2018-05-29 MED FILL — BACLOFEN 10 MG TABLET: 10 | 10 days supply | Qty: 30 | Fill #0

## 2018-05-29 NOTE — Progress Notes (Signed)
Patient: Alison Nichols MRN: [id number] DOB: 04/03/1986 PCP: Orland Mustard, MD     Subjective:  Chief Complaint  Patient presents with  . migraines    HPI: The patient is a 32 y.o. female who presents today for migraines. Has history of migraines. Just got copper IUD x 2 months ago. She is currently on imitrex prn. She is getting more migraines recently. She is getting about 1-2 migraines a week and was only getting 1-2/month. She thinks her migraines are worse with ovulation/periods. Her baby is now 13 weeks and so she is getting more sleep. She has done a journal with her period log. Sometimes she is getting regular tension headaches and is grinding her teeth during the day and having neck pain. She made this appointment because she thought she was going to miss work due to the headaches, but has not done. She is not interested in doing preventative medication at this time and doesn't really qualify. She is having a combo of both and is only 13 weeks post partum. No focal deficits. Normal headaches. No more breast feeding. When discussed we wonder if she is having more tension headaches than migraines. She has also been on steroids twice recently which also may be contributing.   Review of Systems  Constitutional: Negative for fatigue.  Respiratory: Positive for cough and shortness of breath.   Cardiovascular: Negative for chest pain.  Gastrointestinal: Negative for abdominal pain and nausea.  Musculoskeletal: Positive for neck pain. Negative for neck stiffness.  Neurological: Positive for headaches. Negative for dizziness and light-headedness.  Psychiatric/Behavioral: Positive for sleep disturbance. The patient is not nervous/anxious.     Allergies Patient is allergic to cephalexin.  Past Medical History Patient  has a past medical history of Anxiety, Asthma, ASTHMA (01/27/2008), Depression, Ehlers-Danlos disease, Headache, Hypertension affecting pregnancy (2019), IBS (irritable  bowel syndrome), Migraines, Scoliosis, and Vaginal Pap smear, abnormal.  Surgical History Patient  has a past surgical history that includes Cholecystectomy (2002); Hysteroscopy w/D&C (2004); Knee arthroscopy (1999 & 2000); Tonsillectomy; Colposcopy; and Combined hysteroscopy diagnostic / D&C (2004).  Family History Pateint's family history includes Arthritis in her maternal grandmother; Depression in her mother; Heart attack in her maternal grandmother; Heart failure in her maternal grandmother; Hyperlipidemia in her mother; Hypertension in her maternal grandfather, maternal grandmother, and mother; Kidney disease in her maternal grandfather; Thyroid disease in her maternal grandmother.  Social History Patient  reports that she has never smoked. She has never used smokeless tobacco. She reports that she does not drink alcohol or use drugs.    Objective: Vitals:   05/29/18 0810  BP: 110/64  Pulse: 85  Temp: 97.8 F (36.6 C)  TempSrc: Oral  SpO2: 98%  Weight: 146 lb 9.6 oz (66.5 kg)  Height: 5\' 7"  (1.702 m)    Body mass index is 22.96 kg/m.  Physical Exam  Constitutional: She is oriented to person, place, and time. She appears well-developed and well-nourished.  Eyes: Pupils are equal, round, and reactive to light. Conjunctivae and EOM are normal.  Neck: Normal range of motion. Neck supple.  Cardiovascular: Normal rate, regular rhythm and normal heart sounds.  Pulmonary/Chest: Effort normal and breath sounds normal.  Abdominal: Soft. Bowel sounds are normal.  Neurological: She is alert and oriented to person, place, and time. She displays normal reflexes. No cranial nerve deficit. She exhibits normal muscle tone.  Vitals reviewed.      Assessment/plan: 1. Tension headache NSAIDs prn, work on decreasing stress (hard with newborn and  working full time), exercise, and will try muscle relaxer prn. Did discuss that baclofen can give headaches, so she may find that this is not  helpful.   2. Migraine with aura and without status migrainosus, not intractable multifactorial issues that are increasing her migraines. She is only 13 weeks post partum, irregular periods, lack of sleep, not exercising, stress of working/newborn. She knows periods can trigger her migraines and she has been bleeding x 2 weeks. We are not going to start preventative medication at this time and just give the post partum hormone issue time to resolve, work on sleep and work on exercise. I think this will sort itself out, but she will let me know if not getting better. Can discuss if she would be interested in immunomodulator injection vs. Oral preventative therapy at that time.     Return if symptoms worsen or fail to improve.     Orland Mustard, MD [facility]   05/29/2018

## 2018-05-30 ENCOUNTER — Ambulatory Visit: Payer: 59 | Admitting: Psychology

## 2018-06-22 ENCOUNTER — Other Ambulatory Visit: Payer: Self-pay

## 2018-06-22 ENCOUNTER — Encounter: Payer: Self-pay | Admitting: Family Medicine

## 2018-06-23 MED ORDER — AMPHETAMINE-DEXTROAMPHET ER 20 MG PO CP24: 20.0000 mg | ORAL_CAPSULE | ORAL | 0 refills | Status: DC

## 2018-06-23 MED FILL — ADDERALL XR 20 MG CAP SA: 20 | 30 days supply | Qty: 30 | Fill #0

## 2018-06-26 MED FILL — SERTRALINE HCL 100 MG TAB: 100 | 30 days supply | Qty: 30 | Fill #1

## 2018-07-06 ENCOUNTER — Encounter: Payer: Self-pay | Admitting: Family Medicine

## 2018-07-06 ENCOUNTER — Other Ambulatory Visit: Payer: Self-pay

## 2018-07-06 MED ORDER — BACLOFEN 10 MG PO TABS: 10.0000 mg | ORAL_TABLET | Freq: Three times a day (TID) | ORAL | 0 refills | Status: DC

## 2018-07-06 MED FILL — BACLOFEN 10 MG TABLET: 10 | 30 days supply | Qty: 90 | Fill #0

## 2018-07-26 ENCOUNTER — Encounter: Payer: Self-pay | Admitting: Family Medicine

## 2018-07-28 ENCOUNTER — Other Ambulatory Visit: Payer: Self-pay | Admitting: Family Medicine

## 2018-07-28 MED ORDER — TOPIRAMATE 25 MG PO TABS: ORAL_TABLET | ORAL | 0 refills | Status: DC

## 2018-07-28 MED FILL — TOPIRAMATE 25 MG TAB: 25 | 30 days supply | Qty: 45 | Fill #0

## 2018-07-29 ENCOUNTER — Telehealth: Payer: 59 | Admitting: Family

## 2018-07-29 DIAGNOSIS — J028 Acute pharyngitis due to other specified organisms: Secondary | ICD-10-CM

## 2018-07-29 DIAGNOSIS — B9689 Other specified bacterial agents as the cause of diseases classified elsewhere: Secondary | ICD-10-CM | POA: Diagnosis not present

## 2018-07-29 DIAGNOSIS — J00 Acute nasopharyngitis [common cold]: Secondary | ICD-10-CM | POA: Diagnosis not present

## 2018-07-29 MED ORDER — BENZONATATE 100 MG PO CAPS: 100.0000 mg | ORAL_CAPSULE | Freq: Three times a day (TID) | ORAL | 0 refills | Status: DC | PRN

## 2018-07-29 MED ORDER — FLUTICASONE PROPIONATE 50 MCG/ACT NA SUSP: 1.0000 | Freq: Two times a day (BID) | NASAL | 3 refills | Status: DC

## 2018-07-29 NOTE — Progress Notes (Signed)
Thank you for the details you included in the comment boxes. Those details are very helpful in determining the best course of treatment for you and help us to provide the best care. As far as chills, anything that causes a fever can also cause chills. Any infection can also cause body aches. The main thing that pushes this into flu is a higher fever (more like 101/102 range). See plan below.   We are sorry you are not feeling well.  Here is how we plan to help!  Based on what you have shared with me, it looks like you may have a viral upper respiratory infection or a "common cold".  Colds are caused by a large number of viruses; however, rhinovirus is the most common cause.   Symptoms of the common cold vary from person to person, with common symptoms including sore throat, cough, and malaise.  A low-grade fever of 100.4 may present, but is often uncommon.  Symptoms vary however, and are closely related to a person's age or underlying illnesses.  The most common symptoms associated with the common cold are nasal discharge or congestion, cough, sneezing, headache and pressure in the ears and face.  Cold symptoms usually persist for about 3 to 10 days, but can last up to 2 weeks.  It is important to know that colds do not cause serious illness or complications in most cases.    The common cold is transmitted from person to person, with the most common method of transmission being a person's hands.  The virus is able to live on the skin and can infect other persons for up to 2 hours after direct contact.  Also, colds are transmitted when someone coughs or sneezes; thus, it is important to cover the mouth to reduce this risk.  To keep the spread of the common cold at bay, good hand hygiene is very important.  This is an infection that is most likely caused by a virus. There are no specific treatments for the common cold other than to help you with the symptoms until the infection runs its course.    For  nasal congestion, you may use an oral decongestants such as Mucinex D or if you have glaucoma or high blood pressure use plain Mucinex.  Saline nasal spray or nasal drops can help and can safely be used as often as needed for congestion.  For your congestion, I have prescribed Fluticasone nasal spray one spray in each nostril twice a day  If you do not have a history of heart disease, hypertension, diabetes or thyroid disease, prostate/bladder issues or glaucoma, you may also use Sudafed to treat nasal congestion.  It is highly recommended that you consult with a pharmacist or your primary care physician to ensure this medication is safe for you to take.     If you have a cough, you may use cough suppressants such as Delsym and Robitussin.  If you have glaucoma or high blood pressure, you can also use Coricidin HBP.   For cough I have prescribed for you A prescription cough medication called Tessalon Perles 100 mg. You may take 1-2 capsules every 8 hours as needed for cough  If you have a sore or scratchy throat, use a saltwater gargle-  to  teaspoon of salt dissolved in a 4-ounce to 8-ounce glass of warm water.  Gargle the solution for approximately 15-30 seconds and then spit.  It is important not to swallow the solution.  You can also  use throat lozenges/cough drops and Chloraseptic spray to help with throat pain or discomfort.  Warm or cold liquids can also be helpful in relieving throat pain.  For headache, pain or general discomfort, you can use Ibuprofen or Tylenol as directed.   Some authorities believe that zinc sprays or the use of Echinacea may shorten the course of your symptoms.   HOME CARE . Only take medications as instructed by your medical team. . Be sure to drink plenty of fluids. Water is fine as well as fruit juices, sodas and electrolyte beverages. You may want to stay away from caffeine or alcohol. If you are nauseated, try taking small sips of liquids. How do you know if you  are getting enough fluid? Your urine should be a pale yellow or almost colorless. . Get rest. . Taking a steamy shower or using a humidifier may help nasal congestion and ease sore throat pain. You can place a towel over your head and breathe in the steam from hot water coming from a faucet. . Using a saline nasal spray works much the same way. . Cough drops, hard candies and sore throat lozenges may ease your cough. . Avoid close contacts especially the very young and the elderly . Cover your mouth if you cough or sneeze . Always remember to wash your hands.   GET HELP RIGHT AWAY IF: . You develop worsening fever. . If your symptoms do not improve within 10 days . You develop yellow or green discharge from your nose over 3 days. . You have coughing fits . You develop a severe head ache or visual changes. . You develop shortness of breath, difficulty breathing or start having chest pain . Your symptoms persist after you have completed your treatment plan  MAKE SURE YOU   Understand these instructions.  Will watch your condition.  Will get help right away if you are not doing well or get worse.  Your e-visit answers were reviewed by a board certified advanced clinical practitioner to complete your personal care plan. Depending upon the condition, your plan could have included both over the counter or prescription medications. Please review your pharmacy choice. If there is a problem, you may call our nursing hot line at and have the prescription routed to another pharmacy. Your safety is important to [place]. If you have drug allergies check your prescription carefully.   You can use MyChart to ask questions about today's visit, request a non-urgent call back, or ask for a work or school excuse for 24 hours related to this e-Visit. If it has been greater than 24 hours you will need to follow up with your provider, or enter a new e-Visit to address those concerns. You will get an e-mail in  the next two days asking about your experience.  I hope that your e-visit has been valuable and will speed your recovery. Thank you for using e-visits.

## 2018-07-31 ENCOUNTER — Encounter: Payer: Self-pay | Admitting: Family Medicine

## 2018-07-31 MED FILL — BENZONATATE 100 MG CAPS: 100 | 5 days supply | Qty: 30 | Fill #0

## 2018-07-31 MED FILL — FLUTICASONE PROP 50 MCG SPR: 50 | 30 days supply | Qty: 16 | Fill #0

## 2018-08-02 ENCOUNTER — Other Ambulatory Visit: Payer: Self-pay

## 2018-08-02 ENCOUNTER — Encounter: Payer: Self-pay | Admitting: Family Medicine

## 2018-08-02 MED ORDER — AMPHETAMINE-DEXTROAMPHET ER 20 MG PO CP24: 20.0000 mg | ORAL_CAPSULE | ORAL | 0 refills | Status: DC

## 2018-08-02 MED FILL — ADDERALL XR 20 MG CAP SA: 20 | 30 days supply | Qty: 30 | Fill #0

## 2018-08-02 NOTE — Progress Notes (Signed)
Refilled ADHD meds. Way overdue for her appointment q 3 months. My chart message sent to come in for this.

## 2018-08-03 MED FILL — SUMAtriptan SUCCINATE 100 M: 100 | 30 days supply | Qty: 9 | Fill #1

## 2018-08-07 MED FILL — SERTRALINE HCL 100 MG TAB: 100 | 30 days supply | Qty: 30 | Fill #2

## 2018-08-11 ENCOUNTER — Ambulatory Visit: Payer: 59 | Admitting: Family Medicine

## 2018-08-23 ENCOUNTER — Telehealth: Payer: 59 | Admitting: Family

## 2018-08-23 DIAGNOSIS — J019 Acute sinusitis, unspecified: Secondary | ICD-10-CM

## 2018-08-23 MED ORDER — DOXYCYCLINE HYCLATE 100 MG PO TABS: 100.0000 mg | ORAL_TABLET | Freq: Two times a day (BID) | ORAL | 0 refills | Status: DC

## 2018-08-23 NOTE — Progress Notes (Signed)
We are sorry that you are not feeling well.  Here is how we plan to help!  Based on what you have shared with me it looks like you have sinusitis.  Sinusitis is inflammation and infection in the sinus cavities of the head.  Based on your presentation I believe you most likely have Acute Bacterial Sinusitis.  This is an infection caused by bacteria and is treated with antibiotics. I have prescribed Doxycycline 100mg  by mouth twice a day for 10 days. You may use an oral decongestant such as Mucinex D or if you have glaucoma or high blood pressure use plain Mucinex. Saline nasal spray help and can safely be used as often as needed for congestion.  If you develop worsening sinus pain, fever or notice severe headache and vision changes, or if symptoms are not better after completion of antibiotic, please schedule an appointment with a health care provider.    We do not use Zpaks to treat sinus infections.   Sinus infections are not as easily transmitted as other respiratory infection, however we still recommend that you avoid close contact with loved ones, especially the very young and elderly.  Remember to wash your hands thoroughly throughout the day as this is the number one way to prevent the spread of infection!  Home Care:  Only take medications as instructed by your medical team.  Complete the entire course of an antibiotic.  Do not take these medications with alcohol.  A steam or ultrasonic humidifier can help congestion.  You can place a towel over your head and breathe in the steam from hot water coming from a faucet.  Avoid close contacts especially the very young and the elderly.  Cover your mouth when you cough or sneeze.  Always remember to wash your hands.  Get Help Right Away If:  You develop worsening fever or sinus pain.  You develop a severe head ache or visual changes.  Your symptoms persist after you have completed your treatment plan.  Make sure you  Understand  these instructions.  Will watch your condition.  Will get help right away if you are not doing well or get worse.  Your e-visit answers were reviewed by a board certified advanced clinical practitioner to complete your personal care plan.  Depending on the condition, your plan could have included both over the counter or prescription medications.  If there is a problem please reply  once you have received a response from your provider.  Your safety is important to [place].  If you have drug allergies check your prescription carefully.    You can use MyChart to ask questions about today's visit, request a non-urgent call back, or ask for a work or school excuse for 24 hours related to this e-Visit. If it has been greater than 24 hours you will need to follow up with your provider, or enter a new e-Visit to address those concerns.  You will get an e-mail in the next two days asking about your experience.  I hope that your e-visit has been valuable and will speed your recovery. Thank you for using e-visits.

## 2018-08-24 MED FILL — DOXYCYCLINE HYCLATE 100 MG: 100 | 10 days supply | Qty: 20 | Fill #0

## 2018-08-31 ENCOUNTER — Encounter: Payer: Self-pay | Admitting: Family Medicine

## 2018-08-31 ENCOUNTER — Ambulatory Visit: Payer: 59 | Admitting: Family Medicine

## 2018-08-31 VITALS — BP 120/82 | HR 89 | Temp 98.0°F | Ht 67.0 in | Wt 147.0 lb

## 2018-08-31 DIAGNOSIS — G43109 Migraine with aura, not intractable, without status migrainosus: Secondary | ICD-10-CM | POA: Diagnosis not present

## 2018-08-31 DIAGNOSIS — F900 Attention-deficit hyperactivity disorder, predominantly inattentive type: Secondary | ICD-10-CM

## 2018-08-31 MED ORDER — AMPHETAMINE-DEXTROAMPHETAMINE 5 MG PO TABS: 5.0000 mg | ORAL_TABLET | Freq: Every day | ORAL | 0 refills | Status: DC

## 2018-08-31 MED ORDER — AMPHETAMINE-DEXTROAMPHET ER 20 MG PO CP24: 20.0000 mg | ORAL_CAPSULE | ORAL | 0 refills | Status: DC

## 2018-08-31 MED ORDER — AZITHROMYCIN 250 MG PO TABS: ORAL_TABLET | ORAL | 0 refills | Status: DC

## 2018-08-31 MED ORDER — HYDROXYZINE HCL 25 MG PO TABS: 25.0000 mg | ORAL_TABLET | Freq: Every evening | ORAL | 1 refills | Status: DC | PRN

## 2018-08-31 MED ORDER — SUMATRIPTAN SUCCINATE 100 MG PO TABS: ORAL_TABLET | ORAL | 3 refills | Status: DC

## 2018-08-31 MED FILL — DEXTROAMP-AMPHETAMINE 5 MG: 5 | 30 days supply | Qty: 30 | Fill #0

## 2018-08-31 MED FILL — hydrOXYzine HCL 25 MG TABS: 25 | 90 days supply | Qty: 90 | Fill #0

## 2018-08-31 MED FILL — AZITHROMYCIN 250 MG TABLET: 250 | 5 days supply | Qty: 6 | Fill #0

## 2018-08-31 MED FILL — ADDERALL XR 20 MG CAP SA: 20 | 30 days supply | Qty: 30 | Fill #0

## 2018-08-31 MED FILL — SUMAtriptan SUCCINATE 100 M: 100 | 30 days supply | Qty: 9 | Fill #0

## 2018-08-31 NOTE — Progress Notes (Signed)
Patient: Alison Nichols MRN: [id number] DOB: 1986/02/06 PCP: Orland Mustard, MD     Subjective:  Chief Complaint  Patient presents with  . ADHD follow up  . Migraine    HPI: The patient is a 33 y.o. female who presents today for adhd and migraine follow up.   Adhd: currently on adderall XR 20mg  daily. PMP website checked and verified. She is filling correctly and with no other providers. She takes as prescribed. No side effects and tolerating well. She thinks it doesn't last through the afternoon and is interested in starting an IR dose as needed in the afternoon.   Migraines:  We started her on topomax for preventive therapy. She doesn't love it and has taken it before and ended up having to take 200mg . She tried to increase to 50mg , but couldn't tolerate due to the tinling in her hands/feet. She does feel like it's helped some. Since she started it she has only taken 1 imitrex and a few baclofen. She did not start the magnesium/co-q. She did keep a log and she gets them the week she ovulates and when she gets her period. She has the copper IUD.   Review of Systems  Constitutional: Negative for chills, fatigue and fever.  HENT: Negative for dental problem, ear pain, hearing loss and trouble swallowing.   Eyes: Negative for visual disturbance.  Respiratory: Negative for cough, chest tightness and shortness of breath.   Cardiovascular: Negative for chest pain, palpitations and leg swelling.  Gastrointestinal: Negative for abdominal pain, blood in stool, diarrhea and nausea.  Endocrine: Negative for cold intolerance, polydipsia, polyphagia and polyuria.  Genitourinary: Negative for dysuria and hematuria.  Musculoskeletal: Negative for arthralgias.  Skin: Negative for rash.  Neurological: Positive for headaches. Negative for dizziness.  Psychiatric/Behavioral: Negative for dysphoric mood and sleep disturbance. The patient is not nervous/anxious.     Allergies Patient is allergic  to cephalexin.  Past Medical History Patient  has a past medical history of Anxiety, Asthma, ASTHMA (01/27/2008), Depression, Ehlers-Danlos disease, Headache, Hypertension affecting pregnancy (2019), IBS (irritable bowel syndrome), Migraines, Scoliosis, and Vaginal Pap smear, abnormal.  Surgical History Patient  has a past surgical history that includes Cholecystectomy (2002); Hysteroscopy w/D&C (2004); Knee arthroscopy (1999 & 2000); Tonsillectomy; Colposcopy; and Combined hysteroscopy diagnostic / D&C (2004).  Family History Pateint's family history includes Arthritis in her maternal grandmother; Depression in her mother; Heart attack in her maternal grandmother; Heart failure in her maternal grandmother; Hyperlipidemia in her mother; Hypertension in her maternal grandfather, maternal grandmother, and mother; Kidney disease in her maternal grandfather; Thyroid disease in her maternal grandmother.  Social History Patient  reports that she has never smoked. She has never used smokeless tobacco. She reports that she does not drink alcohol or use drugs.    Objective: Vitals:   08/31/18 1430  BP: 120/82  Pulse: 89  Temp: 98 F (36.7 C)  TempSrc: Oral  SpO2: 98%  Weight: 147 lb (66.7 kg)  Height: 5\' 7"  (1.702 m)    Body mass index is 23.02 kg/m.  Physical Exam Vitals signs reviewed.  Constitutional:      Appearance: She is well-developed.  Neck:     Musculoskeletal: Normal range of motion and neck supple.     Thyroid: No thyromegaly.  Cardiovascular:     Rate and Rhythm: Normal rate and regular rhythm.     Heart sounds: Normal heart sounds. No murmur.  Pulmonary:     Effort: Pulmonary effort is normal.  Breath sounds: Normal breath sounds.  Abdominal:     General: Bowel sounds are normal. There is no distension.     Palpations: Abdomen is soft.     Tenderness: There is no abdominal tenderness.  Lymphadenopathy:     Cervical: No cervical adenopathy.  Skin:    General:  Skin is warm and dry.     Findings: No rash.  Neurological:     General: No focal deficit present.     Mental Status: She is alert and oriented to person, place, and time.     Cranial Nerves: No cranial nerve deficit.     Coordination: Coordination normal.     Deep Tendon Reflexes: Reflexes normal.  Psychiatric:        Behavior: Behavior normal.        Assessment/plan: 1. ADHD, predominantly inattentive type PMP website verified. Filling correctly. Refilled her xr today and we are going to add on a 5mg  IR in the afternoon as needed. She will let me know if any issues. F/u in 3 months or as needed.   2. Migraine with aura and without status migrainosus, not intractable I think she would be a great candidate for aimovig/biological. Can't tolerate higher dose of topamax. Referral done. continue imitrex prn. Really want her to add on magnesium 500mg  and coq10.  - Ambulatory referral to Neurology    Return in about 3 months (around 11/30/2018) for adhd.    Orland Mustard, MD [facility]   08/31/2018

## 2018-09-05 ENCOUNTER — Encounter: Payer: Self-pay | Admitting: Neurology

## 2018-09-19 MED FILL — SERTRALINE HCL 100 MG TAB: 100 | 30 days supply | Qty: 30 | Fill #3

## 2018-09-26 ENCOUNTER — Encounter: Payer: Self-pay | Admitting: Family Medicine

## 2018-09-27 ENCOUNTER — Other Ambulatory Visit: Payer: Self-pay | Admitting: Family Medicine

## 2018-09-27 MED ORDER — TOPIRAMATE 25 MG PO TABS: ORAL_TABLET | ORAL | 1 refills | Status: DC

## 2018-09-27 MED FILL — TOPIRAMATE 25 MG TAB: 25 | 90 days supply | Qty: 90 | Fill #0

## 2018-10-13 ENCOUNTER — Encounter: Payer: Self-pay | Admitting: Family Medicine

## 2018-10-13 ENCOUNTER — Other Ambulatory Visit: Payer: Self-pay

## 2018-10-13 MED ORDER — AMPHETAMINE-DEXTROAMPHET ER 20 MG PO CP24: 20.0000 mg | ORAL_CAPSULE | ORAL | 0 refills | Status: DC

## 2018-10-13 MED FILL — ADDERALL XR 20 MG CAP SA: 20 | 30 days supply | Qty: 30 | Fill #0

## 2018-10-13 NOTE — Progress Notes (Signed)
pmp reviewed. adderall filled. Seen in in January. F/u in April.  Orland Mustard, MD [facility]

## 2018-10-14 ENCOUNTER — Other Ambulatory Visit: Payer: Self-pay | Admitting: Family Medicine

## 2018-10-16 MED ORDER — AMPHETAMINE-DEXTROAMPHETAMINE 5 MG PO TABS: 5.0000 mg | ORAL_TABLET | Freq: Every day | ORAL | 0 refills | Status: DC

## 2018-10-16 MED ORDER — BACLOFEN 10 MG PO TABS: 10.0000 mg | ORAL_TABLET | Freq: Three times a day (TID) | ORAL | 0 refills | Status: DC

## 2018-10-16 MED FILL — BACLOFEN 10 MG TABLET: 10 | 30 days supply | Qty: 90 | Fill #0

## 2018-10-16 MED FILL — DEXTROAMP-AMPHETAMINE 5 MG: 5 | 30 days supply | Qty: 30 | Fill #0

## 2018-10-19 ENCOUNTER — Encounter: Payer: Self-pay | Admitting: Family Medicine

## 2018-10-19 ENCOUNTER — Ambulatory Visit: Payer: 59 | Admitting: Family Medicine

## 2018-10-19 VITALS — BP 120/76 | HR 71 | Temp 98.5°F | Ht 67.0 in | Wt 149.0 lb

## 2018-10-19 DIAGNOSIS — R6889 Other general symptoms and signs: Secondary | ICD-10-CM

## 2018-10-19 DIAGNOSIS — J111 Influenza due to unidentified influenza virus with other respiratory manifestations: Secondary | ICD-10-CM | POA: Diagnosis not present

## 2018-10-19 LAB — POCT INFLUENZA A/B
Influenza A, POC: NEGATIVE
Influenza B, POC: NEGATIVE

## 2018-10-19 MED ORDER — OSELTAMIVIR PHOSPHATE 75 MG PO CAPS: 75.0000 mg | ORAL_CAPSULE | Freq: Two times a day (BID) | ORAL | 0 refills | Status: DC

## 2018-10-19 MED FILL — OSELTAMIVIR PHOSPHATE 75 MG: 75 | 5 days supply | Qty: 10 | Fill #0

## 2018-10-19 NOTE — Progress Notes (Signed)
Patient: Alison Nichols MRN: [id number] DOB: 04-05-1986 PCP: Orland Mustard, MD     Subjective:  Chief Complaint  Patient presents with  . Generalized Body Aches  . Fever  . Cough    HPI: The patient is a 33 y.o. female who presents today for fever,body aches and cough. She started running fever in the middle of the night to 101.6. She has headaches,she bodyaches and cough. She has taken ibuprofen. 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 She has not been exposed to anything at work, but her daughter tested positive for the flu on Tuesday. She has no SOB/wheezing. Her cough is dry in nature. No sinus pain/pressure. NO N/V/D. No abdominal pain. She has not really taken anything over the counter.    Review of Systems  Constitutional: Positive for chills, fatigue and fever.  HENT: Positive for ear pain, postnasal drip and sore throat. Negative for rhinorrhea, sinus pressure, sinus pain and trouble swallowing.   Eyes: Negative for photophobia.  Respiratory: Positive for cough and shortness of breath.   Cardiovascular: Negative for chest pain.  Gastrointestinal: Negative for abdominal pain, diarrhea, nausea and vomiting.  Musculoskeletal: Positive for back pain, myalgias and neck pain.  Neurological: Positive for dizziness and headaches.  Hematological: Positive for adenopathy.  Psychiatric/Behavioral: Positive for sleep disturbance.    Allergies Patient is allergic to cephalexin.  Past Medical History Patient  has a past medical history of Anxiety, Asthma, ASTHMA (01/27/2008), Depression, Ehlers-Danlos disease, Headache, Hypertension affecting pregnancy (2019), IBS (irritable bowel syndrome), Migraines, Scoliosis, and Vaginal Pap smear, abnormal.  Surgical History Patient  has a past surgical history that includes Cholecystectomy (2002); Hysteroscopy w/D&C (2004); Knee arthroscopy (1999 & 2000); Tonsillectomy; Colposcopy; and Combined hysteroscopy diagnostic / D&C (2004).  Family  History Pateint's family history includes Arthritis in her maternal grandmother; Depression in her mother; Heart attack in her maternal grandmother; Heart failure in her maternal grandmother; Hyperlipidemia in her mother; Hypertension in her maternal grandfather, maternal grandmother, and mother; Kidney disease in her maternal grandfather; Thyroid disease in her maternal grandmother.  Social History Patient  reports that she has never smoked. She has never used smokeless tobacco. She reports that she does not drink alcohol or use drugs.    Objective: Vitals:   10/19/18 0835  BP: 120/76  Pulse: 71  Temp: 98.5 F (36.9 C)  TempSrc: Oral  SpO2: 98%  Weight: 149 lb (67.6 kg)  Height: 5\' 7"  (1.702 m)    Body mass index is 23.34 kg/m.  Physical Exam Vitals signs reviewed.  Constitutional:      Appearance: Normal appearance.  HENT:     Head: Normocephalic and atraumatic.     Right Ear: Tympanic membrane, ear canal and external ear normal.     Left Ear: Tympanic membrane, ear canal and external ear normal.     Nose: Nose normal.     Mouth/Throat:     Mouth: Mucous membranes are moist.     Comments: +cobblestoning on posterior pharynx  Neck:     Musculoskeletal: Normal range of motion and neck supple.  Cardiovascular:     Rate and Rhythm: Normal rate and regular rhythm.     Heart sounds: Normal heart sounds.  Pulmonary:     Effort: Pulmonary effort is normal. No respiratory distress.     Breath sounds: Normal breath sounds. No wheezing or rales.  Abdominal:     General: Abdomen is flat. Bowel sounds are normal.     Palpations: Abdomen is soft.  Lymphadenopathy:  Cervical: No cervical adenopathy.  Skin:    General: Skin is warm.     Capillary Refill: Capillary refill takes less than 2 seconds.  Neurological:     General: No focal deficit present.     Mental Status: She is alert and oriented to person, place, and time.        Assessment/plan: 1. Flu-like  symptoms Daughter with flu and symptoms classic for flu. 5 day course of tamiflu, rest, fluids, otc cough medication. precautions given for worsening symptoms. No work until Monday.  - POCT Influenza A/B      Return if symptoms worsen or fail to improve.   Orland Mustard, MD [facility]   10/19/2018

## 2018-10-26 DIAGNOSIS — H5213 Myopia, bilateral: Secondary | ICD-10-CM | POA: Diagnosis not present

## 2018-11-07 ENCOUNTER — Encounter: Payer: Self-pay | Admitting: Family Medicine

## 2018-11-08 MED FILL — SERTRALINE HCL 100 MG TAB: 100 | 30 days supply | Qty: 30 | Fill #4

## 2018-11-11 ENCOUNTER — Other Ambulatory Visit: Payer: Self-pay

## 2018-11-11 MED ORDER — SERTRALINE HCL 100 MG PO TABS: 100.0000 mg | ORAL_TABLET | Freq: Every day | ORAL | 1 refills | Status: DC

## 2018-11-17 ENCOUNTER — Ambulatory Visit: Payer: Self-pay | Admitting: Neurology

## 2018-11-21 ENCOUNTER — Other Ambulatory Visit: Payer: Self-pay | Admitting: Family Medicine

## 2018-11-21 MED FILL — hydrOXYzine HCL 25 MG TABS: 25 | 90 days supply | Qty: 90 | Fill #0

## 2018-11-21 MED FILL — SUMATRIPTAN SUCC 100 MG TAB: 100 | 30 days supply | Qty: 9 | Fill #0

## 2018-11-22 MED FILL — BACLOFEN 10 MG TABS: 10 | 30 days supply | Qty: 90 | Fill #0

## 2018-11-22 MED FILL — ADDERALL XR 20 MG CAP SA: 20 | 30 days supply | Qty: 30 | Fill #0

## 2018-11-22 NOTE — Telephone Encounter (Signed)
Last OV 08/31/2018 Last refill Baclofen 10/16/2018 #90/0                 Adderall XR 20 mg 10/13/2018 #30/0 Next OV not scheduled.

## 2018-11-28 ENCOUNTER — Encounter: Payer: Self-pay | Admitting: Neurology

## 2019-01-11 ENCOUNTER — Other Ambulatory Visit: Payer: Self-pay | Admitting: Family Medicine

## 2019-01-11 ENCOUNTER — Encounter: Payer: Self-pay | Admitting: Family Medicine

## 2019-01-18 ENCOUNTER — Encounter: Payer: Self-pay | Admitting: Family Medicine

## 2019-01-18 ENCOUNTER — Ambulatory Visit (INDEPENDENT_AMBULATORY_CARE_PROVIDER_SITE_OTHER): Payer: 59 | Admitting: Family Medicine

## 2019-01-18 VITALS — Temp 98.4°F | Wt 147.0 lb

## 2019-01-18 DIAGNOSIS — F900 Attention-deficit hyperactivity disorder, predominantly inattentive type: Secondary | ICD-10-CM

## 2019-01-18 MED ORDER — AMPHETAMINE-DEXTROAMPHET ER 20 MG PO CP24: 20.0000 mg | ORAL_CAPSULE | Freq: Every morning | ORAL | 0 refills | Status: DC

## 2019-01-18 MED ORDER — TOPIRAMATE 50 MG PO TABS: 50.0000 mg | ORAL_TABLET | Freq: Every day | ORAL | 1 refills | Status: DC

## 2019-01-18 MED ORDER — SERTRALINE HCL 100 MG PO TABS: 100.0000 mg | ORAL_TABLET | Freq: Every day | ORAL | 1 refills | Status: DC

## 2019-01-18 MED FILL — TOPIRAMATE 50 MG TABLET: 50 | 90 days supply | Qty: 90 | Fill #0

## 2019-01-18 MED FILL — ADDERALL XR 20 MG CAP SA: 20 | 30 days supply | Qty: 30 | Fill #0

## 2019-01-18 MED FILL — SERTRALINE HCL 100 MG TAB: 100 | 90 days supply | Qty: 90 | Fill #0

## 2019-01-18 NOTE — Progress Notes (Signed)
Patient: Alison Nichols MRN: [id number] DOB: [birth year] PCP: Orland MustardWolfe, Epiphany Seltzer, MD    I connected with Alison Nichols at 13:22 by a video enabled telemedicine application and verified that I am speaking with the correct person using two identifiers.  Location patient: Home Location provider: [facility], Office Persons participating in this virtual visit: Alison Nichols and Dr. Artis FlockWolfe   I discussed the limitations of evaluation and management by telemedicine and the availability of in person appointments. The patient expressed understanding and agreed to proceed.   Subjective:  Chief Complaint  Patient presents with  . Medication Refill    HPI: The patient is a 33 y.o. female who presents today for ADHD follow up. Currently on Adderall XR 20mg  daily with 5mg  IR prn. We added on the 5mg  IR at her last visit. She is quite overdue for an appointment. Last seen in January of 2020. She is doing well with her medication. Takes drug holidays on the weekends so her 30 day px usually lasts about 45 days. 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 No side effects and sleeping well. She is needing a refill of her adderall XR.   -also requesting refill of her topamax. Appointment with neurology scheduled for July of this year for migraines.   Review of Systems  Constitutional: Negative for fatigue and unexpected weight change.  Eyes: Negative for visual disturbance.  Respiratory: Negative for shortness of breath.   Cardiovascular: Negative for chest pain and palpitations.  Gastrointestinal: Negative for abdominal pain, diarrhea and vomiting.  Neurological: Negative for dizziness and headaches.  Psychiatric/Behavioral: Negative for agitation, decreased concentration and sleep disturbance. The patient is not nervous/anxious and is not hyperactive.     Allergies Patient is allergic to cephalexin.  Past Medical History Patient  has a past medical history  of Anxiety, Asthma, ASTHMA (01/27/2008), Depression, Ehlers-Danlos disease, Headache, Hypertension affecting pregnancy (2019), IBS (irritable bowel syndrome), Migraines, Scoliosis, and Vaginal Pap smear, abnormal.  Surgical History Patient  has a past surgical history that includes Cholecystectomy (2002); Hysteroscopy w/D&C (2004); Knee arthroscopy (1999 & 2000); Tonsillectomy; Colposcopy; and Combined hysteroscopy diagnostic / D&C (2004).  Family History Pateint's family history includes Arthritis in her maternal grandmother; Depression in her mother; Heart attack in her maternal grandmother; Heart failure in her maternal grandmother; Hyperlipidemia in her mother; Hypertension in her maternal grandfather, maternal grandmother, and mother; Kidney disease in her maternal grandfather; Thyroid disease in her maternal grandmother.  Social History Patient  reports that she has never smoked. She has never used smokeless tobacco. She reports that she does not drink alcohol or use drugs.    Objective: Vitals:   01/18/19 1312  Temp: 98.4 F (36.9 C)  TempSrc: Oral  Weight: 147 lb (66.7 kg)    Body mass index is 23.02 kg/m.  Physical Exam Vitals signs reviewed.  Constitutional:      Appearance: Normal appearance.  HENT:     Head: Normocephalic and atraumatic.  Eyes:     Extraocular Movements: Extraocular movements intact.  Pulmonary:     Effort: Pulmonary effort is normal.  Neurological:     General: No focal deficit present.     Mental Status: She is alert and oriented to person, place, and time.  Psychiatric:        Mood and Affect: Mood normal.        Behavior: Behavior normal.        Assessment/plan: 1. ADHD, predominantly inattentive type PMP website  checked and verified and patient is taking as prescribed. No side effects and medication working well. Refills given. F/u in 3-4 months or as needed.   -refilled topamax. F/u with neurology for injectable.    Return in about 4  months (around 05/21/2019).   Orland Mustard, MD [facility]   01/18/2019

## 2019-01-22 DIAGNOSIS — U071 COVID-19: Secondary | ICD-10-CM

## 2019-01-22 HISTORY — DX: COVID-19: U07.1

## 2019-01-24 MED FILL — SUMATRIPTAN SUCC 100 MG TAB: 100 | 30 days supply | Qty: 9 | Fill #1

## 2019-02-10 ENCOUNTER — Encounter: Payer: Self-pay | Admitting: Family Medicine

## 2019-02-12 ENCOUNTER — Encounter: Payer: Self-pay | Admitting: Family Medicine

## 2019-02-12 LAB — NOVEL CORONAVIRUS, NAA: SARS-CoV-2, NAA: POSITIVE

## 2019-02-19 MED FILL — SUMATRIPTAN SUCC 100 MG TAB: 100 | 30 days supply | Qty: 9 | Fill #1

## 2019-02-28 ENCOUNTER — Other Ambulatory Visit: Payer: Self-pay | Admitting: Family Medicine

## 2019-02-28 MED ORDER — AMPHETAMINE-DEXTROAMPHET ER 20 MG PO CP24: 20.0000 mg | ORAL_CAPSULE | Freq: Every morning | ORAL | 0 refills | Status: DC

## 2019-02-28 MED FILL — ADDERALL XR 20 MG CAP SA: 20 | 30 days supply | Qty: 30 | Fill #0

## 2019-02-28 NOTE — Telephone Encounter (Signed)
Pt requesting refill. Dr. Shelby Mattocks patient, last OV 01/18/19.  Controlled substance: Adderall XR 20 mg one capsule daily  Pharmacy: [facility]   Status : Last Rx filled 01/18/19, # 30 for 30 days with 0 refill.  New Findings : None   Additional Comments:

## 2019-03-05 ENCOUNTER — Telehealth: Payer: 59 | Admitting: Neurology

## 2019-03-05 ENCOUNTER — Encounter: Payer: Self-pay | Admitting: Family Medicine

## 2019-03-06 ENCOUNTER — Ambulatory Visit (INDEPENDENT_AMBULATORY_CARE_PROVIDER_SITE_OTHER)
Admission: RE | Admit: 2019-03-06 | Discharge: 2019-03-06 | Disposition: A | Discharge status: Nursing Home (Includes ICF) | Payer: 59 | Source: Ambulatory Visit

## 2019-03-06 ENCOUNTER — Other Ambulatory Visit: Payer: Self-pay

## 2019-03-06 ENCOUNTER — Encounter: Payer: Self-pay | Admitting: Emergency Medicine

## 2019-03-06 ENCOUNTER — Ambulatory Visit (INDEPENDENT_AMBULATORY_CARE_PROVIDER_SITE_OTHER): Payer: 59

## 2019-03-06 ENCOUNTER — Ambulatory Visit
Admission: EM | Admit: 2019-03-06 | Discharge: 2019-03-06 | Disposition: A | Discharge status: Home / Self Care | Payer: 59 | Attending: Family Medicine | Admitting: Family Medicine

## 2019-03-06 DIAGNOSIS — R0602 Shortness of breath: Secondary | ICD-10-CM

## 2019-03-06 DIAGNOSIS — R059 Cough, unspecified: Secondary | ICD-10-CM

## 2019-03-06 DIAGNOSIS — R05 Cough: Secondary | ICD-10-CM

## 2019-03-06 MED ORDER — ALBUTEROL SULFATE HFA 108 (90 BASE) MCG/ACT IN AERS: 1.0000 | INHALATION_SPRAY | Freq: Four times a day (QID) | RESPIRATORY_TRACT | 2 refills | Status: DC | PRN

## 2019-03-06 MED ORDER — PREDNISONE 10 MG (21) PO TBPK: ORAL_TABLET | Freq: Every day | ORAL | 0 refills | Status: DC

## 2019-03-06 NOTE — Telephone Encounter (Signed)
Spoke to pt told her I can have her see someone today would she like virtual visit or in office. Pt said she would like in office she was tested positive for COVID 4 weeks ago and is having lingering symptoms. Told pt would need to be virtual visit then. Pt said she is already scheduled today for e-visit virtually and will just keep that being as she can not be seen in office. Told her okay if need anything else please let us know. Pt verbalized understanding.

## 2019-03-06 NOTE — ED Triage Notes (Signed)
Pt presents after video visit earlier for chest xray, Dr. Mannie Stabile aware

## 2019-03-06 NOTE — Discharge Instructions (Signed)
Patient would like further evaluation and work-up in person.   Concerned for possible pneumonia and would like a chest x-ray.   Going by private vehicle to [facility]

## 2019-03-06 NOTE — ED Provider Notes (Signed)
[facility] Virtual Visit via Video Note:  Alison OrleansMeredith B Kranz  initiated request for Telemedicine visit with [facility] team. I connected with Alison OrleansMeredith B Krupinski  on 03/06/2019 at 2:49 PM  for a synchronized telemedicine visit using a video enabled HIPPA compliant telemedicine application. I verified that I am speaking with Alison OrleansMeredith B Sheehan  using two identifiers. Rennis HardingBrittany Davarious Tumbleson, PA-C  was physically located in a [facility] site and Alison OrleansMeredith B Zanders was located at a different location.   The limitations of evaluation and management by telemedicine as well as the availability of in-person appointments were discussed. Patient was informed that she  may incur a bill ( including co-pay) for this virtual visit encounter. Alison OrleansMeredith B Gass  expressed understanding and gave verbal consent to proceed with virtual visit.   [id number] 03/06/19 Arrival Time: 1429  CC: SOB and cough  SUBJECTIVE: History from: patient.  Alison Nichols is a 33 y.o. female hx significant for anxiety, asthma, depression, ehlers-danlos, HA, IBS, migraines, and scoliosis, who presents with SOB and mild intermittent cough x 1 week.  Reports testing positive for COVID 1 month ago.  Denies recent sick exposure to COVID, flu or strep.  Denies recent travel.  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  Describes SOB as if she cannot get a deep breath.  Has tried inhaler with minimal relief.  Symptoms are made worse with wearing a mask.  Reports possible similar symptoms in the past with asthma exacerbations as a child.  Denies fever, chills, fatigue,  rhinorrhea, congestion, sore throat, wheezing, chest pain, chest pressure, nausea, changes in bowel or bladder habits.    ROS: As per HPI.  Past Medical History:  Diagnosis Date  . Anxiety   . Asthma   . ASTHMA 01/27/2008   Qualifier: Diagnosis of   By: Thereasa SoloMonahan-Sigman, Cathy    . Depression   . Ehlers-Danlos disease   . Headache   . Hypertension affecting pregnancy 2019  . IBS (irritable bowel syndrome)   . Migraines   . Scoliosis   . Vaginal Pap smear, abnormal    Past Surgical History:  Procedure Laterality Date  . CHOLECYSTECTOMY  2002  . COLPOSCOPY    . COMBINED HYSTEROSCOPY DIAGNOSTIC / D&C  2004  . HYSTEROSCOPY W/D&C  2004  . KNEE ARTHROSCOPY  1999 & 2000  . TONSILLECTOMY     w/ Adenoidectomy   Allergies  Allergen Reactions  . Cephalexin Hives   No current facility-administered medications on file prior to encounter.    Current Outpatient Medications on File Prior to Encounter  Medication Sig Dispense Refill  . Albuterol Sulfate (PROAIR RESPICLICK) 108 (90 Base) MCG/ACT AEPB Inhale 2 puffs into the lungs every 4 (four) hours as needed. 1 each 5  . amphetamine-dextroamphetamine (ADDERALL XR) 20 MG 24 hr capsule Take 1 capsule (20 mg total) by mouth every morning. 30 capsule 0  . amphetamine-dextroamphetamine (ADDERALL) 5 MG tablet Take 1 tablet (5 mg total) by mouth daily. 30 tablet 0  . baclofen (LIORESAL) 10 MG tablet TAKE 1 TABLET (10 MG TOTAL) BY MOUTH 3 (THREE) TIMES DAILY. 90 tablet 0  . fluticasone (FLONASE) 50 MCG/ACT nasal spray Place 1 spray into both nostrils 2 (two) times daily. 16 g 3  . hydrOXYzine (ATARAX/VISTARIL) 25 MG tablet Take 1 tablet (25 mg total) by mouth at bedtime as needed for itching.  90 tablet 1  . sertraline (ZOLOFT) 100 MG tablet Take 1 tablet (100 mg total) by mouth daily. 90 tablet 1  . SUMAtriptan (IMITREX) 100 MG tablet Take 100mg  by mouth at onset of headache. May repeat in 2 hours if headache persists or recurs. No more than 2/24 hours. 10 tablet 3  . topiramate (TOPAMAX) 50 MG tablet Take 1 tablet (50 mg total) by mouth daily. 90 tablet 1   OBJECTIVE:  There were no vitals filed for this visit.  General appearance: alert; no distress Eyes: EOMI grossly HENT: normocephalic;  atraumatic Neck: supple with FROM Lungs: normal respiratory effort; speaking in full sentences without difficulty Extremities: moves extremities without difficulty Skin: No obvious rashes Neurologic: No facial asymmetries Psychological: alert and cooperative; normal mood and affect  ASSESSMENT & PLAN:  1. Shortness of breath   2. Cough     Patient would like further evaluation and work-up in person.   Concerned for possible pneumonia and would like a chest x-ray.   Going by private vehicle to [facility]  I discussed the assessment and treatment plan with the patient. The patient was provided an opportunity to ask questions and all were answered. The patient agreed with the plan and demonstrated an understanding of the instructions.   I provided 15 minutes of non-face-to-face time during this encounter.  Lestine Box, PA-C  03/06/2019 2:49 PM          Lestine Box, PA-C 03/06/19 1454

## 2019-03-06 NOTE — ED Notes (Signed)
Patient able to ambulate independently  

## 2019-03-07 MED FILL — ALBUTEROL SULFATE HFA 108 (: 108 (90 BAS | 25 days supply | Qty: 18 | Fill #0

## 2019-03-07 MED FILL — predniSONE 10 MG TABS: 10 | 6 days supply | Qty: 21 | Fill #0

## 2019-03-07 NOTE — ED Provider Notes (Signed)
[facility]   [id number] 03/06/19 Arrival Time: 4742  ASSESSMENT & PLAN:  1. Shortness of breath    I have personally viewed the imaging studies ordered this visit. No infiltrate or pneumothorax identified.  Trial of: Meds ordered this encounter  Medications  . predniSONE (STERAPRED UNI-PAK 21 TAB) 10 MG (21) TBPK tablet    Sig: Take by mouth daily. Take as directed.    Dispense:  21 tablet    Refill:  0  . albuterol (VENTOLIN HFA) 108 (90 Base) MCG/ACT inhaler    Sig: Inhale 1-2 puffs into the lungs every 6 (six) hours as needed for wheezing or shortness of breath.    Dispense:  18 g    Refill:  2    Follow-up Information    Orma Flaming, MD.   Specialty: Family Medicine Why: As needed. Contact information: [place] [address] [phone]          May f/u here if needed.  Reviewed expectations re: course of current medical issues. Questions answered. Outlined signs and symptoms indicating need for more acute intervention. Patient verbalized understanding. After Visit Summary given.   SUBJECTIVE: History from: patient. Alison Nichols is a 33 y.o. female. Video visit dated today reviewed. Briefly, dx with COVID-19 4 weeks ago. Feeling better. Last week began feeling sporadic SOB, worse with exertion and when wearing a mask at work. No associated CP/n/v/back pain. H/O asthma as a child. Does not feel like she's been wheezing. Afebrile. Sleeps well. SOB does not wake her. Otherwise feeling normal. Has an old albuterol inhaler that she has used "but not sure it had any medicine left in it".  Social History   Tobacco Use  Smoking Status Never Smoker  Smokeless Tobacco Never Used   ROS: As per HPI. All other systems negative.    OBJECTIVE:  Vitals:   03/06/19 1724  BP: (!) 145/64  Pulse: 85  Resp: 18  Temp: 98.2 F (36.8 C)  TempSrc: Oral  SpO2: 98%    General appearance: alert; no distress Eyes: PERRLA; EOMI;  conjunctiva normal HENT: normocephalic; atraumatic; no nasal congestion Neck: supple  Lungs: clear to auscultation bilaterally; unlabored Heart: regular rate and rhythm without murmer Abdomen: soft Extremities: no edema Skin: warm and dry Neurologic: normal gait Psychological: alert and cooperative; normal mood and affect  Imaging: Dg Chest 2 View  Result Date: 03/06/2019 CLINICAL DATA:  Shortness of breath, cough EXAM: CHEST - 2 VIEW COMPARISON:  None FINDINGS: Heart and mediastinal contours are within normal limits. No focal opacities or effusions. No acute bony abnormality. IMPRESSION: No active cardiopulmonary disease. Electronically Signed   By: Rolm Baptise M.D.   On: 03/06/2019 18:42    Allergies  Allergen Reactions  . Cephalexin Hives    Past Medical History:  Diagnosis Date  . Anxiety   . Asthma   . ASTHMA 01/27/2008   Qualifier: Diagnosis of  By: Jerral Ralph    . Depression   . Ehlers-Danlos disease   . Headache   . Hypertension affecting pregnancy 2019  . IBS (irritable bowel syndrome)   . Migraines   . Scoliosis   . Vaginal Pap smear, abnormal    Social History   Socioeconomic History  . Marital status: Married    Spouse name: Not on file  . Number of children: Not on file  . Years of education: Not on file  . Highest education level: Not on file  Occupational History  . Not  on file  Social Needs  . Financial resource strain: Not on file  . Food insecurity    Worry: Not on file    Inability: Not on file  . Transportation needs    Medical: Not on file    Non-medical: Not on file  Tobacco Use  . Smoking status: Never Smoker  . Smokeless tobacco: Never Used  Substance and Sexual Activity  . Alcohol use: No  . Drug use: No  . Sexual activity: Yes    Birth control/protection: None  Lifestyle  . Physical activity    Days per week: Not on file    Minutes per session: Not on file  . Stress: Not on file  Relationships  . Social  Musicianconnections    Talks on phone: Not on file    Gets together: Not on file    Attends religious service: Not on file    Active member of club or organization: Not on file    Attends meetings of clubs or organizations: Not on file    Relationship status: Not on file  . Intimate partner violence    Fear of current or ex partner: Not on file    Emotionally abused: Not on file    Physically abused: Not on file    Forced sexual activity: Not on file  Other Topics Concern  . Not on file  Social History Narrative  . Not on file   Family History  Problem Relation Age of Onset  . Heart failure Maternal Grandmother   . Hypertension Maternal Grandmother   . Thyroid disease Maternal Grandmother   . Arthritis Maternal Grandmother   . Heart attack Maternal Grandmother   . Depression Mother   . Hyperlipidemia Mother   . Hypertension Mother   . Hypertension Maternal Grandfather   . Kidney disease Maternal Grandfather    Past Surgical History:  Procedure Laterality Date  . CHOLECYSTECTOMY  2002  . COLPOSCOPY    . COMBINED HYSTEROSCOPY DIAGNOSTIC / D&C  2004  . HYSTEROSCOPY W/D&C  2004  . KNEE ARTHROSCOPY  1999 & 2000  . TONSILLECTOMY     w/ Adenoidectomy     Mardella LaymanHagler, Damani Rando, MD 03/07/19 [phone]

## 2019-04-04 ENCOUNTER — Other Ambulatory Visit: Payer: Self-pay | Admitting: Family Medicine

## 2019-04-04 MED ORDER — AMPHETAMINE-DEXTROAMPHET ER 20 MG PO CP24: 20.0000 mg | ORAL_CAPSULE | Freq: Every morning | ORAL | 0 refills | Status: DC

## 2019-04-04 MED FILL — ADDERALL XR 20 MG CAP SA: 20 | 30 days supply | Qty: 30 | Fill #0

## 2019-04-04 NOTE — Telephone Encounter (Signed)
Last OV 01/18/2019  Last refill 02/28/19 #30/0

## 2019-04-12 MED FILL — SUMATRIPTAN SUCC 100 MG TAB: 100 | 30 days supply | Qty: 9 | Fill #2

## 2019-04-18 MED FILL — ADDERALL XR 20 MG CAP SA: 20 | 30 days supply | Qty: 30 | Fill #0

## 2019-04-25 MED FILL — SERTRALINE HCL 100 MG TAB: 100 | 90 days supply | Qty: 90 | Fill #1

## 2019-05-01 ENCOUNTER — Other Ambulatory Visit: Payer: Self-pay

## 2019-05-01 ENCOUNTER — Encounter: Payer: Self-pay | Admitting: Family Medicine

## 2019-05-01 MED ORDER — HYDROXYZINE HCL 25 MG PO TABS: 25.0000 mg | ORAL_TABLET | Freq: Every evening | ORAL | 1 refills | Status: DC | PRN

## 2019-05-01 MED FILL — hydrOXYzine HCL 25 MG TABS: 25 | 90 days supply | Qty: 90 | Fill #0

## 2019-05-07 MED FILL — SERTRALINE HCL 100 MG TAB: 100 | 90 days supply | Qty: 90 | Fill #1

## 2019-05-11 ENCOUNTER — Encounter: Payer: Self-pay | Admitting: Family Medicine

## 2019-05-11 ENCOUNTER — Other Ambulatory Visit: Payer: Self-pay

## 2019-05-11 ENCOUNTER — Ambulatory Visit: Payer: 59 | Admitting: Family Medicine

## 2019-05-11 VITALS — BP 118/82 | HR 75 | Temp 98.3°F | Ht 67.0 in | Wt 147.6 lb

## 2019-05-11 DIAGNOSIS — F900 Attention-deficit hyperactivity disorder, predominantly inattentive type: Secondary | ICD-10-CM | POA: Diagnosis not present

## 2019-05-11 DIAGNOSIS — G43009 Migraine without aura, not intractable, without status migrainosus: Secondary | ICD-10-CM

## 2019-05-11 MED ORDER — AMPHETAMINE-DEXTROAMPHET ER 30 MG PO CP24: 30.0000 mg | ORAL_CAPSULE | Freq: Every day | ORAL | 0 refills | Status: DC

## 2019-05-11 MED ORDER — AIMOVIG 70 MG/ML ~~LOC~~ SOAJ: 70.0000 mg | SUBCUTANEOUS | 11 refills | Status: DC

## 2019-05-11 MED ORDER — BACLOFEN 10 MG PO TABS: 10.0000 mg | ORAL_TABLET | Freq: Three times a day (TID) | ORAL | 1 refills | Status: DC

## 2019-05-11 MED ORDER — SUMATRIPTAN SUCCINATE 100 MG PO TABS: ORAL_TABLET | ORAL | 3 refills | Status: DC

## 2019-05-11 MED FILL — ADDERALL XR 30 MG CAP SA: 30 | 30 days supply | Qty: 30 | Fill #0

## 2019-05-11 MED FILL — BACLOFEN 10 MG TABS: 10 | 30 days supply | Qty: 90 | Fill #0

## 2019-05-11 MED FILL — SUMATRIPTAN SUCC 100 MG TAB: 100 | 30 days supply | Qty: 9 | Fill #0

## 2019-05-11 NOTE — Progress Notes (Signed)
Patient: Alison OrleansMeredith B Bokhari MRN: [id number] DOB: [birth year] PCP: Orland MustardWolfe, Keonda Dow, MD     Subjective:  Chief Complaint  Patient presents with  . Medication Refill  . Headache  . ADHD    HPI: The patient is a 33 y.o. female who presents today for medication refills and discuss stopping Topamax.   ADHD: she is currently on adderall xr 20mg /day and she will occasionally take a 5mg  in the evening. She feels like this dose isn't working as well as it used to work. She does have chronic headaches, but does not feel like the medication is contributing. She was having headaches even off medication. No N/V/d, no weight loss. Filling correclty.   Migraines: has been on topamax for 9 months. We have titrated it up and she is having no benefits from this. Interested in injectable.   Review of Systems  Constitutional: Positive for fatigue.  HENT: Negative for congestion, postnasal drip, rhinorrhea and sore throat.   Eyes: Negative for visual disturbance.  Respiratory: Positive for shortness of breath.        C/o intermittent SOB on and off x 2 months  Cardiovascular: Negative for chest pain, palpitations and leg swelling.  Gastrointestinal: Negative for abdominal pain, diarrhea, nausea and vomiting.  Musculoskeletal: Negative for back pain and neck pain.  Neurological: Positive for headaches. Negative for dizziness.  Psychiatric/Behavioral: Positive for sleep disturbance.    Allergies Patient is allergic to cephalexin.  Past Medical History Patient  has a past medical history of Anxiety, Asthma, ASTHMA (01/27/2008), Depression, Ehlers-Danlos disease, Headache, Hypertension affecting pregnancy (2019), IBS (irritable bowel syndrome), Migraines, Scoliosis, and Vaginal Pap smear, abnormal.  Surgical History Patient  has a past surgical history that includes Cholecystectomy (2002); Hysteroscopy w/D&C (2004); Knee arthroscopy (1999 & 2000); Tonsillectomy; Colposcopy; and Combined hysteroscopy  diagnostic / D&C (2004).  Family History Pateint's family history includes Arthritis in her maternal grandmother; Depression in her mother; Heart attack in her maternal grandmother; Heart failure in her maternal grandmother; Hyperlipidemia in her mother; Hypertension in her maternal grandfather, maternal grandmother, and mother; Kidney disease in her maternal grandfather; Thyroid disease in her maternal grandmother.  Social History Patient  reports that she has never smoked. She has never used smokeless tobacco. She reports that she does not drink alcohol or use drugs.    Objective: Vitals:   05/11/19 1442  BP: 118/82  Pulse: 75  Temp: 98.3 F (36.8 C)  TempSrc: Skin  SpO2: 98%  Weight: 147 lb 9.6 oz (67 kg)  Height: 5\' 7"  (1.702 m)    Body mass index is 23.12 kg/m.  Physical Exam Vitals signs reviewed.  Constitutional:      Appearance: She is well-developed.  HENT:     Head: Normocephalic and atraumatic.     Mouth/Throat:     Mouth: Mucous membranes are moist.     Pharynx: Oropharynx is clear.  Eyes:     Extraocular Movements: Extraocular movements intact.     Pupils: Pupils are equal, round, and reactive to light.  Neck:     Musculoskeletal: Normal range of motion and neck supple.  Cardiovascular:     Rate and Rhythm: Normal rate and regular rhythm.     Heart sounds: Normal heart sounds.  Pulmonary:     Effort: Pulmonary effort is normal.     Breath sounds: Normal breath sounds.  Abdominal:     General: Bowel sounds are normal.     Palpations: Abdomen is soft.  Skin:    General: Skin  is warm and dry.  Neurological:     Mental Status: She is alert and oriented to person, place, and time.     Cranial Nerves: No cranial nerve deficit.  Psychiatric:        Mood and Affect: Mood normal.        Behavior: Behavior normal.        GAD 7 : Generalized Anxiety Score 05/11/2019  Nervous, Anxious, on Edge 1  Control/stop worrying 1  Worry too much - different things  1  Trouble relaxing 0  Restless 0  Easily annoyed or irritable 1  Afraid - awful might happen 0  Total GAD 7 Score 4  Anxiety Difficulty Not difficult at all  Some encounter information is confidential and restricted. Go to Review Flowsheets activity to see all data.     Assessment/plan: 1. ADHD, predominantly inattentive type pmp website checked and filling correctly. Will increase her adderall xr to 30mg  and have her f/u in 3 months. Try to start exercising as well. Let me know if any issues.   2. Migraine without aura and without status migrainosus, not intractable Failed topomax. aimovig will be done. Good candidate and discussed that only contraindication is pregnancy and adverse reaction to injection. Will do injectable in office for first month and then she can self administer if no issue. F/u in 3 months for monitoring.    Return if symptoms worsen or fail to improve, for annual and 3 months for h/a and adhd .   Orma Flaming, MD [place]   05/11/2019

## 2019-05-16 DIAGNOSIS — Z30431 Encounter for routine checking of intrauterine contraceptive device: Secondary | ICD-10-CM | POA: Diagnosis not present

## 2019-05-16 DIAGNOSIS — T8332XA Displacement of intrauterine contraceptive device, initial encounter: Secondary | ICD-10-CM | POA: Diagnosis not present

## 2019-05-16 DIAGNOSIS — Z30432 Encounter for removal of intrauterine contraceptive device: Secondary | ICD-10-CM | POA: Diagnosis not present

## 2019-05-17 MED FILL — DOXYCYCLINE HYC 100 MG CAPS: 100 | 7 days supply | Qty: 14 | Fill #0

## 2019-05-18 MED FILL — AIMOVIG 70 MG/ML SOAJ: 70 | 30 days supply | Qty: 1 | Fill #0

## 2019-05-21 MED FILL — LO LOESTRIN FE 1-10 TABLET: 1 MG-10 MCG | 28 days supply | Qty: 28 | Fill #0

## 2019-05-23 ENCOUNTER — Encounter: Payer: Self-pay | Admitting: Family Medicine

## 2019-06-05 ENCOUNTER — Ambulatory Visit (INDEPENDENT_AMBULATORY_CARE_PROVIDER_SITE_OTHER): Payer: 59 | Admitting: Orthopaedic Surgery

## 2019-06-05 ENCOUNTER — Ambulatory Visit (INDEPENDENT_AMBULATORY_CARE_PROVIDER_SITE_OTHER): Payer: 59

## 2019-06-05 ENCOUNTER — Encounter: Payer: Self-pay | Admitting: Orthopaedic Surgery

## 2019-06-05 ENCOUNTER — Other Ambulatory Visit: Payer: Self-pay

## 2019-06-05 DIAGNOSIS — G8929 Other chronic pain: Secondary | ICD-10-CM

## 2019-06-05 DIAGNOSIS — M25562 Pain in left knee: Secondary | ICD-10-CM

## 2019-06-05 MED ORDER — MELOXICAM 7.5 MG PO TABS: 7.5000 mg | ORAL_TABLET | Freq: Two times a day (BID) | ORAL | 2 refills | Status: DC | PRN

## 2019-06-05 MED FILL — MELOXICAM 7.5 MG TABLET: 7.5 | 15 days supply | Qty: 30 | Fill #0

## 2019-06-05 NOTE — Progress Notes (Signed)
Office Visit Note   Patient: Alison Nichols           Date of Birth: May 05, 1986           MRN: [id number] Visit Date: 06/05/2019              Requested by: Orland Mustard, MD [address] [place],  [place] [address] PCP: Orland Mustard, MD   Assessment & Plan: Visit Diagnoses:  1. Chronic pain of left knee     Plan: Impression is chronic left knee pain.  Questionable medial plica and quadriceps tendinitis.  Doubt meniscal tear.  I have placed patient on meloxicam and outpatient PT for strengthening.  She will let me know if she does not get any better so that we can order an MRI.  Questions encouraged and answered.  Follow-up as needed.  Follow-Up Instructions: Return if symptoms worsen or fail to improve.   Orders:  Orders Placed This Encounter  Procedures  . XR KNEE 3 VIEW LEFT   Meds ordered this encounter  Medications  . meloxicam (MOBIC) 7.5 MG tablet    Sig: Take 1 tablet (7.5 mg total) by mouth 2 (two) times daily as needed for pain.    Dispense:  30 tablet    Refill:  2      Procedures: No procedures performed   Clinical Data: No additional findings.   Subjective: Chief Complaint  Patient presents with  . 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  She endorses some popping and cracking.  Voltaren gel has not helped.  She does have history of Ehlers-Danlos syndrome.  She is status post right knee arthroscopy with lateral meniscus tear several years ago.  She states that it has been occasionally swollen.  She wakes up with pain now with difficulty with stairs.  Denies any true mechanical symptoms.   Review of Systems  Constitutional: Negative.   HENT: Negative.   Eyes: Negative.   Respiratory: Negative.   Cardiovascular: Negative.   Endocrine: Negative.   Musculoskeletal: Negative.   Neurological: Negative.   Hematological:  Negative.   Psychiatric/Behavioral: Negative.   All other systems reviewed and are negative.    Objective: Vital Signs: There were no vitals taken for this visit.  Physical Exam Vitals signs and nursing note reviewed.  Constitutional:      Appearance: She is well-developed.  HENT:     Head: Normocephalic and atraumatic.  Neck:     Musculoskeletal: Neck supple.  Pulmonary:     Effort: Pulmonary effort is normal.  Abdominal:     Palpations: Abdomen is soft.  Skin:    General: Skin is warm.     Capillary Refill: Capillary refill takes less than 2 seconds.  Neurological:     Mental Status: She is alert and oriented to person, place, and time.  Psychiatric:        Behavior: Behavior normal.        Thought Content: Thought content normal.        Judgment: Judgment normal.     Ortho Exam Left knee exam shows no joint effusion.  No tenderness to palpation along the joint line.  She has pain deep to the medial retinaculum and superior medial patellar region.  She has good knee extension strength with some mild pain.  Collaterals and cruciates are stable.  Specialty Comments:  No specialty comments available.  Imaging: Xr Knee 3 View Left  Result Date: 06/05/2019 No acute or structural abnormalities    PMFS History: Patient Active Problem List   Diagnosis Date Noted  . 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 Anxiety 03/13/2018  . Gestational hypertension 02/21/2018  . EHLERS-DANLOS SYNDROME 09/01/2009  . Mild asthma without complication 22/09/5425  . GERD 01/27/2008  . Migraine headache without aura 01/27/2008  . HEMORRHOIDS, EXTERNAL 02/18/2006   Past Medical History:  Diagnosis Date  . Anxiety   . Asthma   . ASTHMA 01/27/2008   Qualifier: Diagnosis of  By: Jerral Ralph    . Depression   . Ehlers-Danlos disease   . Headache   . Hypertension affecting pregnancy 2019  . IBS (irritable bowel syndrome)   . Migraines   . Scoliosis   . Vaginal  Pap smear, abnormal     Family History  Problem Relation Age of Onset  . Heart failure Maternal Grandmother   . Hypertension Maternal Grandmother   . Thyroid disease Maternal Grandmother   . Arthritis Maternal Grandmother   . Heart attack Maternal Grandmother   . Depression Mother   . Hyperlipidemia Mother   . Hypertension Mother   . Hypertension Maternal Grandfather   . Kidney disease Maternal Grandfather     Past Surgical History:  Procedure Laterality Date  . CHOLECYSTECTOMY  2002  . COLPOSCOPY    . COMBINED HYSTEROSCOPY DIAGNOSTIC / D&C  2004  . HYSTEROSCOPY W/D&C  2004  . KNEE ARTHROSCOPY  1999 & 2000  . TONSILLECTOMY     w/ Adenoidectomy   Social History   Occupational History  . Not on file  Tobacco Use  . Smoking status: Never Smoker  . Smokeless tobacco: Never Used  Substance and Sexual Activity  . Alcohol use: No  . Drug use: No  . Sexual activity: Yes    Birth control/protection: None

## 2019-06-06 ENCOUNTER — Encounter: Payer: Self-pay | Admitting: Physical Therapy

## 2019-06-06 ENCOUNTER — Ambulatory Visit: Payer: 59 | Attending: Orthopaedic Surgery | Admitting: Physical Therapy

## 2019-06-06 ENCOUNTER — Other Ambulatory Visit: Payer: Self-pay

## 2019-06-06 DIAGNOSIS — M25562 Pain in left knee: Secondary | ICD-10-CM | POA: Insufficient documentation

## 2019-06-06 DIAGNOSIS — M6281 Muscle weakness (generalized): Secondary | ICD-10-CM | POA: Insufficient documentation

## 2019-06-06 NOTE — Therapy (Addendum)
[facility] [address] [place], [place], [address] Phone: [phone]   Fax:  [phone]  Physical Therapy Evaluation  Patient Details  Name: Alison Nichols MRN: [id number] Date of Birth: 12/21/1985 Referring Provider (PT): Leandrew Koyanagi, MD   Encounter Date: 06/06/2019  PT End of Session - 06/06/19 1444    Visit Number  1    Date for PT Re-Evaluation  08/01/19    PT Start Time  6269    PT Stop Time  1520    PT Time Calculation (min)  36 min    Activity Tolerance  Patient tolerated treatment well       Past Medical History:  Diagnosis Date  . Anxiety   . Asthma   . ASTHMA 01/27/2008   Qualifier: Diagnosis of  By: Jerral Ralph    . Depression   . Ehlers-Danlos disease   . Headache   . Hypertension affecting pregnancy 2019  . IBS (irritable bowel syndrome)   . Migraines   . Scoliosis   . Vaginal Pap smear, abnormal     Past Surgical History:  Procedure Laterality Date  . CHOLECYSTECTOMY  2002  . COLPOSCOPY    . COMBINED HYSTEROSCOPY DIAGNOSTIC / D&C  2004  . HYSTEROSCOPY W/D&C  2004  . KNEE ARTHROSCOPY  1999 & 2000  . TONSILLECTOMY     w/ Adenoidectomy    There were no vitals filed for this visit.   Subjective Assessment - 06/06/19 1445    Subjective  Pt has had knee pain for about 8 weeks and just hasn't gotten better.  When it is bent and standing from sitting or going up steps is when I feel it the most.    Pertinent History  Ehlers-danlos syndrom    Limitations  Sitting    Patient Stated Goals  feel stronger and more stable in Lt knee/hip    Currently in Pain?  Yes   not currently   Pain Score  7    during the movement   Pain Location  Knee    Pain Orientation  Left    Pain Descriptors / Indicators  Aching;Sharp    Pain Type  Acute pain    Pain Radiating Towards  right around superior patella and under the patellar tendon    Pain Onset  More than a month ago    Pain  Frequency  Intermittent    Aggravating Factors   wakes a couple times at night, going sitting to standing, up/down stairs, squatting    Pain Relieving Factors  positioning, ice    Effect of Pain on Daily Activities  no    Multiple Pain Sites  No         [facility] PT Assessment - 06/07/19 0001      Assessment   Medical Diagnosis  Lt knee pain    Referring Provider (PT)  Leandrew Koyanagi, MD    Prior Therapy  No      Precautions   Precautions  None      Restrictions   Weight Bearing Restrictions  No      Balance Screen   Has the patient fallen in the past 6 months  No      [place] residence    Living Arrangements  Spouse/significant other;Children      Prior Function   Level of Independence  Independent    Vocation  Full time employment  Vocation Requirements  RN      Cognition   Overall Cognitive Status  Within Functional Limits for tasks assessed      Observation/Other Assessments   Focus on Therapeutic Outcomes (FOTO)   40% limited      Posture/Postural Control   Posture/Postural Control  No significant limitations      AROM   Overall AROM Comments  WFL - hypermobile hip and knee      Strength   Overall Strength Comments  Lt LE 4/5; Rt hip adduction 4/5 and abduction 4+/5      Flexibility   Soft Tissue Assessment /Muscle Length  yes    Hamstrings  WFL    Quadriceps  Lt quads tight and several knots palpated throughout      Palpation   Palpation comment  quad and patella tendon pain TTP      Special Tests   Other special tests  medial and lateral patella glide - decreased pain with lateral glide - McConnell tape done to assist in more lateral glide and had less pain      Ambulation/Gait   Gait Pattern  --   Lt hip IR during stance gait and steps               Objective measurements completed on examination: See above findings.      [facility] Adult PT Treatment/Exercise - 06/07/19 0001      Self-Care    Self-Care  Other Self-Care Comments    Other Self-Care Comments   Access Code: [email or web address]       Manual Therapy   Manual Therapy  Taping    McConnell  taped for greater lateral glide of patella             PT Education - 06/07/19 1656    Education Details  Access Code: [facility]    Person(s) Educated  Patient    Methods  Explanation;Demonstration;Verbal cues    Comprehension  Verbalized understanding;Returned demonstration       PT Short Term Goals - 06/07/19 1632      PT SHORT TERM GOAL #1   Title  pt will be ind with intial HEP    Time  4    Period  Weeks    Status  New    Target Date  07/04/19      PT SHORT TERM GOAL #2   Title  Pt will report 25% reduction of knee pain    Time  4    Period  Weeks    Status  New    Target Date  07/04/19        PT Long Term Goals - 06/07/19 1517      PT LONG TERM GOAL #1   Title  Pt will be ind with advanced HEP    Time  8    Period  Weeks    Status  New    Target Date  08/01/19      PT LONG TERM GOAL #2   Title  Pt will demonstrate Lt hip abduction and ER strength of 5/5 MMT for improved LE alignment during step ups and standing from low surface or squat position.    Time  8    Period  Weeks    Status  New    Target Date  08/01/19      PT LONG TERM GOAL #3   Title  FOTO < or = to 25% limited    Time  8  Period  Weeks    Status  New    Target Date  08/01/19      PT LONG TERM GOAL #4   Title  Pt will report she is able to sit as needed throughout her day with 75% less pain when standing    Time  8    Period  Weeks    Status  New    Target Date  08/01/19             Plan - 06/07/19 1645    Clinical Impression Statement  Pt is 33 y/o female with Ehlers-danlos syndrome.  She has had surgeries including lateral release on her Rt knee.  Currently she is here for pain occuring in her Lt knee. Pain and TTP present around quad and patellar tendon.  Pt has muscle tension throughout left quads.  pt has 4/5 MMT  throughout Left LE.  Pt demonstrates some medial hip rotation on Lt LE during step ups with Lt and during stance phase of her gait.  Patient has increased medial glide of patella when performing knee extension.  She does have some relief of pain with McConnel tape to decrease medial glide.  Pt will benefit form skilled PT to address impairment and work on improved muscle coordination for decreased knee pain and improved function.    Personal Factors and Comorbidities  Age;Comorbidity 1    Comorbidities  ehlers-danlos    Examination-Activity Limitations  Squat;Sit    Examination-Participation Restrictions  Community Activity    Stability/Clinical Decision Making  Stable/Uncomplicated    Clinical Decision Making  Low    Rehab Potential  Excellent    PT Frequency  2x / week    PT Duration  8 weeks    PT Treatment/Interventions  ADLs/Self Care Home Management;Biofeedback;Cryotherapy;Electrical Stimulation;Iontophoresis 4mg /ml Dexamethasone;Moist Heat;Therapeutic activities;Gait training;Stair training;Therapeutic exercise;Neuromuscular re-education;Manual techniques;Taping;Dry needling;Passive range of motion    PT Next Visit Plan  f/u on taping, hip strength    PT Home Exercise Plan  [email or web address]    Consulted and Agree with Plan of Care  Patient       Patient will benefit from skilled therapeutic intervention in order to improve the following deficits and impairments:  Pain, Hypermobility, Decreased strength, Decreased coordination, Increased muscle spasms  Visit Diagnosis: Acute pain of left knee  Muscle weakness (generalized)     Problem List Patient Active Problem List   Diagnosis Date Noted  . 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 Anxiety 03/13/2018  . Gestational hypertension 02/21/2018  . EHLERS-DANLOS SYNDROME 09/01/2009  . Mild asthma without complication 01/27/2008  . GERD 01/27/2008  . Migraine headache without aura 01/27/2008  . HEMORRHOIDS, EXTERNAL  02/18/2006    Junious SilkJakki L Sarthak Rubenstein, PT 06/07/2019, 5:01 PM  [facility] [address] [place], [place], [address] Phone: [phone]   Fax:  [phone]  Name: Shawna OrleansMeredith B Christian MRN: [id number] Date of Birth: [birth year]

## 2019-06-07 NOTE — Patient Instructions (Signed)
Access Code: [facility]  URL: [email or web address]  Date: 06/07/2019  Prepared by: Jari Favre   Exercises  Hooklying Isometric Clamshell - 10 reps - 3 sets - 1x daily - 7x weekly  Sidelying Hip Extension in Abduction - 10 reps - 3 sets - 1x daily - 7x weekly

## 2019-06-07 NOTE — Addendum Note (Signed)
Addended by: Su Hoff on: 06/07/2019 05:04 PM   Modules accepted: Orders

## 2019-06-13 ENCOUNTER — Ambulatory Visit: Payer: 59 | Admitting: Physical Therapy

## 2019-06-13 ENCOUNTER — Other Ambulatory Visit: Payer: Self-pay

## 2019-06-13 DIAGNOSIS — M6281 Muscle weakness (generalized): Secondary | ICD-10-CM | POA: Diagnosis not present

## 2019-06-13 DIAGNOSIS — M25562 Pain in left knee: Secondary | ICD-10-CM | POA: Diagnosis not present

## 2019-06-13 NOTE — Therapy (Signed)
[facility] [address], [place], [place], [address] Phone: [phone]   Fax:  [phone]  Physical Therapy Treatment  Patient Details  Name: AMNA WELKER MRN: [id number] Date of Birth: May 08, 1986 Referring Provider (PT): Leandrew Koyanagi, MD   Encounter Date: 06/13/2019  PT End of Session - 06/13/19 1313    Visit Number  2    PT Start Time  8676    PT Stop Time  1353    PT Time Calculation (min)  39 min    Activity Tolerance  Patient tolerated treatment well    Behavior During Therapy  [facility] for tasks assessed/performed       Past Medical History:  Diagnosis Date  . Anxiety   . Asthma   . ASTHMA 01/27/2008   Qualifier: Diagnosis of  By: Jerral Ralph    . Depression   . Ehlers-Danlos disease   . Headache   . Hypertension affecting pregnancy 2019  . IBS (irritable bowel syndrome)   . Migraines   . Scoliosis   . Vaginal Pap smear, abnormal     Past Surgical History:  Procedure Laterality Date  . CHOLECYSTECTOMY  2002  . COLPOSCOPY    . COMBINED HYSTEROSCOPY DIAGNOSTIC / D&C  2004  . HYSTEROSCOPY W/D&C  2004  . KNEE ARTHROSCOPY  1999 & 2000  . TONSILLECTOMY     w/ Adenoidectomy    There were no vitals filed for this visit.  Subjective Assessment - 06/13/19 1315    Subjective  Pt states it was starting to feel a little better but huring a little worse today because did not take meloxicam.    Pertinent History  Ehlers-danlos syndrom    Patient Stated Goals  feel stronger and more stable in Lt knee/hip    Currently in Pain?  Yes    Pain Score  6     Pain Location  Knee    Pain Orientation  Left    Pain Descriptors / Indicators  Aching;Sharp    Pain Type  Acute pain    Pain Onset  More than a month ago    Pain Frequency  Intermittent    Multiple Pain Sites  No                       [facility] Adult PT Treatment/Exercise - 06/13/19 0001      Exercises   Exercises   Knee/Hip      Knee/Hip Exercises: Stretches   Quad Stretch  Left;2 reps;30 seconds    Piriformis Stretch  Left;3 reps;30 seconds      Knee/Hip Exercises: Machines for Strengthening   Total Gym Leg Press  seat 6 bilat 70# x 20, Lt LE 30# x 20      Knee/Hip Exercises: Supine   Straight Leg Raises  Strengthening;Left;15 reps   toe out 45 deg     Knee/Hip Exercises: Sidelying   Hip ABduction  Strengthening;Left;15 reps    Hip ADduction  Strengthening;Left;15 reps    Clams  20x      Knee/Hip Exercises: Prone   Straight Leg Raises  Strengthening;Left;20 reps      Manual Therapy   Manual Therapy  Soft tissue mobilization    Soft tissue mobilization  Left quad and patella tendon, patella lateral glides               PT Short Term Goals - 06/13/19 1320      PT SHORT TERM  GOAL #1   Title  pt will be ind with intial HEP    Status  Achieved        PT Long Term Goals - 06/07/19 1517      PT LONG TERM GOAL #1   Title  Pt will be ind with advanced HEP    Time  8    Period  Weeks    Status  New    Target Date  08/01/19      PT LONG TERM GOAL #2   Title  Pt will demonstrate Lt hip abduction and ER strength of 5/5 MMT for improved LE alignment during step ups and standing from low surface or squat position.    Time  8    Period  Weeks    Status  New    Target Date  08/01/19      PT LONG TERM GOAL #3   Title  FOTO < or = to 25% limited    Time  8    Period  Weeks    Status  New    Target Date  08/01/19      PT LONG TERM GOAL #4   Title  Pt will report she is able to sit as needed throughout her day with 75% less pain when standing    Time  8    Period  Weeks    Status  New    Target Date  08/01/19            Plan - 06/13/19 1320    Clinical Impression Statement  Pt felt some improvement since intial treatment and has been ind with her initial HEP.  Pt has improvement with taping but it got irritated so she took it off.  Pt was able to progress hip  strengthening exercises and will benefit from skilled PT to continue to work on hip and core strength.    PT Treatment/Interventions  ADLs/Self Care Home Management;Biofeedback;Cryotherapy;Electrical Stimulation;Iontophoresis 4mg /ml Dexamethasone;Moist Heat;Therapeutic activities;Gait training;Stair training;Therapeutic exercise;Neuromuscular re-education;Manual techniques;Taping;Dry needling;Passive range of motion    PT Next Visit Plan  hip, core and LE strength    PT Home Exercise Plan  Access Code: [facility]    Consulted and Agree with Plan of Care  Patient       Patient will benefit from skilled therapeutic intervention in order to improve the following deficits and impairments:  Pain, Hypermobility, Decreased strength, Decreased coordination, Increased muscle spasms  Visit Diagnosis: Acute pain of left knee  Muscle weakness (generalized)     Problem List Patient Active Problem List   Diagnosis Date Noted  . 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 Anxiety 03/13/2018  . Gestational hypertension 02/21/2018  . EHLERS-DANLOS SYNDROME 09/01/2009  . Mild asthma without complication 01/27/2008  . GERD 01/27/2008  . Migraine headache without aura 01/27/2008  . HEMORRHOIDS, EXTERNAL 02/18/2006    02/20/2006, PT 06/13/2019, 1:55 PM  [facility] [address] [place], [place], [place] Phone: [phone]   Fax:  [phone]  Name: PARASKEVI FUNEZ MRN: [name] Date of Birth: [birth year]

## 2019-06-14 ENCOUNTER — Other Ambulatory Visit: Payer: Self-pay | Admitting: Family Medicine

## 2019-06-14 MED ORDER — AMPHETAMINE-DEXTROAMPHET ER 30 MG PO CP24: 30.0000 mg | ORAL_CAPSULE | Freq: Every day | ORAL | 0 refills | Status: DC

## 2019-06-14 MED FILL — ADDERALL XR 30 MG CAP SA: 30 | 30 days supply | Qty: 30 | Fill #0

## 2019-06-18 MED FILL — LO LOESTRIN FE 1-10 TABLET: 1 MG-10 MCG | 28 days supply | Qty: 28 | Fill #1

## 2019-06-18 MED FILL — SUMATRIPTAN SUCC 100 MG TAB: 100 | 30 days supply | Qty: 9 | Fill #1

## 2019-06-19 ENCOUNTER — Encounter: Payer: 59 | Admitting: Physical Therapy

## 2019-06-25 ENCOUNTER — Telehealth: Payer: Self-pay | Admitting: Family Medicine

## 2019-06-25 NOTE — Telephone Encounter (Signed)
Blair Heys with [employer] is calling for a prior authorization Erenumab-aooe (Elfrida) 12 MG/ML SOAJ [[id number]]   Cb- [phone]   [facility]

## 2019-06-25 NOTE — Telephone Encounter (Signed)
Spoke to [name] at [facility].  PA has already been completed and approved for patient for her Aimovig via [employer].  PA is good until 11/12/2019.  Alison Nichols verbalized understanding.

## 2019-06-25 NOTE — Telephone Encounter (Signed)
See note

## 2019-06-26 ENCOUNTER — Other Ambulatory Visit: Payer: Self-pay

## 2019-06-26 ENCOUNTER — Ambulatory Visit: Payer: 59 | Attending: Orthopaedic Surgery | Admitting: Physical Therapy

## 2019-06-26 ENCOUNTER — Encounter: Payer: Self-pay | Admitting: Physical Therapy

## 2019-06-26 DIAGNOSIS — M25562 Pain in left knee: Secondary | ICD-10-CM | POA: Diagnosis not present

## 2019-06-26 DIAGNOSIS — M6281 Muscle weakness (generalized): Secondary | ICD-10-CM | POA: Diagnosis not present

## 2019-06-26 NOTE — Patient Instructions (Signed)

## 2019-06-26 NOTE — Therapy (Signed)
[facility] [address], [place], [place], [address] Phone: [phone]   Fax:  [phone]  Physical Therapy Treatment  Patient Details  Name: Alison Nichols MRN: [id number] Date of Birth: 1985-09-09 Referring Provider (PT): Leandrew Koyanagi, MD   Encounter Date: 06/26/2019  PT End of Session - 06/26/19 1439    Visit Number  3    Date for PT Re-Evaluation  08/01/19    PT Start Time  3810    PT Stop Time  1520    PT Time Calculation (min)  41 min    Activity Tolerance  Patient tolerated treatment well    Behavior During Therapy  [facility] for tasks assessed/performed       Past Medical History:  Diagnosis Date  . Anxiety   . Asthma   . ASTHMA 01/27/2008   Qualifier: Diagnosis of  By: Jerral Ralph    . Depression   . Ehlers-Danlos disease   . Headache   . Hypertension affecting pregnancy 2019  . IBS (irritable bowel syndrome)   . Migraines   . Scoliosis   . Vaginal Pap smear, abnormal     Past Surgical History:  Procedure Laterality Date  . CHOLECYSTECTOMY  2002  . COLPOSCOPY    . COMBINED HYSTEROSCOPY DIAGNOSTIC / D&C  2004  . HYSTEROSCOPY W/D&C  2004  . KNEE ARTHROSCOPY  1999 & 2000  . TONSILLECTOMY     w/ Adenoidectomy    There were no vitals filed for this visit.  Subjective Assessment - 06/26/19 1444    Subjective  My knee has been a little more sore lately but not sure if it is the weather change.    Patient Stated Goals  feel stronger and more stable in Lt knee/hip    Currently in Pain?  Yes    Pain Score  1    7/10 when doing movements that aggravate   Pain Location  Knee    Pain Orientation  Left    Pain Descriptors / Indicators  Sore    Pain Radiating Towards  superior patella feels sore    Pain Frequency  Intermittent    Multiple Pain Sites  No                       [facility] Adult PT Treatment/Exercise - 06/26/19 0001      Knee/Hip Exercises: Stretches   Engineer, civil (consulting)  Left;2 reps;30 seconds      Knee/Hip Exercises: Machines for Strengthening   Total Gym Leg Press  seat 6 bilat 70# x 20      Knee/Hip Exercises: Standing   Lateral Step Up  Left;20 reps;Hand Hold: 0;Step Height: 4"    Forward Step Up  Left;20 reps;Hand Hold: 0;Step Height: 4"    SLS with Vectors  sliders fwd and side - 10x each      Knee/Hip Exercises: Seated   Long Arc Quad  Strengthening;Left;10 reps;Weights   2# with ball squeeze   Sit to Sand  20 reps;without UE support   5 reps with ball squeeze, 5 reps with red band - both hard     Knee/Hip Exercises: Supine   Straight Leg Raises  Strengthening;Left;15 reps   2#     Knee/Hip Exercises: Sidelying   Hip ABduction  Strengthening;Left;20 reps   2#   Hip ADduction  Strengthening;Left;20 reps   2#     Knee/Hip Exercises: Prone   Hamstring Curl  20 reps  2#   Hip Extension  Strengthening;Left;20 reps   2#     Iontophoresis   Type of Iontophoresis  Dexamethasone    Location  Lt knee    Dose  1.0 mL    Time  4-6 hour release   #1              PT Short Term Goals - 06/26/19 1449      PT SHORT TERM GOAL #2   Title  Pt will report 25% reduction of knee pain    Baseline  same pain    Status  On-going        PT Long Term Goals - 06/07/19 1517      PT LONG TERM GOAL #1   Title  Pt will be ind with advanced HEP    Time  8    Period  Weeks    Status  New    Target Date  08/01/19      PT LONG TERM GOAL #2   Title  Pt will demonstrate Lt hip abduction and ER strength of 5/5 MMT for improved LE alignment during step ups and standing from low surface or squat position.    Time  8    Period  Weeks    Status  New    Target Date  08/01/19      PT LONG TERM GOAL #3   Title  FOTO < or = to 25% limited    Time  8    Period  Weeks    Status  New    Target Date  08/01/19      PT LONG TERM GOAL #4   Title  Pt will report she is able to sit as needed throughout her day with 75% less pain when  standing    Time  8    Period  Weeks    Status  New    Target Date  08/01/19            Plan - 06/26/19 1528    Clinical Impression Statement  Pt was able to add resistance.  She had the most increased soreness with sit to stand going up and down from lower surface. PT initiated ionto treatments today.  She was given education handout for 4-6 hour release patch.  Pt will benefit from skilled PT to continue to work on quad strength in functional positions.    PT Treatment/Interventions  ADLs/Self Care Home Management;Biofeedback;Cryotherapy;Electrical Stimulation;Iontophoresis 4mg /ml Dexamethasone;Moist Heat;Therapeutic activities;Gait training;Stair training;Therapeutic exercise;Neuromuscular re-education;Manual techniques;Taping;Dry needling;Passive range of motion    PT Next Visit Plan  f/u on ionto #1, quad, hip, core and LE strength - try to add small ROM in quarter squat position for strengthening quad in lower position    PT Home Exercise Plan  [email or web address]    Consulted and Agree with Plan of Care  Patient       Patient will benefit from skilled therapeutic intervention in order to improve the following deficits and impairments:  Pain, Hypermobility, Decreased strength, Decreased coordination, Increased muscle spasms  Visit Diagnosis: Acute pain of left knee  Muscle weakness (generalized)     Problem List Patient Active Problem List   Diagnosis Date Noted  . 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 Anxiety 03/13/2018  . Gestational hypertension 02/21/2018  . EHLERS-DANLOS SYNDROME 09/01/2009  . Mild asthma without complication 01/27/2008  . GERD 01/27/2008  . Migraine headache without aura 01/27/2008  . HEMORRHOIDS, EXTERNAL 02/18/2006  Brayton Caves Evamae Rowen, PT 06/26/2019, 3:35 PM  [facility] [address], [address] [place], [place], [address] Phone: [phone]   Fax:  [phone]  Name:  Alison Nichols MRN: [id number] Date of Birth: Aug 07, 1986

## 2019-07-05 ENCOUNTER — Ambulatory Visit: Payer: 59 | Admitting: Physical Therapy

## 2019-07-06 MED FILL — AIMOVIG 70 MG/ML SOAJ: 70 | 30 days supply | Qty: 1 | Fill #1

## 2019-07-06 MED FILL — MELOXICAM 7.5 MG TABLET: 7.5 | 15 days supply | Qty: 30 | Fill #1

## 2019-07-11 ENCOUNTER — Other Ambulatory Visit: Payer: Self-pay

## 2019-07-11 ENCOUNTER — Encounter: Payer: Self-pay | Admitting: Physical Therapy

## 2019-07-11 ENCOUNTER — Ambulatory Visit: Payer: 59 | Admitting: Physical Therapy

## 2019-07-11 DIAGNOSIS — M6281 Muscle weakness (generalized): Secondary | ICD-10-CM | POA: Diagnosis not present

## 2019-07-11 DIAGNOSIS — M25562 Pain in left knee: Secondary | ICD-10-CM | POA: Diagnosis not present

## 2019-07-11 NOTE — Patient Instructions (Addendum)
Access Code: [facility]  URL: [email or web address]  Date: 07/11/2019  Prepared by: Jari Favre   Exercises  Sidelying Hip Extension in Abduction - 10 reps - 3 sets - 1x daily - 7x weekly  Active Straight Leg Raise with Quad Set - 10 reps - 3 sets - 1x daily - 7x weekly  Squat with Chair Touch and Resistance Loop - 10 reps - 3 sets - 1x daily - 7x weekly  Clamshell - 10 reps - 3 sets - 1x daily - 7x weekly

## 2019-07-11 NOTE — Therapy (Signed)
[facility] [address] [place], [place], [address] Phone: [phone]   Fax:  [phone]  Physical Therapy Treatment  Patient Details  Name: Alison Nichols MRN: [id number] Date of Birth: 1985-10-20 Referring Provider (PT): Alison Koyanagi, MD   Encounter Date: 07/11/2019  PT End of Session - 07/11/19 1400    Visit Number  4    Date for PT Re-Evaluation  08/01/19    PT Start Time  1400    PT Stop Time  1440    PT Time Calculation (min)  40 min    Activity Tolerance  Patient tolerated treatment well    Behavior During Therapy  [facility] for tasks assessed/performed       Past Medical History:  Diagnosis Date  . Anxiety   . Asthma   . ASTHMA 01/27/2008   Qualifier: Diagnosis of  By: Jerral Ralph    . Depression   . Ehlers-Danlos disease   . Headache   . Hypertension affecting pregnancy 2019  . IBS (irritable bowel syndrome)   . Migraines   . Scoliosis   . Vaginal Pap smear, abnormal     Past Surgical History:  Procedure Laterality Date  . CHOLECYSTECTOMY  2002  . COLPOSCOPY    . COMBINED HYSTEROSCOPY DIAGNOSTIC / D&C  2004  . HYSTEROSCOPY W/D&C  2004  . KNEE ARTHROSCOPY  1999 & 2000  . TONSILLECTOMY     w/ Adenoidectomy    There were no vitals filed for this visit.  Subjective Assessment - 07/11/19 1414    Subjective  Pt states there has been more pain the past 2 days possibly due to cold.  States overall it feels better.    Pertinent History  Ehlers-danlos syndrom    Patient Stated Goals  feel stronger and more stable in Lt knee/hip    Currently in Pain?  No/denies                       [facility] Adult PT Treatment/Exercise - 07/11/19 0001      Knee/Hip Exercises: Aerobic   Nustep  L3 x 6 min  - PT present to get status update      Knee/Hip Exercises: Standing   Forward Lunges  Left;15 reps   BOSU   Side Lunges  Left;15 reps   BOSU   Functional Squat Limitations  cues  to engage core, keep weight back in heels, hinge at hips    SLS with Vectors  sliders fwd and side - 10x each      Knee/Hip Exercises: Sidelying   Clams  20x   red band     Manual Therapy   Manual Therapy  Myofascial release    Myofascial Release  quad and IT band             PT Education - 07/11/19 1443    Education Details  Access Code: [facility]    Person(s) Educated  Patient    Methods  Explanation;Demonstration;Verbal cues;Handout    Comprehension  Verbalized understanding;Returned demonstration       PT Short Term Goals - 06/26/19 1449      PT SHORT TERM GOAL #2   Title  Pt will report 25% reduction of knee pain    Baseline  same pain    Status  On-going        PT Long Term Goals - 06/07/19 1517      PT LONG TERM GOAL #1  Title  Pt will be ind with advanced HEP    Time  8    Period  Weeks    Status  New    Target Date  08/01/19      PT LONG TERM GOAL #2   Title  Pt will demonstrate Lt hip abduction and ER strength of 5/5 MMT for improved LE alignment during step ups and standing from low surface or squat position.    Time  8    Period  Weeks    Status  New    Target Date  08/01/19      PT LONG TERM GOAL #3   Title  FOTO < or = to 25% limited    Time  8    Period  Weeks    Status  New    Target Date  08/01/19      PT LONG TERM GOAL #4   Title  Pt will report she is able to sit as needed throughout her day with 75% less pain when standing    Time  8    Period  Weeks    Status  New    Target Date  08/01/19            Plan - 07/11/19 1405    Clinical Impression Statement  Needed some cues for knee alignment during exercises and when she had improved alignment, she had less pain.  Pt needs cues to keep core engaged.  Has improved hip stability when activating the core.  Pt was able to add to HEP today.  Unsure of whether ionto helped so PT will re-assess next visit whether to add it back into treatment or not.  Pt will benefit from skilled  PT to workon hip and core strength    PT Treatment/Interventions  ADLs/Self Care Home Management;Biofeedback;Cryotherapy;Electrical Stimulation;Iontophoresis 4mg /ml Dexamethasone;Moist Heat;Therapeutic activities;Gait training;Stair training;Therapeutic exercise;Neuromuscular re-education;Manual techniques;Taping;Dry needling;Passive range of motion    PT Next Visit Plan  f/u on ionto if patient noticed a difference, quad, hip, core and LE strength - f/u on small squat with band, add quadruped if tolerated    PT Home Exercise Plan  Access Code: [facility]    Recommended Other Services  cert signed    Consulted and Agree with Plan of Care  Patient       Patient will benefit from skilled therapeutic intervention in order to improve the following deficits and impairments:  Pain, Hypermobility, Decreased strength, Decreased coordination, Increased muscle spasms  Visit Diagnosis: Acute pain of left knee  Muscle weakness (generalized)     Problem List Patient Active Problem List   Diagnosis Date Noted  . 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 Anxiety 03/13/2018  . Gestational hypertension 02/21/2018  . EHLERS-DANLOS SYNDROME 09/01/2009  . Mild asthma without complication 01/27/2008  . GERD 01/27/2008  . Migraine headache without aura 01/27/2008  . HEMORRHOIDS, EXTERNAL 02/18/2006    02/20/2006, PT 07/11/2019, 2:49 PM  [facility] [address] [place], [place], [place] Phone: [phone]   Fax:  [phone]  Name: Alison Nichols MRN: [name] Date of Birth: 12/23/1985

## 2019-07-18 ENCOUNTER — Ambulatory Visit (INDEPENDENT_AMBULATORY_CARE_PROVIDER_SITE_OTHER)
Admission: RE | Admit: 2019-07-18 | Discharge: 2019-07-18 | Disposition: A | Discharge status: Home / Self Care | Payer: 59 | Source: Ambulatory Visit

## 2019-07-18 DIAGNOSIS — J029 Acute pharyngitis, unspecified: Secondary | ICD-10-CM

## 2019-07-18 MED ORDER — PREDNISONE 20 MG PO TABS: 20.0000 mg | ORAL_TABLET | Freq: Two times a day (BID) | ORAL | 0 refills | Status: AC

## 2019-07-18 MED ORDER — AMOXICILLIN 500 MG PO CAPS: 500.0000 mg | ORAL_CAPSULE | Freq: Two times a day (BID) | ORAL | 0 refills | Status: AC

## 2019-07-18 MED FILL — predniSONE 20 MG TABS: 20 | 5 days supply | Qty: 10 | Fill #0

## 2019-07-18 MED FILL — AMOXICILLIN 500 MG CAPSULE: 500 | 10 days supply | Qty: 20 | Fill #0

## 2019-07-18 NOTE — Discharge Instructions (Signed)
Based on symptoms concerned for strep throat.  Patient unable to obtain childcare to go in person and be evaluated at this time.   Push fluids and get rest Prescribed amoxicillin 500mg  twice daily for 10 days.  Take as directed and to completion.  Prednisone prescribed for swelling.   Drink warm or cool liquids, use throat lozenges, or popsicles to help alleviate symptoms Take OTC ibuprofen or tylenol as needed for pain Follow up with PCP if symptoms persist Follow up in person or go to ER if you have any new or worsening symptoms such as fever, chills, nausea, vomiting, worsening sore throat, cough, abdominal pain, chest pain, changes in bowel or bladder habits, etc...  Cannot rule out COVID at this time.  Declines testing.  Please stay quarantined at home for at least 10 days from when symptoms started.

## 2019-07-18 NOTE — ED Provider Notes (Signed)
[facility]  Virtual Visit via Video Note:  Alison Nichols  initiated request for Telemedicine visit with [facility] team. I connected with Alison Nichols  on 07/18/2019 at 2:51 PM  for a synchronized telemedicine visit using a telephone enabled HIPPA compliant telemedicine application. I verified that I am speaking with Alison Nichols  using two identifiers. Rennis Harding, PA-C  was physically located in a [facility] Urgent care site and Alison Nichols was located at a different location.   The limitations of evaluation and management by telemedicine as well as the availability of in-person appointments were discussed. Patient was informed that she  may incur a bill ( including co-pay) for this virtual visit encounter. Alison Nichols  expressed understanding and gave verbal consent to proceed with virtual visit.   [id number] 07/18/19 Arrival Time: 1444  CC: SORE THROAT  SUBJECTIVE: History from: patient.  Alison Nichols is a 33 y.o. female who presents with abrupt onset of sore throat for 1 week, worse over past couple of day.  Denies sick exposure to COVID, flu or strep.  Denies recent travel.  Has tried ibuprofen and salt-water gargles without relief.  Symptoms are made worse with swallowing, but tolerating liquids and own secretions without difficulty.  Reports previous symptoms in the past related to strep.  Denies fever, chills, fatigue, ear pain, sinus pain, rhinorrhea, nasal congestion, cough, SOB, wheezing, chest pain, nausea, vomiting, changes in bowel or bladder habits.    Does report COVID infection earlier this year.  States this does not feel similar.    ROS: As per HPI.  All other pertinent ROS negative.     Past Medical History:  Diagnosis Date  . Anxiety   . Asthma   . ASTHMA 01/27/2008   Qualifier: Diagnosis of  By: Thereasa Solo    . Depression   . Ehlers-Danlos disease   . Headache   . Hypertension affecting  pregnancy 2019  . IBS (irritable bowel syndrome)   . Migraines   . Scoliosis   . Vaginal Pap smear, abnormal    Past Surgical History:  Procedure Laterality Date  . CHOLECYSTECTOMY  2002  . COLPOSCOPY    . COMBINED HYSTEROSCOPY DIAGNOSTIC / D&C  2004  . HYSTEROSCOPY W/D&C  2004  . KNEE ARTHROSCOPY  1999 & 2000  . TONSILLECTOMY     w/ Adenoidectomy   Allergies  Allergen Reactions  . Cephalexin Hives   No current facility-administered medications on file prior to encounter.    Current Outpatient Medications on File Prior to Encounter  Medication Sig Dispense Refill  . albuterol (VENTOLIN HFA) 108 (90 Base) MCG/ACT inhaler Inhale 1-2 puffs into the lungs every 6 (six) hours as needed for wheezing or shortness of breath. 18 g 2  . amphetamine-dextroamphetamine (ADDERALL XR) 30 MG 24 hr capsule Take 1 capsule (30 mg total) by mouth daily. 30 capsule 0  . amphetamine-dextroamphetamine (ADDERALL) 5 MG tablet Take 1 tablet (5 mg total) by mouth daily. 30 tablet 0  . baclofen (LIORESAL) 10 MG tablet Take 1 tablet (10 mg total) by mouth 3 (three) times daily. 90 tablet 1  . Erenumab-aooe (AIMOVIG) 70 MG/ML SOAJ Inject 70 mg into the skin every 30 (thirty) days. 1 mL 11  . hydrOXYzine (ATARAX/VISTARIL) 25 MG tablet Take 1 tablet (25 mg total) by mouth at bedtime as needed for itching. 90 tablet 1  . meloxicam (MOBIC) 7.5 MG tablet Take 1 tablet (7.5 mg total)  by mouth 2 (two) times daily as needed for pain. 30 tablet 2  . sertraline (ZOLOFT) 100 MG tablet Take 1 tablet (100 mg total) by mouth daily. 90 tablet 1  . SUMAtriptan (IMITREX) 100 MG tablet Take 100mg  by mouth at onset of headache. May repeat in 2 hours if headache persists or recurs. No more than 2/24 hours. 10 tablet 3    OBJECTIVE:  PE done over telephone  There were no vitals filed for this visit.  General: alert; sounds fatigued, but nontoxic HENT: no slurred speech; tolerating own secretions Lungs: normal respiratory  effort; speaking in full sentences without difficulty Neurologic: No slurred speech Psychological: alert and cooperative; normal mood and affect  ASSESSMENT & PLAN:  1. Sore throat   2. Acute pharyngitis, unspecified etiology     Meds ordered this encounter  Medications  . amoxicillin (AMOXIL) 500 MG capsule    Sig: Take 1 capsule (500 mg total) by mouth 2 (two) times daily for 10 days.    Dispense:  20 capsule    Refill:  0    Order Specific Question:   Supervising Provider    Answer:   Raylene Everts [[id number]]  . predniSONE (DELTASONE) 20 MG tablet    Sig: Take 1 tablet (20 mg total) by mouth 2 (two) times daily with a meal for 5 days.    Dispense:  10 tablet    Refill:  0    Order Specific Question:   Supervising Provider    Answer:   Raylene Everts [[id number]]    Based on symptoms concerned for strep throat.  Patient unable to obtain childcare to go in person and be evaluated at this time.   Push fluids and get rest Prescribed amoxicillin 500mg  twice daily for 10 days.  Take as directed and to completion.  Prednisone prescribed for swelling.   Drink warm or cool liquids, use throat lozenges, or popsicles to help alleviate symptoms Take OTC ibuprofen or tylenol as needed for pain Follow up with PCP if symptoms persist Follow up in person or go to ER if you have any new or worsening symptoms such as fever, chills, nausea, vomiting, worsening sore throat, cough, abdominal pain, chest pain, changes in bowel or bladder habits, etc...  Cannot rule out COVID at this time.  Declines testing.  Please stay quarantined at home for at least 10 days from when symptoms started.    I discussed the assessment and treatment plan with the patient. The patient was provided an opportunity to ask questions and all were answered. The patient agreed with the plan and demonstrated an understanding of the instructions.   The patient was advised to call back or seek an in-person evaluation if  the symptoms worsen or if the condition fails to improve as anticipated.  I provided 5 minutes of non-face-to-face time during this encounter.  [place], PA-C  07/18/2019 2:51 PM         Lestine Box, PA-C 07/18/19 1452

## 2019-07-24 ENCOUNTER — Other Ambulatory Visit: Payer: Self-pay

## 2019-07-24 ENCOUNTER — Encounter: Payer: Self-pay | Admitting: Physical Therapy

## 2019-07-24 ENCOUNTER — Ambulatory Visit: Payer: 59 | Attending: Orthopaedic Surgery | Admitting: Physical Therapy

## 2019-07-24 DIAGNOSIS — M25562 Pain in left knee: Secondary | ICD-10-CM | POA: Diagnosis not present

## 2019-07-24 DIAGNOSIS — M6281 Muscle weakness (generalized): Secondary | ICD-10-CM | POA: Diagnosis not present

## 2019-07-24 NOTE — Therapy (Addendum)
[facility] [address] [place], [place], [address] Phone: [phone]   Fax:  [phone]  Physical Therapy Treatment  Patient Details  Name: Alison Nichols MRN: [id number] Date of Birth: [birth year] Referring Provider (PT): Leandrew Koyanagi, MD   Encounter Date: 07/24/2019  PT End of Session - 07/24/19 1531    Visit Number  5    Date for PT Re-Evaluation  08/01/19    PT Start Time  8315    PT Stop Time  1526    PT Time Calculation (min)  41 min    Activity Tolerance  Patient tolerated treatment well    Behavior During Therapy  [facility] for tasks assessed/performed       Past Medical History:  Diagnosis Date  . Anxiety   . Asthma   . ASTHMA 01/27/2008   Qualifier: Diagnosis of  By: Jerral Ralph    . Depression   . Ehlers-Danlos disease   . Headache   . Hypertension affecting pregnancy 2019  . IBS (irritable bowel syndrome)   . Migraines   . Scoliosis   . Vaginal Pap smear, abnormal     Past Surgical History:  Procedure Laterality Date  . CHOLECYSTECTOMY  2002  . COLPOSCOPY    . COMBINED HYSTEROSCOPY DIAGNOSTIC / D&C  2004  . HYSTEROSCOPY W/D&C  2004  . KNEE ARTHROSCOPY  1999 & 2000  . TONSILLECTOMY     w/ Adenoidectomy    There were no vitals filed for this visit.  Subjective Assessment - 07/24/19 1447    Subjective  Overall, it feels 50% better.  I have to get up or straighten it out every hour, but I can sleep better.  Pt states she took prednisone a couple of days and wasn't using the knee like she normally did and it didn't hurt on those days.    Pertinent History  Ehlers-danlos syndrom    Patient Stated Goals  feel stronger and more stable in Lt knee/hip    Currently in Pain?  No/denies                       [facility] Adult PT Treatment/Exercise - 07/24/19 0001      Knee/Hip Exercises: Aerobic   Nustep  L3 x 8 min  - PT present to get status update      Knee/Hip  Exercises: Standing   Functional Squat Limitations  cues to engage core, keep weight back in heels, hinge at hips   did with wall behind for cueing more backwards movement   Other Standing Knee Exercises  side step and hip extension red band around knees       Knee/Hip Exercises: Seated   Long Arc Quad  Strengthening;Left;10 reps;Weights   2# with ball squeeze   Clamshell with TheraBand  Red   20x single leg     Knee/Hip Exercises: Supine   Bridges  Strengthening;Right;Left;20 reps    Bridges Limitations  red band around thighs      Manual Therapy   Manual Therapy  Taping    Kinesiotex  IT sales professional  Y-tape to patella      squat against the wall - red band on thighs - 20x       PT Education - 07/24/19 1525    Education Details  Access Code: [email or web address]) Educated  Patient    Methods  Explanation;Demonstration;Verbal  cues;Handout;Tactile cues    Comprehension  Verbalized understanding;Returned demonstration       PT Short Term Goals - 07/24/19 1452      PT SHORT TERM GOAL #1   Title  pt will be ind with intial HEP    Status  Achieved      PT SHORT TERM GOAL #2   Title  Pt will report 25% reduction of knee pain    Baseline  50% less pain    Status  Achieved        PT Long Term Goals - 06/07/19 1517      PT LONG TERM GOAL #1   Title  Pt will be ind with advanced HEP    Time  8    Period  Weeks    Status  New    Target Date  08/01/19      PT LONG TERM GOAL #2   Title  Pt will demonstrate Lt hip abduction and ER strength of 5/5 MMT for improved LE alignment during step ups and standing from low surface or squat position.    Time  8    Period  Weeks    Status  New    Target Date  08/01/19      PT LONG TERM GOAL #3   Title  FOTO < or = to 25% limited    Time  8    Period  Weeks    Status  New    Target Date  08/01/19      PT LONG TERM GOAL #4   Title  Pt will report she is able to sit as needed throughout her  day with 75% less pain when standing    Time  8    Period  Weeks    Status  New    Target Date  08/01/19            Plan - 07/24/19 1555    Clinical Impression Statement  Pt reports she has 50% less pain overall . Pt is slowly making progress with strengthening exercises.  Pt conintues to have pain with knee flexion for greater than an hour.  Pt needed cues throughout session for form and technique with exercises.  Pt continues to have hip and quad weakness Lt LE demonstrated by difficulty keeping knee aligned when knee in slight flexion during standing and squatting exercises.  Pt will continue to benefit from skilled PT to work on strengthening.    PT Treatment/Interventions  ADLs/Self Care Home Management;Biofeedback;Cryotherapy;Electrical Stimulation;Iontophoresis 83m/ml Dexamethasone;Moist Heat;Therapeutic activities;Gait training;Stair training;Therapeutic exercise;Neuromuscular re-education;Manual techniques;Taping;Dry needling;Passive range of motion    PT Next Visit Plan  f/u on squats against the wall, re-assess goals, possible refer to aquatic or continue to work on strengthening on land?    PT Home Exercise Plan  [facility]   Consulted and Agree with Plan of Care  Patient       Patient will benefit from skilled therapeutic intervention in order to improve the following deficits and impairments:  Pain, Hypermobility, Decreased strength, Decreased coordination, Increased muscle spasms  Visit Diagnosis: Acute pain of left knee  Muscle weakness (generalized)     Problem List Patient Active Problem List   Diagnosis Date Noted  . 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 Anxiety 03/13/2018  . Gestational hypertension 02/21/2018  . EHLERS-DANLOS SYNDROME 09/01/2009  . Mild asthma without complication 016/05/9603 . GERD 01/27/2008  . Migraine headache without aura 01/27/2008  . HEMORRHOIDS, EXTERNAL 02/18/2006  Jule Ser, PT 07/24/2019,  4:02 PM  [facility] [address], [place], [place], [address] Phone: [phone]   Fax:  [phone]  Name: Alison Nichols MRN: [id number] Date of Birth: 01/14/1986  PHYSICAL THERAPY DISCHARGE SUMMARY  Visits from Start of Care: 5  Current functional level related to goals / functional outcomes: See above goals   Remaining deficits: See above   Education / Equipment: HEP  Plan: Patient agrees to discharge.  Patient goals were not met. Patient is being discharged due to not returning since the last visit.  ?????    [employer], PT 08/21/19 9:54 AM

## 2019-07-24 NOTE — Patient Instructions (Signed)
Access Code: [facility]  URL: [email or web address]  Date: 07/24/2019  Prepared by: Jari Favre   Exercises  Sidelying Hip Extension in Abduction - 10 reps - 3 sets - 1x daily - 7x weekly  Active Straight Leg Raise with Quad Set - 10 reps - 3 sets - 1x daily - 7x weekly  Squat with Chair Touch and Resistance Loop - 10 reps - 3 sets - 1x daily - 7x weekly  Clamshell - 10 reps - 3 sets - 1x daily - 7x weekly  Side Stepping with Resistance at Thighs - 10 reps - 3 sets - 1x daily - 7x weekly  Reverse Band Walks - 10 reps - 3 sets - 1x daily - 7x weekly

## 2019-07-26 ENCOUNTER — Other Ambulatory Visit: Payer: Self-pay

## 2019-07-27 ENCOUNTER — Encounter: Payer: Self-pay | Admitting: Family Medicine

## 2019-07-27 ENCOUNTER — Ambulatory Visit: Payer: 59 | Admitting: Family Medicine

## 2019-07-27 VITALS — BP 104/80 | HR 89 | Temp 98.0°F | Ht 67.0 in | Wt 154.0 lb

## 2019-07-27 DIAGNOSIS — U071 COVID-19: Secondary | ICD-10-CM

## 2019-07-27 DIAGNOSIS — R5383 Other fatigue: Secondary | ICD-10-CM

## 2019-07-27 LAB — CBC WITH DIFFERENTIAL/PLATELET
Basophils Absolute: 0.1 10*3/uL (ref 0.0–0.1)
Basophils Relative: 0.7 % (ref 0.0–3.0)
Eosinophils Absolute: 0 10*3/uL (ref 0.0–0.7)
Eosinophils Relative: 0.3 % (ref 0.0–5.0)
HCT: 47.1 % — ABNORMAL HIGH (ref 36.0–46.0)
Hemoglobin: 15.9 g/dL — ABNORMAL HIGH (ref 12.0–15.0)
Lymphocytes Relative: 30.5 % (ref 12.0–46.0)
Lymphs Abs: 3.3 10*3/uL (ref 0.7–4.0)
MCHC: 33.8 g/dL (ref 30.0–36.0)
MCV: 91.3 fl (ref 78.0–100.0)
Monocytes Absolute: 0.6 10*3/uL (ref 0.1–1.0)
Monocytes Relative: 5.6 % (ref 3.0–12.0)
Neutro Abs: 6.7 10*3/uL (ref 1.4–7.7)
Neutrophils Relative %: 62.9 % (ref 43.0–77.0)
Platelets: 317 10*3/uL (ref 150.0–400.0)
RBC: 5.16 Mil/uL — ABNORMAL HIGH (ref 3.87–5.11)
RDW: 13.1 % (ref 11.5–15.5)
WBC: 10.7 10*3/uL — ABNORMAL HIGH (ref 4.0–10.5)

## 2019-07-27 LAB — COMPREHENSIVE METABOLIC PANEL
ALT: 12 U/L (ref 0–35)
AST: 12 U/L (ref 0–37)
Albumin: 4.6 g/dL (ref 3.5–5.2)
Alkaline Phosphatase: 90 U/L (ref 39–117)
BUN: 13 mg/dL (ref 6–23)
CO2: 25 mEq/L (ref 19–32)
Calcium: 9.3 mg/dL (ref 8.4–10.5)
Chloride: 99 mEq/L (ref 96–112)
Creatinine, Ser: 0.77 mg/dL (ref 0.40–1.20)
GFR: 85.21 mL/min (ref >60.00)
Glucose, Bld: 97 mg/dL (ref 70–99)
Potassium: 4 mEq/L (ref 3.5–5.1)
Sodium: 133 mEq/L — ABNORMAL LOW (ref 135–145)
Total Bilirubin: 0.5 mg/dL (ref 0.2–1.2)
Total Protein: 7.7 g/dL (ref 6.0–8.3)

## 2019-07-27 LAB — VITAMIN D 25 HYDROXY (VIT D DEFICIENCY, FRACTURES): VITD: 14.67 ng/mL — ABNORMAL LOW (ref 30.00–100.00)

## 2019-07-27 LAB — TSH: TSH: 0.51 u[IU]/mL (ref 0.35–4.50)

## 2019-07-27 NOTE — Progress Notes (Signed)
Patient: Alison Nichols MRN: [id number] DOB: [birth year] PCP: Orma Flaming, MD     Subjective:  Chief Complaint  Patient presents with  . Fatigue    x several months-getting worse recently  . wieght gain    HPI: The patient is a 33 y.o. female who presents today for fatigue that has been worsening over the past couple of months, weight gain.  Has gained 7 lbs since her last visit in September of this year. She has generally not feeling well since the last several months. She did have covid several months ago, but feels worse now then when she had covid. She did have her IUD taken out and was put on birth control in September. She did stop her birth control this month to see if this was contributing. She has been off x 1 month and hasn't noticed a difference. She also gained 7 pounds, but she feels like this was all at once. She also feels like her hair has been falling out, but wasn't sure if due to covid. No bald spots, just excessive amounts she is shedding. She just doesn't feel well. She is trying to work out.   She does have night sweats, but no fever. She has ehlers danlos and has always bruised easily.  No blood in stool. When asked if depressed she feels frustrated with how she has physically been feeling. She has joint pain, but this is due to her ED. Sleep has not been as good. Hard for her to fall asleep. She gets only 4-5 hours sleep at night. She has cut sugar out recently as well. This hasn't really helped her.   No FH of autoimmune/thyroid that she is aware of.   Review of Systems  Constitutional: Positive for activity change, diaphoresis, fatigue and unexpected weight change. Negative for appetite change.  Eyes: Negative for visual disturbance.  Respiratory: Negative for shortness of breath.   Cardiovascular: Negative for chest pain, palpitations and leg swelling.  Gastrointestinal: Positive for constipation. Negative for abdominal pain, diarrhea, nausea and vomiting.   Endocrine: Positive for cold intolerance and polydipsia. Negative for heat intolerance and polyuria.       Pt states she is always thirsty, seems to be getting worse over the past few weeks  Genitourinary: Negative for dysuria, frequency and urgency.  Skin:       Has noticed that she is more itchy all over recently.  Particularly on her arms and legs  Neurological: Positive for headaches. Negative for dizziness.  Psychiatric/Behavioral: Positive for decreased concentration and sleep disturbance. Negative for agitation.    Allergies Patient is allergic to cephalexin.  Past Medical History Patient  has a past medical history of Anxiety, Asthma, ASTHMA (01/27/2008), Depression, Ehlers-Danlos disease, Headache, Hypertension affecting pregnancy (2019), IBS (irritable bowel syndrome), Migraines, Scoliosis, and Vaginal Pap smear, abnormal.  Surgical History Patient  has a past surgical history that includes Cholecystectomy (2002); Hysteroscopy w/D&C (2004); Knee arthroscopy (1999 & 2000); Tonsillectomy; Colposcopy; and Combined hysteroscopy diagnostic / D&C (2004).  Family History Pateint's family history includes Arthritis in her maternal grandmother; Depression in her mother; Heart attack in her maternal grandmother; Heart failure in her maternal grandmother; Hyperlipidemia in her mother; Hypertension in her maternal grandfather, maternal grandmother, and mother; Kidney disease in her maternal grandfather; Thyroid disease in her maternal grandmother.  Social History Patient  reports that she has never smoked. She has never used smokeless tobacco. She reports that she does not drink alcohol or use drugs.  Objective: Vitals:   07/27/19 1008  BP: 104/80  Pulse: 89  Temp: 98 F (36.7 C)  TempSrc: Skin  SpO2: 98%  Weight: 154 lb (69.9 kg)  Height: 5\' 7"  (1.702 m)    Body mass index is 24.12 kg/m.  Physical Exam Vitals signs reviewed.  Constitutional:      Appearance: Normal  appearance. She is well-developed and normal weight.  HENT:     Head: Normocephalic and atraumatic.     Right Ear: Tympanic membrane, ear canal and external ear normal.     Left Ear: Tympanic membrane, ear canal and external ear normal.     Nose: Nose normal.     Mouth/Throat:     Mouth: Mucous membranes are moist.  Eyes:     Extraocular Movements: Extraocular movements intact.     Conjunctiva/sclera: Conjunctivae normal.     Pupils: Pupils are equal, round, and reactive to light.  Neck:     Musculoskeletal: Normal range of motion and neck supple.     Thyroid: No thyromegaly.  Cardiovascular:     Rate and Rhythm: Normal rate and regular rhythm.     Heart sounds: Normal heart sounds. No murmur.  Pulmonary:     Effort: Pulmonary effort is normal.     Breath sounds: Normal breath sounds.  Abdominal:     General: Abdomen is flat. Bowel sounds are normal. There is no distension.     Palpations: Abdomen is soft.     Tenderness: There is no abdominal tenderness.  Musculoskeletal:     Comments: She does have point ttp on 8/10 trigger points for fibro   Lymphadenopathy:     Cervical: No cervical adenopathy.  Skin:    General: Skin is warm and dry.     Capillary Refill: Capillary refill takes less than 2 seconds.     Findings: No rash.  Neurological:     General: No focal deficit present.     Mental Status: She is alert and oriented to person, place, and time.     Cranial Nerves: No cranial nerve deficit.     Coordination: Coordination normal.     Deep Tendon Reflexes: Reflexes normal.  Psychiatric:        Mood and Affect: Mood normal.        Behavior: Behavior normal.      Office Visit from 07/27/2019 in [facility]  PHQ-9 Total Score  8         Assessment/plan: 1. Other fatigue Large differential including long hauler covid, fibromyalgia, thyroid, autoimmune, depression,etc. Will check labs and go from there. Want her to work on sleep and exercise.  Close f/u in one month.  - CBC with Differential/Platelet - Comprehensive metabolic panel - TSH - VITAMIN D 25 Hydroxy (Vit-D Deficiency, Fractures) - ANA w/reflex - B. burgdorfi antibodies by WB  2. COVID-19 Had covid in June, want to see if still has Ab.  - covid antibody - SAR CoV2 Serology (COVID 19)AB(IGG)IA   This visit occurred during the SARS-CoV-2 public health emergency.  Safety protocols were in place, including screening questions prior to the visit, additional usage of staff PPE, and extensive cleaning of exam room while observing appropriate contact time as indicated for disinfecting solutions.    Return in about 1 month (around 08/27/2019) for f/u of fatigue .   10/25/2019, MD [place] [facility]   07/27/2019

## 2019-07-27 NOTE — Patient Instructions (Signed)
ashwagandha for sleep . It's natural and you take 300mg  bout 30 min before bed. Can get at herbal store or [place].   Start exercising more.   Look up fibromyalgia as well. Part of differential.   Lots of labs and then we will go from there!  Merry christmas! Dr. Rogers Blocker

## 2019-07-28 LAB — SAR COV2 SEROLOGY (COVID19)AB(IGG),IA: SARS CoV2 AB IGG: NEGATIVE

## 2019-07-30 ENCOUNTER — Other Ambulatory Visit: Payer: Self-pay | Admitting: Family Medicine

## 2019-07-30 ENCOUNTER — Encounter: Payer: Self-pay | Admitting: Family Medicine

## 2019-07-30 DIAGNOSIS — E559 Vitamin D deficiency, unspecified: Secondary | ICD-10-CM | POA: Insufficient documentation

## 2019-07-30 MED ORDER — VITAMIN D (ERGOCALCIFEROL) 1.25 MG (50000 UNIT) PO CAPS: ORAL_CAPSULE | ORAL | 0 refills | Status: DC

## 2019-07-30 MED ORDER — AMPHETAMINE-DEXTROAMPHET ER 30 MG PO CP24: 30.0000 mg | ORAL_CAPSULE | Freq: Every day | ORAL | 0 refills | Status: DC

## 2019-07-30 MED FILL — VIT D2 1.25 MG (50,000 UNIT: 1.25 MG | 84 days supply | Qty: 12 | Fill #0

## 2019-07-30 MED FILL — ADDERALL XR 30 MG CAP SA: 30 | 30 days supply | Qty: 30 | Fill #0

## 2019-07-30 NOTE — Telephone Encounter (Signed)
Last OV: 07/27/19 Next OV: None scheduled at this time PMP website checked: last filled on 06/14/19

## 2019-07-31 LAB — B. BURGDORFI ANTIBODIES BY WB
B burgdorferi IgG Abs (IB): NEGATIVE
B burgdorferi IgM Abs (IB): NEGATIVE
Lyme Disease 18 kD IgG: NONREACTIVE
Lyme Disease 23 kD IgG: NONREACTIVE
Lyme Disease 23 kD IgM: NONREACTIVE
Lyme Disease 28 kD IgG: NONREACTIVE
Lyme Disease 30 kD IgG: NONREACTIVE
Lyme Disease 39 kD IgG: NONREACTIVE
Lyme Disease 39 kD IgM: REACTIVE — AB
Lyme Disease 41 kD IgG: REACTIVE — AB
Lyme Disease 41 kD IgM: NONREACTIVE
Lyme Disease 45 kD IgG: NONREACTIVE
Lyme Disease 58 kD IgG: NONREACTIVE
Lyme Disease 66 kD IgG: NONREACTIVE
Lyme Disease 93 kD IgG: NONREACTIVE

## 2019-07-31 LAB — ANA: Anti Nuclear Antibody (ANA): NEGATIVE

## 2019-08-01 ENCOUNTER — Ambulatory Visit: Payer: 59 | Admitting: Physical Therapy

## 2019-08-01 MED FILL — SUMATRIPTAN SUCC 100 MG TAB: 100 | 30 days supply | Qty: 9 | Fill #2

## 2019-08-01 MED FILL — SERTRALINE HCL 100 MG TAB: 100 | 90 days supply | Qty: 90 | Fill #0

## 2019-08-01 MED FILL — hydrOXYzine HCL 25 MG TABS: 25 | 90 days supply | Qty: 90 | Fill #1

## 2019-08-14 ENCOUNTER — Telehealth: Payer: 59 | Admitting: Emergency Medicine

## 2019-08-14 DIAGNOSIS — L309 Dermatitis, unspecified: Secondary | ICD-10-CM

## 2019-08-14 MED ORDER — NYSTATIN-TRIAMCINOLONE 100000-0.1 UNIT/GM-% EX OINT: 1.0000 "application " | TOPICAL_OINTMENT | Freq: Two times a day (BID) | CUTANEOUS | 0 refills | Status: DC

## 2019-08-14 MED ORDER — NYSTATIN-TRIAMCINOLONE 100000-0.1 UNIT/GM-% EX CREA: TOPICAL_CREAM | Freq: Two times a day (BID) | CUTANEOUS | Status: DC

## 2019-08-14 MED FILL — NYSTATIN-TRIAMCINOLONE OINT: 100000-0.1 | 10 days supply | Qty: 30 | Fill #0

## 2019-08-14 NOTE — Progress Notes (Signed)
E Visit for Rash  We are sorry that you are not feeling well. Here is how we plan to help!  This sounds similar to your prior episodes of eczema, you can treat it as you have previously.  I have sent the combination cream requested to your pharmacy.   HOME CARE:   Take cool showers and avoid direct sunlight.  Apply cool compress or wet dressings.  Take a bath in an oatmeal bath.  Sprinkle content of one Aveeno packet under running faucet with comfortably warm water.  Bathe for 15-20 minutes, 1-2 times daily.  Pat dry with a towel. Do not rub the rash.  Use hydrocortisone cream.  Take an antihistamine like Benadryl for widespread rashes that itch.  The adult dose of Benadryl is 25-50 mg by mouth 4 times daily.  Caution:  This type of medication may cause sleepiness.  Do not drink alcohol, drive, or operate dangerous machinery while taking antihistamines.  Do not take these medications if you have prostate enlargement.  Read package instructions thoroughly on all medications that you take.  GET HELP RIGHT AWAY IF:   Symptoms don't go away after treatment.  Severe itching that persists.  If you rash spreads or swells.  If you rash begins to smell.  If it blisters and opens or develops a yellow-brown crust.  You develop a fever.  You have a sore throat.  You become short of breath.  MAKE SURE YOU:  Understand these instructions. Will watch your condition. Will get help right away if you are not doing well or get worse.  Thank you for choosing an e-visit. Your e-visit answers were reviewed by a board certified advanced clinical practitioner to complete your personal care plan. Depending upon the condition, your plan could have included both over the counter or prescription medications. Please review your pharmacy choice. Be sure that the pharmacy you have chosen is open so that you can pick up your prescription now.  If there is a problem you may message your provider in  [place] to have the prescription routed to another pharmacy. Your safety is important to [place]. If you have drug allergies check your prescription carefully.  For the next 24 hours, you can use MyChart to ask questions about today's visit, request a non-urgent call back, or ask for a work or school excuse from your e-visit provider. You will get an email in the next two days asking about your experience. I hope that your e-visit has been valuable and will speed your recovery.   Approximately 5 minutes was used in reviewing the patient's chart, questionnaire, prescribing medications, and documentation.

## 2019-08-14 NOTE — Addendum Note (Signed)
Addended by: Montine Circle B on: 08/14/2019 03:23 PM   Modules accepted: Orders

## 2019-08-15 DIAGNOSIS — J020 Streptococcal pharyngitis: Secondary | ICD-10-CM | POA: Diagnosis not present

## 2019-08-16 MED FILL — AMOXICILLIN 500 MG CAPSULE: 500 | 10 days supply | Qty: 30 | Fill #0

## 2019-08-25 ENCOUNTER — Other Ambulatory Visit: Payer: Self-pay | Admitting: Family Medicine

## 2019-08-27 ENCOUNTER — Other Ambulatory Visit: Payer: Self-pay | Admitting: Family Medicine

## 2019-08-27 ENCOUNTER — Other Ambulatory Visit: Payer: Self-pay

## 2019-08-27 MED ORDER — AMPHETAMINE-DEXTROAMPHET ER 30 MG PO CP24: 30.0000 mg | ORAL_CAPSULE | Freq: Every day | ORAL | 0 refills | Status: DC

## 2019-08-27 MED FILL — ADDERALL XR 30 MG CAP SA: 30 | 30 days supply | Qty: 30 | Fill #0

## 2019-08-27 NOTE — Progress Notes (Signed)
Last OV: 07/27/19 for fatigue (last OV for med check was 05/11/19) Next OV: None scheduled at this time PMP website checked: last filled on 07/30/19

## 2019-08-27 NOTE — Telephone Encounter (Signed)
Sent to Dr. Artis Flock for approval.

## 2019-08-28 MED FILL — BACLOFEN 10 MG TABS: 10 | 30 days supply | Qty: 90 | Fill #1

## 2019-08-28 NOTE — Telephone Encounter (Signed)
Rx request 

## 2019-08-29 MED ORDER — AMPHETAMINE-DEXTROAMPHET ER 30 MG PO CP24: 30.0000 mg | ORAL_CAPSULE | Freq: Every day | ORAL | 0 refills | Status: DC

## 2019-08-29 MED ORDER — AMPHETAMINE-DEXTROAMPHETAMINE 5 MG PO TABS: 5.0000 mg | ORAL_TABLET | Freq: Every day | ORAL | 0 refills | Status: DC

## 2019-08-29 MED FILL — AMPHETAMINE-DEXTROAMPHETAMI: 5 | 30 days supply | Qty: 30 | Fill #0

## 2019-08-30 MED FILL — SUMATRIPTAN SUCC 100 MG TAB: 100 | 30 days supply | Qty: 9 | Fill #3

## 2019-09-07 ENCOUNTER — Encounter: Payer: Self-pay | Admitting: Family Medicine

## 2019-09-10 ENCOUNTER — Ambulatory Visit (INDEPENDENT_AMBULATORY_CARE_PROVIDER_SITE_OTHER): Payer: No Typology Code available for payment source | Admitting: Family Medicine

## 2019-09-10 ENCOUNTER — Encounter: Payer: Self-pay | Admitting: Family Medicine

## 2019-09-10 VITALS — Ht 67.0 in | Wt 152.0 lb

## 2019-09-10 DIAGNOSIS — F900 Attention-deficit hyperactivity disorder, predominantly inattentive type: Secondary | ICD-10-CM

## 2019-09-10 NOTE — Progress Notes (Signed)
Patient: Alison Nichols MRN: [id number] DOB: 08/15/1986 PCP: Orland Mustard, MD     Subjective:  Chief Complaint  Patient presents with  . ADHD    HPI: The patient is a 34 y.o. female who presents today for ADHD follow up.   She is currently on adderall 30mg  XR daily for her adhd. She has a 5mg  ir as needed as well. We increased her adderall to 30mg  at her last adhd visit back in September. She does think this helps with her attention at work. She works 12 hour shifts and has needed her 5mg  tablet more, especially with the increase in covid cases. Takes as prescribed. Denies any side effects. Has been taking ashwagandha and this has helped with sleep as well.   Review of Systems  Constitutional: Negative for fatigue.  Eyes: Negative for visual disturbance.  Cardiovascular: Negative for chest pain and palpitations.  Gastrointestinal: Negative for abdominal pain and nausea.  Neurological: Positive for headaches. Negative for dizziness.  Psychiatric/Behavioral: Negative for sleep disturbance.    Allergies Patient is allergic to cephalexin.  Past Medical History Patient  has a past medical history of Anxiety, Asthma, ASTHMA (01/27/2008), Depression, Ehlers-Danlos disease, Headache, Hypertension affecting pregnancy (2019), IBS (irritable bowel syndrome), Migraines, Scoliosis, and Vaginal Pap smear, abnormal.  Surgical History Patient  has a past surgical history that includes Cholecystectomy (2002); Hysteroscopy with D & C (2004); Knee arthroscopy (1999 & 2000); Tonsillectomy; Colposcopy; and Combined hysteroscopy diagnostic / D&C (2004).  Family History Pateint's family history includes Arthritis in her maternal grandmother; Depression in her mother; Heart attack in her maternal grandmother; Heart failure in her maternal grandmother; Hyperlipidemia in her mother; Hypertension in her maternal grandfather, maternal grandmother, and mother; Kidney disease in her maternal grandfather;  Thyroid disease in her maternal grandmother.  Social History Patient  reports that she has never smoked. She has never used smokeless tobacco. She reports that she does not drink alcohol or use drugs.    Objective: Vitals:   09/10/19 1056  Weight: 152 lb (68.9 kg)  Height: 5\' 7"  (1.702 m)    Body mass index is 23.81 kg/m.  Physical Exam Vitals reviewed.  Constitutional:      Appearance: Normal appearance. She is normal weight.  HENT:     Head: Normocephalic and atraumatic.  Pulmonary:     Effort: Pulmonary effort is normal.  Neurological:     Mental Status: She is alert and oriented to person, place, and time.  Psychiatric:        Mood and Affect: Mood normal.        Behavior: Behavior normal.        Assessment/plan: 1. ADHD, predominantly inattentive type pmpwebiste checked and filling correctly. Drug holidays prn. Higher dose working well for her. No refills needed at this time. F/u in 3 months for routine appointment.    This visit occurred during the SARS-CoV-2 public health emergency.  Safety protocols were in place, including screening questions prior to the visit, additional usage of staff PPE, and extensive cleaning of exam room while observing appropriate contact time as indicated for disinfecting solutions.     Return in about 3 months (around 12/09/2019) for adhd.   2001, MD [place] [facility]   09/10/2019

## 2019-09-20 ENCOUNTER — Encounter: Payer: Self-pay | Admitting: Family Medicine

## 2019-09-21 ENCOUNTER — Other Ambulatory Visit: Payer: Self-pay

## 2019-09-21 MED ORDER — ALBUTEROL SULFATE HFA 108 (90 BASE) MCG/ACT IN AERS: 1.0000 | INHALATION_SPRAY | Freq: Four times a day (QID) | RESPIRATORY_TRACT | 2 refills | Status: DC | PRN

## 2019-09-21 MED FILL — ALBUTEROL SULFATE HFA 108 (: 108 (90 BAS | 25 days supply | Qty: 18 | Fill #0

## 2019-09-27 ENCOUNTER — Other Ambulatory Visit: Payer: Self-pay | Admitting: *Deleted

## 2019-09-27 MED ORDER — SUMATRIPTAN SUCCINATE 100 MG PO TABS: ORAL_TABLET | ORAL | 3 refills | Status: DC

## 2019-09-27 MED FILL — SUMATRIPTAN SUCC 100 MG TAB: 100 | 30 days supply | Qty: 9 | Fill #0

## 2019-10-08 ENCOUNTER — Encounter: Payer: Self-pay | Admitting: Family Medicine

## 2019-10-08 ENCOUNTER — Ambulatory Visit
Admission: EM | Admit: 2019-10-08 | Discharge: 2019-10-08 | Disposition: A | Discharge status: Home / Self Care | Payer: No Typology Code available for payment source | Attending: Physician Assistant | Admitting: Physician Assistant

## 2019-10-08 DIAGNOSIS — M545 Low back pain, unspecified: Secondary | ICD-10-CM

## 2019-10-08 MED ORDER — TIZANIDINE HCL 4 MG PO TABS: 4.0000 mg | ORAL_TABLET | Freq: Three times a day (TID) | ORAL | 0 refills | Status: DC | PRN

## 2019-10-08 MED ORDER — PREDNISONE 50 MG PO TABS: 50.0000 mg | ORAL_TABLET | Freq: Every day | ORAL | 0 refills | Status: DC

## 2019-10-08 MED FILL — predniSONE 50 MG TABS: 50 | 5 days supply | Qty: 5 | Fill #0

## 2019-10-08 MED FILL — tiZANidine HCL 4 MG TABS: 4 | 5 days supply | Qty: 15 | Fill #0

## 2019-10-08 NOTE — Telephone Encounter (Signed)
Please Advise

## 2019-10-08 NOTE — Discharge Instructions (Signed)
Prednisone as directed. Can discontinue baclofen and try tizanidine as needed, this can make you drowsy, so do not take if you are going to drive, operate heavy machinery, or make important decisions. Ice/heat compresses as needed. This can take up to 3-4 weeks to completely resolve, but you should be feeling better each week. Follow up with PCP/orthopedics if symptoms worsen, changes for reevaluation. If experience numbness/tingling of the inner thighs, loss of bladder or bowel control, go to the emergency department for evaluation.

## 2019-10-08 NOTE — ED Provider Notes (Signed)
[facility]    CSN: [id number] Arrival date & time: 10/08/19  1601      History   Chief Complaint Chief Complaint  Patient presents with  . Back Pain    HPI Alison Nichols is a 34 y.o. female.   34 year old female with history of Ehlers-Danlos disease, asthma, migraine comes in for 3-day history of right lower back pain.  Denies specific injury/trauma.  However, pain started after bending when putting daughter down to change diaper.  Pain is to the right lumbar/buttock region, throbbing sensation, worse with position change, specifically sitting to standing.  Pain can radiate down to her right anterior thigh at times.  Denies loss of bladder or bowel control.  Has intermittent numbness, tingling to the buttock without saddle anesthesia.  She has baclofen that she usually takes for neck stiffness/tension headaches, she has been taking this without much relief.  Has been taking ibuprofen 800 mg 2-3 times a day with minimal relief, but now with stomach upset and therefore discontinued.  Has been taking Tylenol with little relief.     Past Medical History:  Diagnosis Date  . Anxiety   . Asthma   . ASTHMA 01/27/2008   Qualifier: Diagnosis of  By: Thereasa Solo    . Depression   . Ehlers-Danlos disease   . Headache   . Hypertension affecting pregnancy 2019  . IBS (irritable bowel syndrome)   . Migraines   . Scoliosis   . Vaginal Pap smear, abnormal     Patient Active Problem List   Diagnosis Date Noted  . Vitamin D deficiency 07/30/2019  . 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 Anxiety 03/13/2018  . Gestational hypertension 02/21/2018  . EHLERS-DANLOS SYNDROME 09/01/2009  . Mild asthma without complication 01/27/2008  . GERD 01/27/2008  . Migraine headache without aura 01/27/2008  . HEMORRHOIDS, EXTERNAL 02/18/2006    Past Surgical History:  Procedure Laterality Date  . CHOLECYSTECTOMY  2002  . COLPOSCOPY    . COMBINED HYSTEROSCOPY  DIAGNOSTIC / D&C  2004  . HYSTEROSCOPY WITH D & C  2004  . KNEE ARTHROSCOPY  1999 & 2000  . TONSILLECTOMY     w/ Adenoidectomy    OB History    Gravida  5   Para  3   Term  1   Preterm  2   AB  2   Living  3     SAB  2   TAB      Ectopic      Multiple  0   Live Births  3            Home Medications    Prior to Admission medications   Medication Sig Start Date End Date Taking? Authorizing Provider  albuterol (VENTOLIN HFA) 108 (90 Base) MCG/ACT inhaler albuterol sulfate HFA 90 mcg/actuation aerosol inhaler    [provider]  albuterol (VENTOLIN HFA) 108 (90 Base) MCG/ACT inhaler Inhale 1-2 puffs into the lungs every 6 (six) hours as needed for wheezing or shortness of breath. 09/21/19   Orland Mustard, MD  amphetamine-dextroamphetamine (ADDERALL XR) 30 MG 24 hr capsule Take 1 capsule (30 mg total) by mouth daily. 08/29/19   Orland Mustard, MD  baclofen (LIORESAL) 10 MG tablet Take 1 tablet (10 mg total) by mouth 3 (three) times daily. 05/11/19   Orland Mustard, MD  hydrOXYzine (ATARAX/VISTARIL) 25 MG tablet Take 1 tablet (25 mg total) by mouth at bedtime as needed for itching. 05/01/19   Artis Flock,  Ebony Hail, MD  meloxicam (MOBIC) 7.5 MG tablet Take 1 tablet (7.5 mg total) by mouth 2 (two) times daily as needed for pain. 06/05/19   Leandrew Koyanagi, MD  predniSONE (DELTASONE) 50 MG tablet Take 1 tablet (50 mg total) by mouth daily with breakfast. 10/08/19   Tasia Catchings, Marnette Perkins V, PA-C  sertraline (ZOLOFT) 100 MG tablet Take 1 tablet (100 mg total) by mouth daily. 01/18/19   Orma Flaming, MD  SUMAtriptan (IMITREX) 100 MG tablet Take 100mg  by mouth at onset of headache. May repeat in 2 hours if headache persists or recurs. No more than 2/24 hours. 09/27/19   Orma Flaming, MD  tiZANidine (ZANAFLEX) 4 MG tablet Take 1 tablet (4 mg total) by mouth every 8 (eight) hours as needed for muscle spasms. 10/08/19   Tasia Catchings, Emer Onnen V, PA-C  Vitamin D, Ergocalciferol, (DRISDOL) 1.25 MG (50000 UT) CAPS  capsule One capsule by mouth once a week for 12 weeks. Then take 2000IU/day 07/30/19   Orma Flaming, MD    Family History Family History  Problem Relation Age of Onset  . Heart failure Maternal Grandmother   . Hypertension Maternal Grandmother   . Thyroid disease Maternal Grandmother   . Arthritis Maternal Grandmother   . Heart attack Maternal Grandmother   . Depression Mother   . Hyperlipidemia Mother   . Hypertension Mother   . Hypertension Maternal Grandfather   . Kidney disease Maternal Grandfather     Social History Social History   Tobacco Use  . Smoking status: Never Smoker  . Smokeless tobacco: Never Used  Substance Use Topics  . Alcohol use: No  . Drug use: No     Allergies   Cephalexin   Review of Systems Review of Systems  Reason unable to perform ROS: See HPI as above.     Physical Exam Triage Vital Signs ED Triage Vitals [10/08/19 1619]  Enc Vitals Group     BP 138/90     Pulse Rate 84     Resp 18     Temp 98 F (36.7 C)     Temp Source Oral     SpO2 98 %     Weight      Height      Head Circumference      Peak Flow      Pain Score 6     Pain Loc      Pain Edu?      Excl. in [place]?    No data found.  Updated Vital Signs BP 138/90 (BP Location: Left Arm)   Pulse 84   Temp 98 F (36.7 C) (Oral)   Resp 18   LMP 09/23/2019   SpO2 98%   Breastfeeding No   Physical Exam Constitutional:      General: She is not in acute distress.    Appearance: She is well-developed. She is not diaphoretic.  HENT:     Head: Normocephalic and atraumatic.  Eyes:     Conjunctiva/sclera: Conjunctivae normal.     Pupils: Pupils are equal, round, and reactive to light.  Cardiovascular:     Rate and Rhythm: Normal rate and regular rhythm.     Heart sounds: Normal heart sounds. No murmur. No friction rub. No gallop.   Pulmonary:     Effort: Pulmonary effort is normal. No accessory muscle usage or respiratory distress.     Breath sounds: Normal breath  sounds. No stridor. No decreased breath sounds, wheezing, rhonchi or rales.  Musculoskeletal:  Comments: Right lumbar scar from prior procedure.  No rashes, erythema, warmth, contusion.  No tenderness on palpation of the spinous processes.  Tenderness to palpation of right lumbar region/paraspinal muscles. Full range of motion of back and hips. Strength normal and equal bilaterally. Sensation intact. Negative straight leg raise.   Skin:    General: Skin is warm and dry.  Neurological:     Mental Status: She is alert and oriented to person, place, and time.    UC Treatments / Results  Labs (all labs ordered are listed, but only abnormal results are displayed) Labs Reviewed - No data to display  EKG   Radiology No results found.  Procedures Procedures (including critical care time)  Medications Ordered in UC Medications - No data to display  Initial Impression / Assessment and Plan / UC Course  I have reviewed the triage vital signs and the nursing notes.  Pertinent labs & imaging results that were available during my care of the patient were reviewed by me and considered in my medical decision making (see chart for details).    Prednisone as directed. Discussed switching baclofen to tizanidine as a trial. Patient was agreeable. Muscle relaxant as needed. Ice/heat compresses. Discussed with patient this can take up to 3-4 weeks to resolve, but should be getting better each week. Return precautions given.   Final Clinical Impressions(s) / UC Diagnoses   Final diagnoses:  Acute right-sided low back pain without sciatica    ED Prescriptions    Medication Sig Dispense Auth. Provider   tiZANidine (ZANAFLEX) 4 MG tablet Take 1 tablet (4 mg total) by mouth every 8 (eight) hours as needed for muscle spasms. 15 tablet Marykate Heuberger V, PA-C   predniSONE (DELTASONE) 50 MG tablet Take 1 tablet (50 mg total) by mouth daily with breakfast. 5 tablet Belinda Fisher, PA-C     PDMP not reviewed  this encounter.   Belinda Fisher, PA-C 10/08/19 1701

## 2019-10-08 NOTE — ED Triage Notes (Signed)
Pt c/o lower back pain radiating down rt leg since Saturday. Pt denies injury.

## 2019-10-09 MED FILL — SUMATRIPTAN SUCC 100 MG TAB: 100 | 30 days supply | Qty: 9 | Fill #0

## 2019-10-11 ENCOUNTER — Other Ambulatory Visit: Payer: Self-pay | Admitting: Family Medicine

## 2019-10-11 MED ORDER — AMPHETAMINE-DEXTROAMPHET ER 30 MG PO CP24: 30.0000 mg | ORAL_CAPSULE | Freq: Every day | ORAL | 0 refills | Status: DC

## 2019-10-11 MED FILL — ADDERALL XR 30 MG CAP SA: 30 | 30 days supply | Qty: 30 | Fill #0

## 2019-10-11 NOTE — Telephone Encounter (Signed)
Requesting refill on Adderall. Last visit 09/10/2019.

## 2019-10-17 DIAGNOSIS — F900 Attention-deficit hyperactivity disorder, predominantly inattentive type: Secondary | ICD-10-CM

## 2019-10-17 HISTORY — DX: Attention-deficit hyperactivity disorder, predominantly inattentive type: F90.0

## 2019-10-18 MED FILL — HYDROCORTISONE AC 25 MG SUP: 25 | 12 days supply | Qty: 24 | Fill #0

## 2019-10-18 MED FILL — buPROPion HCL ER (XL) 150 M: 150 | 30 days supply | Qty: 60 | Fill #0

## 2019-10-23 MED FILL — HYDROCORT-PRAMOXINE 2.5-1%: 2.5-1 | 15 days supply | Qty: 30 | Fill #0

## 2019-11-05 ENCOUNTER — Other Ambulatory Visit: Payer: Self-pay

## 2019-11-05 ENCOUNTER — Encounter: Payer: Self-pay | Admitting: Family Medicine

## 2019-11-05 MED ORDER — HYDROXYZINE HCL 25 MG PO TABS: 25.0000 mg | ORAL_TABLET | Freq: Every evening | ORAL | 1 refills | Status: DC | PRN

## 2019-11-05 MED FILL — hydrOXYzine HCL 25 MG TABS: 25 | 90 days supply | Qty: 90 | Fill #0

## 2019-11-06 ENCOUNTER — Other Ambulatory Visit: Payer: Self-pay

## 2019-11-06 ENCOUNTER — Encounter: Payer: Self-pay | Admitting: Sports Medicine

## 2019-11-06 ENCOUNTER — Ambulatory Visit: Payer: No Typology Code available for payment source | Admitting: Sports Medicine

## 2019-11-06 VITALS — BP 120/84 | Ht 67.0 in | Wt 150.0 lb

## 2019-11-06 DIAGNOSIS — Q796 Ehlers-Danlos syndrome, unspecified: Secondary | ICD-10-CM | POA: Diagnosis not present

## 2019-11-06 NOTE — Patient Instructions (Addendum)
It was great to see you!  Our plans for today:  - Do the following exercises:  - Shoulder - shoulder fly exercises, internal rotation of shoulder with weights, upright row with weights (make sure to stop your elbows at your chest). Do 3 sets of 10 daily.  - Neck - isometric exercises in each of 4 positions 5 times. (anterior, posterior, right, left) for 10 seconds at a time.   - Hip - lateral leg lifts, lateral step ups, forward step ups.  - Pelvic floor - while sitting in wall sit position, tighten posterior to anterior, anus to vagina. In standing squat position, tighten posteriorly to anterior. Forward side lunge while tightening pelvic floor. Do 5x count of 10. Crunches will also be helpful to stabilize your pelvic floor. - look into PT Pilates. The physical therapists there can help with strengthening exercises and work with EDS patients.  Follow up as needed.

## 2019-11-06 NOTE — Assessment & Plan Note (Signed)
Hypermobility likely contributing to pain. No findings to concern for trauma, injury, arthritis, infection. Recommend strengthening exercises of shoulder, hip, neck, and pelvic floor for strengthening and prevention of subluxation, see AVS for details. Also recommend PT Pilates for further strengthening. Follow up as needed.

## 2019-11-06 NOTE — Progress Notes (Addendum)
    SUBJECTIVE:   CHIEF COMPLAINT: joint pain  HPI:   DX by Dr. Peggye Form  Skin biopsy did not show vascular type EDS  Patient with h/o HM EDS reports bilateral shoulder, knee, and hip pain that has progressed over the last several months. Had work up at PCP office for fatigue and feeling unwell 07/2019. Lab work up was largely unrevealing. Started vit D supplements for low vit D.   Reports her L shoulder is the worst. Worse with sitting at the computer at work, lifting overhead. Has occasional subluxation of bilateral hips without dislocation. Knee pain is worse with bending. Changing positions from sitting to standing hurts. Previously did PT for about 8 weeks. Is currently working out with [employer] at home. No trauma or injury.  PERTINENT  PMH / PSH: migraine, gHTN, asthma, EDS, anxiety, ADHD, vit D deficiency  OBJECTIVE:   BP 120/84   Ht 5\' 7"  (1.702 m)   Wt 150 lb (68 kg)   BMI 23.49 kg/m   Gen: well appearing, in NAD MSK: Shoulder: normal inspection, nonTTP. Full AROM with hypermobility. 5/5 strength with forward flexion, abduction. Negative Hawkins, empty can. +sulcus sign. Hip: normal inspection. NonTTP. Hypermobile AROM with 135 degrees of R rotation and 130 degrees of L hip rotation. Full flexion. Negative FADIR.  Thumb to wrist and MCP 5 > 90 deg extesnions Elbow hyperextension - 5 to 10 deg  Skin with wide scarring  ASSESSMENT/PLAN:   EHLERS-DANLOS SYNDROME Hypermobility likely contributing to pain. No findings to concern for trauma, injury, arthritis, infection. Recommend strengthening exercises of shoulder, hip, neck, and pelvic floor for strengthening and prevention of subluxation, see AVS for details. Also recommend PT Pilates for further strengthening. Follow up as needed.    , DO PGY-3, [facility] 11/06/2019 5:24 PM   I observed and examined the patient with the resident and agree with assessment and plan.  Note  reviewed and modified by me.  Recommended PT/Pilates. 11/08/2019, MD

## 2019-11-07 ENCOUNTER — Telehealth: Payer: No Typology Code available for payment source | Admitting: Nurse Practitioner

## 2019-11-07 DIAGNOSIS — R05 Cough: Secondary | ICD-10-CM | POA: Diagnosis not present

## 2019-11-07 DIAGNOSIS — R059 Cough, unspecified: Secondary | ICD-10-CM

## 2019-11-07 MED ORDER — AZITHROMYCIN 250 MG PO TABS: ORAL_TABLET | ORAL | 0 refills | Status: DC

## 2019-11-07 MED FILL — AZITHROMYCIN 250 MG TABLET: 250 | 5 days supply | Qty: 6 | Fill #0

## 2019-11-07 NOTE — Progress Notes (Signed)
We are sorry that you are not feeling well.  Here is how we plan to help!  Based on your presentation I believe you most likely have A cough due to bacteria.  When patients have a fever and a productive cough with a change in color or increased sputum production, we are concerned about bacterial bronchitis.  If left untreated it can progress to pneumonia.  If your symptoms do not improve with your treatment plan it is important that you contact your provider.   I have prescribed Azithromyin 250 mg: two tablets now and then one tablet daily for 4 additonal days    In addition you may use A non-prescription cough medication called Mucinex DM: take 2 tablets every 12 hours.   From your responses in the eVisit questionnaire you describe inflammation in the upper respiratory tract which is causing a significant cough.  This is commonly called Bronchitis and has four common causes:    Allergies  Viral Infections  Acid Reflux  Bacterial Infection Allergies, viruses and acid reflux are treated by controlling symptoms or eliminating the cause. An example might be a cough caused by taking certain blood pressure medications. You stop the cough by changing the medication. Another example might be a cough caused by acid reflux. Controlling the reflux helps control the cough.  USE OF BRONCHODILATOR ("RESCUE") INHALERS: There is a risk from using your bronchodilator too frequently.  The risk is that over-reliance on a medication which only relaxes the muscles surrounding the breathing tubes can reduce the effectiveness of medications prescribed to reduce swelling and congestion of the tubes themselves.  Although you feel brief relief from the bronchodilator inhaler, your asthma may actually be worsening with the tubes becoming more swollen and filled with mucus.  This can delay other crucial treatments, such as oral steroid medications. If you need to use a bronchodilator inhaler daily, several times per day,  you should discuss this with your provider.  There are probably better treatments that could be used to keep your asthma under control.     HOME CARE . Only take medications as instructed by your medical team. . Complete the entire course of an antibiotic. . Drink plenty of fluids and get plenty of rest. . Avoid close contacts especially the very young and the elderly . Cover your mouth if you cough or cough into your sleeve. . Always remember to wash your hands . A steam or ultrasonic humidifier can help congestion.   GET HELP RIGHT AWAY IF: . You develop worsening fever. . You become short of breath . You cough up blood. . Your symptoms persist after you have completed your treatment plan MAKE SURE YOU   Understand these instructions.  Will watch your condition.  Will get help right away if you are not doing well or get worse.  Your e-visit answers were reviewed by a board certified advanced clinical practitioner to complete your personal care plan.  Depending on the condition, your plan could have included both over the counter or prescription medications. If there is a problem please reply  once you have received a response from your provider. Your safety is important to us.  If you have drug allergies check your prescription carefully.    You can use MyChart to ask questions about today's visit, request a non-urgent call back, or ask for a work or school excuse for 24 hours related to this e-Visit. If it has been greater than 24 hours you will need to   follow up with your provider, or enter a new e-Visit to address those concerns. You will get an e-mail in the next two days asking about your experience.  I hope that your e-visit has been valuable and will speed your recovery. Thank you for using e-visits.   5-10 minutes spent reviewing and documenting in chart.  

## 2019-11-08 ENCOUNTER — Encounter: Payer: Self-pay | Admitting: Family Medicine

## 2019-11-08 ENCOUNTER — Other Ambulatory Visit: Payer: Self-pay

## 2019-11-08 DIAGNOSIS — G43109 Migraine with aura, not intractable, without status migrainosus: Secondary | ICD-10-CM

## 2019-11-12 ENCOUNTER — Telehealth: Payer: Self-pay

## 2019-11-12 ENCOUNTER — Encounter: Payer: Self-pay | Admitting: Family Medicine

## 2019-11-12 NOTE — Telephone Encounter (Signed)
Alison Nichols from [facility] calling for notes.Patient Scheduled for Consultation with Dr. Maisie Fus  11/15/2019  Phone Number [phone] Attention Alison Nichols Fax number [phone]

## 2019-11-12 NOTE — Telephone Encounter (Signed)
Im not sure what this is for. I sent a my chart message to patient.  Orland Mustard, MD [facility]

## 2019-11-12 NOTE — Telephone Encounter (Signed)
Please Advise

## 2019-11-15 MED FILL — SUMATRIPTAN SUCC 100 MG TAB: 100 | 30 days supply | Qty: 9 | Fill #1

## 2019-11-22 ENCOUNTER — Other Ambulatory Visit: Payer: Self-pay | Admitting: Family Medicine

## 2019-11-22 MED ORDER — AMPHETAMINE-DEXTROAMPHET ER 30 MG PO CP24: 30.0000 mg | ORAL_CAPSULE | Freq: Every day | ORAL | 0 refills | Status: DC

## 2019-11-22 MED FILL — ADDERALL XR 30 MG CAP SA: 30 | 30 days supply | Qty: 30 | Fill #0

## 2019-11-22 NOTE — Telephone Encounter (Signed)
Pt requesting Adderall 30 mg tab  LOV: 09/10/2019 Upcoming Visit: 12/20/19  Approve?

## 2019-11-26 ENCOUNTER — Other Ambulatory Visit: Payer: Self-pay | Admitting: Family Medicine

## 2019-11-26 MED FILL — BACLOFEN 10 MG TABS: 10 | 30 days supply | Qty: 90 | Fill #0

## 2019-11-29 ENCOUNTER — Telehealth: Payer: No Typology Code available for payment source | Admitting: Physician Assistant

## 2019-11-29 DIAGNOSIS — R05 Cough: Secondary | ICD-10-CM

## 2019-11-29 DIAGNOSIS — R059 Cough, unspecified: Secondary | ICD-10-CM

## 2019-11-29 DIAGNOSIS — J302 Other seasonal allergic rhinitis: Secondary | ICD-10-CM | POA: Diagnosis not present

## 2019-11-29 MED ORDER — DOXYCYCLINE HYCLATE 100 MG PO TABS: 100.0000 mg | ORAL_TABLET | Freq: Two times a day (BID) | ORAL | 0 refills | Status: DC

## 2019-11-29 MED ORDER — FLUTICASONE PROPIONATE 50 MCG/ACT NA SUSP: 2.0000 | Freq: Every day | NASAL | 0 refills | Status: DC

## 2019-11-29 MED ORDER — PREDNISONE 50 MG PO TABS: 50.0000 mg | ORAL_TABLET | Freq: Every day | ORAL | 0 refills | Status: AC

## 2019-11-29 MED ORDER — BENZONATATE 100 MG PO CAPS: 100.0000 mg | ORAL_CAPSULE | Freq: Three times a day (TID) | ORAL | 0 refills | Status: AC

## 2019-11-29 MED FILL — FLUTICASONE PROP 50 MCG SPR: 50 | 30 days supply | Qty: 16 | Fill #0

## 2019-11-29 MED FILL — DOXYCYCLINE HYCLATE 100 MG: 100 | 10 days supply | Qty: 20 | Fill #0

## 2019-11-29 MED FILL — BENZONATATE 100 MG CAPS: 100 | 5 days supply | Qty: 15 | Fill #0

## 2019-11-29 MED FILL — predniSONE 50 MG TABS: 50 | 5 days supply | Qty: 5 | Fill #0

## 2019-11-29 NOTE — Progress Notes (Signed)
We are sorry that you are not feeling well.  Here is how we plan to help!  Based on your presentation I believe you most likely have A cough due to bacteria.  When patients have a fever and a productive cough with a change in color or increased sputum production, we are concerned about bacterial bronchitis.  If left untreated it can progress to pneumonia.  If your symptoms do not improve with your treatment plan it is important that you contact your provider.   I have prescribed Doxycycline 100 mg twice a day for 7 days     In addition you may use A prescription cough medication called Tessalon Perles 100mg . You may take 1-2 capsules every 8 hours as needed for your cough.  I have also given a prescription for Prednisone 50 mg to take daily for the next 5 days and Fluticasone nasal spray to help with your nasal congestion.  From your responses in the eVisit questionnaire you describe inflammation in the upper respiratory tract which is causing a significant cough.  This is commonly called Bronchitis and has four common causes:    Allergies  Viral Infections  Acid Reflux  Bacterial Infection Allergies, viruses and acid reflux are treated by controlling symptoms or eliminating the cause. An example might be a cough caused by taking certain blood pressure medications. You stop the cough by changing the medication. Another example might be a cough caused by acid reflux. Controlling the reflux helps control the cough.  USE OF BRONCHODILATOR ("RESCUE") INHALERS: There is a risk from using your bronchodilator too frequently.  The risk is that over-reliance on a medication which only relaxes the muscles surrounding the breathing tubes can reduce the effectiveness of medications prescribed to reduce swelling and congestion of the tubes themselves.  Although you feel brief relief from the bronchodilator inhaler, your asthma may actually be worsening with the tubes becoming more swollen and filled with  mucus.  This can delay other crucial treatments, such as oral steroid medications. If you need to use a bronchodilator inhaler daily, several times per day, you should discuss this with your provider.  There are probably better treatments that could be used to keep your asthma under control.     HOME CARE . Only take medications as instructed by your medical team. . Complete the entire course of an antibiotic. . Drink plenty of fluids and get plenty of rest. . Avoid close contacts especially the very young and the elderly . Cover your mouth if you cough or cough into your sleeve. . Always remember to wash your hands . A steam or ultrasonic humidifier can help congestion.   GET HELP RIGHT AWAY IF: . You develop worsening fever. . You become short of breath . You cough up blood. . Your symptoms persist after you have completed your treatment plan MAKE SURE YOU   Understand these instructions.  Will watch your condition.  Will get help right away if you are not doing well or get worse.  Your e-visit answers were reviewed by a board certified advanced clinical practitioner to complete your personal care plan.  Depending on the condition, your plan could have included both over the counter or prescription medications. If there is a problem please reply  once you have received a response from your provider. Your safety is important to [place].  If you have drug allergies check your prescription carefully.    You can use MyChart to ask questions about today's visit, request  a non-urgent call back, or ask for a work or school excuse for 24 hours related to this e-Visit. If it has been greater than 24 hours you will need to follow up with your provider, or enter a new e-Visit to address those concerns. You will get an e-mail in the next two days asking about your experience.  I hope that your e-visit has been valuable and will speed your recovery. Thank you for using e-visits.  Approximately 5  minutes was spent documenting and reviewing patient's chart.

## 2019-12-06 ENCOUNTER — Encounter: Payer: Self-pay | Admitting: Family Medicine

## 2019-12-06 ENCOUNTER — Other Ambulatory Visit: Payer: Self-pay | Admitting: Family Medicine

## 2019-12-06 MED ORDER — SERTRALINE HCL 100 MG PO TABS: 100.0000 mg | ORAL_TABLET | Freq: Every day | ORAL | 1 refills | Status: DC

## 2019-12-06 MED FILL — SERTRALINE HCL 100 MG TAB: 100 | 90 days supply | Qty: 90 | Fill #0

## 2019-12-14 MED FILL — SUMAtriptan SUCCINATE 100 M: 100 | 30 days supply | Qty: 9 | Fill #0

## 2019-12-20 ENCOUNTER — Ambulatory Visit (INDEPENDENT_AMBULATORY_CARE_PROVIDER_SITE_OTHER): Payer: No Typology Code available for payment source | Admitting: Family Medicine

## 2019-12-20 ENCOUNTER — Encounter: Payer: Self-pay | Admitting: Family Medicine

## 2019-12-20 ENCOUNTER — Other Ambulatory Visit: Payer: Self-pay

## 2019-12-20 ENCOUNTER — Telehealth: Payer: Self-pay | Admitting: Radiology

## 2019-12-20 VITALS — BP 110/68 | HR 98 | Temp 98.4°F | Ht 67.0 in | Wt 150.2 lb

## 2019-12-20 DIAGNOSIS — R002 Palpitations: Secondary | ICD-10-CM | POA: Diagnosis not present

## 2019-12-20 DIAGNOSIS — Q796 Ehlers-Danlos syndrome, unspecified: Secondary | ICD-10-CM

## 2019-12-20 DIAGNOSIS — F900 Attention-deficit hyperactivity disorder, predominantly inattentive type: Secondary | ICD-10-CM

## 2019-12-20 NOTE — Telephone Encounter (Signed)
Enrolled patient for a 14 day Zio AT monitor to be mailed to patients home.  

## 2019-12-20 NOTE — Patient Instructions (Signed)
1) referral to dr. Graciela Husbands for r/o POTS 2) heart monitor/echo-they will call you with this.  3) stop adderall for a week b/c they will want to make sure not related to stimulant.

## 2019-12-20 NOTE — Progress Notes (Signed)
Patient: Alison Nichols MRN: [id number] DOB: 09-05-1985 PCP: Orma Flaming, MD     Subjective:  Chief Complaint  Patient presents with  . Medication Problem    Adderrall  . Palpitations    HPI: The patient is a 34 y.o. female who presents today for complaints of palpitations. She is currently on Adderall XR 30mg  daily.  She has bouts of tachycardia that lasts 5-10 minutes. She does feel better when she sits down. Her heart rate will get up to 160. She has EDS and her specialist wonders if she has POTS. She seems to get the fast heart rate when she is standing for long periods of time. She has never passed out. She still has this even when she doesn't take her adderall. Does not feel irregular. Can have shortness of breath but no other associated symptoms.   -she is not on any decongestants. Does drink caffeine, but a limited amount.   She also feels like she has temperature dysregulation.    Review of Systems  Constitutional: Positive for fatigue. Negative for chills, diaphoresis and fever.  Respiratory: Positive for shortness of breath. Negative for cough and wheezing.   Cardiovascular: Positive for palpitations. Negative for chest pain and leg swelling.  Gastrointestinal: Negative for abdominal pain, diarrhea, nausea and vomiting.  Skin: Negative for rash.  Neurological: Negative for dizziness, light-headedness and headaches.  Psychiatric/Behavioral: Negative for agitation. The patient is not nervous/anxious.     Allergies Patient is allergic to cephalexin.  Past Medical History Patient  has a past medical history of Anxiety, Asthma, ASTHMA (01/27/2008), Depression, Ehlers-Danlos disease, Headache, Hypertension affecting pregnancy (2019), IBS (irritable bowel syndrome), Migraines, Scoliosis, and Vaginal Pap smear, abnormal.  Surgical History Patient  has a past surgical history that includes Cholecystectomy (2002); Hysteroscopy with D & C (2004); Knee arthroscopy (1999 &  2000); Tonsillectomy; Colposcopy; and Combined hysteroscopy diagnostic / D&C (2004).  Family History Pateint's family history includes Arthritis in her maternal grandmother; Depression in her mother; Heart attack in her maternal grandmother; Heart failure in her maternal grandmother; Hyperlipidemia in her mother; Hypertension in her maternal grandfather, maternal grandmother, and mother; Kidney disease in her maternal grandfather; Thyroid disease in her maternal grandmother.  Social History Patient  reports that she has never smoked. She has never used smokeless tobacco. She reports that she does not drink alcohol or use drugs.    Objective: Vitals:   12/20/19 1416  BP: 110/68  Pulse: 98  Temp: 98.4 F (36.9 C)  TempSrc: Temporal  SpO2: 98%  Weight: 150 lb 3.2 oz (68.1 kg)  Height: 5\' 7"  (1.702 m)    Body mass index is 23.52 kg/m.  Physical Exam Vitals reviewed.  Constitutional:      Appearance: Normal appearance. She is well-developed and normal weight.  HENT:     Head: Normocephalic and atraumatic.     Right Ear: Tympanic membrane, ear canal and external ear normal.     Left Ear: Tympanic membrane, ear canal and external ear normal.  Eyes:     Extraocular Movements: Extraocular movements intact.     Conjunctiva/sclera: Conjunctivae normal.     Pupils: Pupils are equal, round, and reactive to light.  Neck:     Thyroid: No thyromegaly.  Cardiovascular:     Rate and Rhythm: Normal rate and regular rhythm.     Pulses: Normal pulses.     Heart sounds: Normal heart sounds. No murmur.  Pulmonary:     Effort: Pulmonary effort is normal.  Breath sounds: Normal breath sounds.  Abdominal:     General: Abdomen is flat. Bowel sounds are normal. There is no distension.     Palpations: Abdomen is soft.     Tenderness: There is no abdominal tenderness.  Musculoskeletal:     Cervical back: Normal range of motion and neck supple.  Lymphadenopathy:     Cervical: No cervical  adenopathy.  Skin:    General: Skin is warm and dry.     Capillary Refill: Capillary refill takes less than 2 seconds.     Findings: No rash.  Neurological:     General: No focal deficit present.     Mental Status: She is alert and oriented to person, place, and time.     Cranial Nerves: No cranial nerve deficit.     Coordination: Coordination normal.     Deep Tendon Reflexes: Reflexes normal.  Psychiatric:        Mood and Affect: Mood normal.        Behavior: Behavior normal.    Ekg: nsr with rate of 87    Assessment/plan: 1. Palpitations TSH recently checked and wnl. ? If EDS related to POTS vs. SVT vs. Adderall. Vs. COVID long hauler. Requesting she hold her adderall x 1 week and keep a log. We will also check a zio and echo and do referral to Dr. Graciela Husbands to rule out pots.  - EKG 12-Lead - LONG TERM MONITOR-LIVE TELEMETRY (3-14 DAYS); Future - Ambulatory referral to Cardiology - ECHOCARDIOGRAM COMPLETE; Future  2. Ehlers-Danlos syndrome  - ECHOCARDIOGRAM COMPLETE; Future  3. ADHD, predominantly inattentive type Hold medication x 1 week, but even when she has been off medication she still has symptoms.      This visit occurred during the SARS-CoV-2 public health emergency.  Safety protocols were in place, including screening questions prior to the visit, additional usage of staff PPE, and extensive cleaning of exam room while observing appropriate contact time as indicated for disinfecting solutions.    Return if symptoms worsen or fail to improve.    Orland Mustard, MD [facility]   12/20/2019

## 2019-12-21 ENCOUNTER — Telehealth: Payer: Self-pay | Admitting: Interventional Cardiology

## 2019-12-21 NOTE — Telephone Encounter (Signed)
New Message   Katie with [employer] called to obtain insurance information.

## 2019-12-25 ENCOUNTER — Encounter (INDEPENDENT_AMBULATORY_CARE_PROVIDER_SITE_OTHER): Payer: No Typology Code available for payment source

## 2019-12-25 DIAGNOSIS — R002 Palpitations: Secondary | ICD-10-CM

## 2019-12-27 ENCOUNTER — Other Ambulatory Visit: Payer: Self-pay

## 2019-12-27 ENCOUNTER — Ambulatory Visit: Payer: No Typology Code available for payment source | Admitting: Neurology

## 2019-12-27 ENCOUNTER — Encounter: Payer: Self-pay | Admitting: Neurology

## 2019-12-27 VITALS — BP 134/90 | HR 88 | Temp 98.1°F | Ht 67.0 in | Wt 149.0 lb

## 2019-12-27 DIAGNOSIS — M7918 Myalgia, other site: Secondary | ICD-10-CM

## 2019-12-27 DIAGNOSIS — R519 Headache, unspecified: Secondary | ICD-10-CM | POA: Diagnosis not present

## 2019-12-27 DIAGNOSIS — G43109 Migraine with aura, not intractable, without status migrainosus: Secondary | ICD-10-CM

## 2019-12-27 DIAGNOSIS — G43709 Chronic migraine without aura, not intractable, without status migrainosus: Secondary | ICD-10-CM | POA: Diagnosis not present

## 2019-12-27 DIAGNOSIS — R51 Headache with orthostatic component, not elsewhere classified: Secondary | ICD-10-CM

## 2019-12-27 DIAGNOSIS — H539 Unspecified visual disturbance: Secondary | ICD-10-CM

## 2019-12-27 NOTE — Patient Instructions (Signed)
MRI of the brain Dry Needling ([facility] or [facility]) Botox approval  Fremanezumab injection What is this medicine? FREMANEZUMAB (fre ma NEZ ue mab) is used to prevent migraine headaches. This medicine may be used for other purposes; ask your health care provider or pharmacist if you have questions. COMMON BRAND NAME(S): AJOVY What should I tell my health care provider before I take this medicine? They need to know if you have any of these conditions:  an unusual or allergic reaction to fremanezumab, other medicines, foods, dyes, or preservatives  pregnant or trying to get pregnant  breast-feeding How should I use this medicine? This medicine is for injection under the skin. You will be taught how to prepare and give this medicine. Use exactly as directed. Take your medicine at regular intervals. Do not take your medicine more often than directed. It is important that you put your used needles and syringes in a special sharps container. Do not put them in a trash can. If you do not have a sharps container, call your pharmacist or healthcare provider to get one. Talk to your pediatrician regarding the use of this medicine in children. Special care may be needed. Overdosage: If you think you have taken too much of this medicine contact a poison control center or emergency room at once. NOTE: This medicine is only for you. Do not share this medicine with others. What if I miss a dose? If you miss a dose, take it as soon as you can. If it is almost time for your next dose, take only that dose. Do not take double or extra doses. What may interact with this medicine? Interactions are not expected. This list may not describe all possible interactions. Give your health care provider a list of all the medicines, herbs, non-prescription drugs, or dietary supplements you use. Also tell them if you smoke, drink alcohol, or use illegal drugs. Some items may interact with your medicine. What should I  watch for while using this medicine? Tell your doctor or healthcare professional if your symptoms do not start to get better or if they get worse. What side effects may I notice from receiving this medicine? Side effects that you should report to your doctor or health care professional as soon as possible:  allergic reactions like skin rash, itching or hives, swelling of the face, lips, or tongue Side effects that usually do not require medical attention (report these to your doctor or health care professional if they continue or are bothersome):  pain, redness, or irritation at site where injected This list may not describe all possible side effects. Call your doctor for medical advice about side effects. You may report side effects to FDA at 1-800-FDA-1088. Where should I keep my medicine? Keep out of the reach of children. You will be instructed on how to store this medicine. Throw away any unused medicine after the expiration date on the label. NOTE: This sheet is a summary. It may not cover all possible information. If you have questions about this medicine, talk to your doctor, pharmacist, or health care provider.  2020 Elsevier/Gold Standard (2017-05-09 17:22:56)

## 2019-12-27 NOTE — Progress Notes (Signed)
[facility]    Provider:  Dr Lucia Gaskins Requesting Provider: Orland Mustard, MD Primary Care Provider:  Orland Mustard, MD  CC:  migraines  HPI:  Alison Nichols is a 34 y.o. female here as requested by Orland Mustard, MD for migraines. PMHx chiari malformation.Started around the age of 61 they were severe and sent her to the ED vomiting, she was on several medications, they improved in her early 4s and then started worsening a few years ago and continue to worsen. They are most often during menses but also throughout the month. Migraines are on the right, she has a lot of neck pain and starts there, they are pulsating/pounding/throbbing, photo/phono nausea vomiting, movement makes them worse and they last 24-72 hours, no medication overuse,  15 migraine days a month, 20 headache days a month for > 1 year that are moderately severe or severe and affecting her life significantly. She tried continuous birth control but anything she tries makes the migraines worse even progesterone only IUD. Her headaches were better with her first 2 pregnanies. Blurry vision with the migraines. She does not have auras except rarely, she has morning headaches, bending over makes her headaches much worse. She wakes often with them and once she gets up they may get better. They can last 24-72 hours. Imitrex helps. The migraines are moderately severe to severe and affects her life. Sinuses are not affecting her, she reports no sinus issues. No other focal neurologic deficits, associated symptoms, inciting events or modifiable factors.  Medications tried: Topamax, gabapentin, amitriptyline, propranolol or any other blood pressure medication contraindicated due to hypotension  Reviewed notes, labs and imaging from outside physicians, which showed:  CMP unremarkable (slighty low Na 133 stable), tsh normal 07/27/2019  CT Maxillofacial reviewed images and agree with the following:  Clinical data:  Sinusitis and cough.  LIMITED CT SCAN OF THE PARANASAL SINUSES WITHOUT CONTRAST:  Scans were performed in the axial plane through the paranasal sinuses. The paranasal sinuses are well visualized and there is no significant opacification. There is some very minimal mucosal thickening and a few tiny ethmoid air cells, but there are no air fluid levels or significant mucosal thickening. There is no significant nasal septal deviation. The mastoid air cells and middle ear cavities are clear.   IMPRESSION:  Essentially normal exam of the paranasal sinuses  Review of Systems: Patient complains of symptoms per HPI as well as the following symptoms: headache. Pertinent negatives and positives per HPI. All others negative.   Social History   Socioeconomic History  . Marital status: Married    Spouse name: Not on file  . Number of children: 3  . Years of education: Not on file  . Highest education level: Bachelor's degree (e.g., BA, AB, BS)  Occupational History  . Not on file  Tobacco Use  . Smoking status: Never Smoker  . Smokeless tobacco: Never Used  Substance and Sexual Activity  . Alcohol use: Never  . Drug use: Never  . Sexual activity: Yes    Birth control/protection: I.U.D.  Other Topics Concern  . Not on file  Social History Narrative   Lives at home with husband & 3 children   Right handed   Caffeine: 2 cups per day, diet coke   Social Determinants of Health   Financial Resource Strain:   . Difficulty of Paying Living Expenses:   Food Insecurity:   . Worried About Programme researcher, broadcasting/film/video in the Last Year:   . Ran  Out of Food in the Last Year:   Transportation Needs:   . Lack of Transportation (Medical):   Marland Kitchen Lack of Transportation (Non-Medical):   Physical Activity:   . Days of Exercise per Week:   . Minutes of Exercise per Session:   Stress:   . Feeling of Stress :   Social Connections:   . Frequency of Communication with Friends and Family:   . Frequency of Social  Gatherings with Friends and Family:   . Attends Religious Services:   . Active Member of Clubs or Organizations:   . Attends Archivist Meetings:   Marland Kitchen Marital Status:   Intimate Partner Violence:   . Fear of Current or Ex-Partner:   . Emotionally Abused:   Marland Kitchen Physically Abused:   . Sexually Abused:     Family History  Problem Relation Age of Onset  . Heart failure Maternal Grandmother   . Hypertension Maternal Grandmother   . Thyroid disease Maternal Grandmother   . Arthritis Maternal Grandmother   . Heart attack Maternal Grandmother   . CAD Maternal Grandmother   . Migraines Maternal Grandmother   . Depression Mother   . Hyperlipidemia Mother   . Hypertension Mother   . Migraines Mother   . Hypertension Maternal Grandfather   . Kidney disease Maternal Grandfather   . Epilepsy Maternal Grandfather     Past Medical History:  Diagnosis Date  . Anxiety   . Asthma   . ASTHMA 01/27/2008   Qualifier: Diagnosis of  By: Jerral Ralph    . Depression   . Ehlers-Danlos disease   . Headache   . Hypertension affecting pregnancy 2019  . IBS (irritable bowel syndrome)   . Migraines   . Scoliosis   . Vaginal Pap smear, abnormal     Patient Active Problem List   Diagnosis Date Noted  . Migraine with aura and without status migrainosus, not intractable 12/31/2019  . Vitamin D deficiency 07/30/2019  . 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 Anxiety 03/13/2018  . Gestational hypertension 02/21/2018  . EHLERS-DANLOS SYNDROME 09/01/2009  . Mild asthma without complication 46/27/0350  . GERD 01/27/2008  . Chronic migraine without aura without status migrainosus, not intractable 01/27/2008  . HEMORRHOIDS, EXTERNAL 02/18/2006    Past Surgical History:  Procedure Laterality Date  . CHOLECYSTECTOMY  2002  . COLPOSCOPY    . COMBINED HYSTEROSCOPY DIAGNOSTIC / D&C  2004  . HYSTEROSCOPY WITH D & C  2004  . KNEE ARTHROSCOPY  [place]  . TONSILLECTOMY      w/ Adenoidectomy    Current Outpatient Medications  Medication Sig Dispense Refill  . amphetamine-dextroamphetamine (ADDERALL XR) 30 MG 24 hr capsule Take 1 capsule (30 mg total) by mouth daily. 30 capsule 0  . baclofen (LIORESAL) 10 MG tablet TAKE 1 TABLET BY MOUTH 3 TIMES DAILY 90 tablet 1  . fluticasone (FLONASE) 50 MCG/ACT nasal spray Place 2 sprays into both nostrils daily. 16 g 0  . hydrOXYzine (ATARAX/VISTARIL) 25 MG tablet Take 1 tablet (25 mg total) by mouth at bedtime as needed for itching. 90 tablet 1  . meloxicam (MOBIC) 7.5 MG tablet Take 1 tablet (7.5 mg total) by mouth 2 (two) times daily as needed for pain. 30 tablet 2  . sertraline (ZOLOFT) 100 MG tablet Take 1 tablet (100 mg total) by mouth daily. 90 tablet 1  . SUMAtriptan (IMITREX) 100 MG tablet Take 100mg  by mouth at onset of headache. May repeat in 2 hours if  headache persists or recurs. No more than 2/24 hours. 10 tablet 3   No current facility-administered medications for this visit.    Allergies as of 12/27/2019 - Review Complete 12/27/2019  Allergen Reaction Noted  . Cephalexin Hives 09/01/2009    Vitals: BP 134/90 (BP Location: Left Arm, Patient Position: Sitting)   Pulse 88   Temp 98.1 F (36.7 C) Comment: taken at front  Ht 5\' 7"  (1.702 m)   Wt 149 lb (67.6 kg)   Breastfeeding No   BMI 23.34 kg/m  Last Weight:  Wt Readings from Last 1 Encounters:  12/27/19 149 lb (67.6 kg)   Last Height:   Ht Readings from Last 1 Encounters:  12/27/19 5\' 7"  (1.702 m)     Physical exam: Exam: Gen: NAD, conversant, well nourised, well groomed                     CV: RRR, no MRG. No Carotid Bruits. No peripheral edema, warm, nontender Eyes: Conjunctivae clear without exudates or hemorrhage  Neuro: Detailed Neurologic Exam  Speech:    Speech is normal; fluent and spontaneous with normal comprehension.  Cognition:    The patient is oriented to person, place, and time;     recent and remote memory  intact;     language fluent;     normal attention, concentration,     fund of knowledge Cranial Nerves:    The pupils are equal, round, and reactive to light. The fundi are flat. Visual fields are full to finger confrontation. Extraocular movements are intact. Trigeminal sensation is intact and the muscles of mastication are normal. The face is symmetric. The palate elevates in the midline. Hearing intact. Voice is normal. Shoulder shrug is normal. The tongue has normal motion without fasciculations.   Coordination:    No dysmetria  Gait:    Normal native gait  Motor Observation:    No asymmetry, no atrophy, and no involuntary movements noted. Tone:    Normal muscle tone.    Posture:    Posture is normal. normal erect    Strength:    Strength is V/V in the upper and lower limbs.      Sensation: intact to LT     Reflex Exam:  DTR's:    Deep tendon reflexes in the upper and lower extremities are normal bilaterally.   Toes:    The toes are downgoing bilaterally.   Clonus:    Clonus is absent.    Assessment/Plan:  34 year old with chronic migraines without aura also rarely with aura. Still given concerning symptoms she needs an MRI of the brain, she also has a hx of chiari malformation and this needs to be monitored.   MRI of the brain: MRI brain due to concerning symptoms of morning headaches, positional headaches,vision changes  to look for space occupying mass,worsening  chiari or intracranial hypertension (pseudotumor).  Dry needling: [facility] or PT referral for dry needling [facility]: Evaluate and treat for cervical myofascial pain syndrome and migraines including Dry Needling Start Botox approval Discussed Ajovy if needed in the future.  Discuss acute management at next appointment  Orders Placed This Encounter  Procedures  . MR BRAIN W WO CONTRAST  . Ambulatory referral to Physical Therapy    Cc: , MD  31, MD  [facility] [address] [place], [address] [place]  Phone [phone] Fax [phone]

## 2019-12-31 ENCOUNTER — Encounter: Payer: Self-pay | Admitting: Neurology

## 2019-12-31 ENCOUNTER — Telehealth: Payer: Self-pay | Admitting: Neurology

## 2019-12-31 DIAGNOSIS — G43109 Migraine with aura, not intractable, without status migrainosus: Secondary | ICD-10-CM | POA: Insufficient documentation

## 2019-12-31 NOTE — Telephone Encounter (Signed)
Dana: Can you please start the approval process for botox for migraine please and call and discuss with patient and schedule appointment. Thank you

## 2020-01-01 ENCOUNTER — Other Ambulatory Visit: Payer: Self-pay | Admitting: Family Medicine

## 2020-01-01 NOTE — Telephone Encounter (Signed)
Rx Request 

## 2020-01-01 NOTE — Telephone Encounter (Signed)
Please start botox with Amy NP per Dr. Lucia Gaskins. Charge sheet completed and signed by Dr. Lucia Gaskins.

## 2020-01-01 NOTE — Telephone Encounter (Cosign Needed)
Noted  

## 2020-01-02 MED ORDER — AMPHETAMINE-DEXTROAMPHET ER 30 MG PO CP24: 30.0000 mg | ORAL_CAPSULE | Freq: Every day | ORAL | 0 refills | Status: DC

## 2020-01-07 NOTE — Telephone Encounter (Signed)
I called [facility] [phone]) to begin PA for patient. [facility] informed me that they do not have a referral on file for the original office visit, and that patient would need to be called and told to put a referral in her account which is member responsibility. We can not proceed any further with Botox PA until this is done by patient. I called patient and informed her of this. She states that she will get this taken care of and call us back.

## 2020-01-07 NOTE — Telephone Encounter (Signed)
I called patient attempting to schedule Botox appointment. LVM requesting she call back to schedule.

## 2020-01-08 ENCOUNTER — Telehealth: Payer: Self-pay | Admitting: Neurology

## 2020-01-08 ENCOUNTER — Other Ambulatory Visit: Payer: Self-pay

## 2020-01-08 ENCOUNTER — Ambulatory Visit (HOSPITAL_COMMUNITY): Payer: No Typology Code available for payment source | Attending: Cardiovascular Disease

## 2020-01-08 ENCOUNTER — Telehealth: Payer: Self-pay

## 2020-01-08 DIAGNOSIS — R002 Palpitations: Secondary | ICD-10-CM | POA: Diagnosis present

## 2020-01-08 DIAGNOSIS — Q796 Ehlers-Danlos syndrome, unspecified: Secondary | ICD-10-CM

## 2020-01-08 NOTE — Telephone Encounter (Signed)
Spoke to patient. The patient has been rescheduled to see Dr. Graciela Husbands on 8/10, his first available POTS slot. Made patient aware that I will send to his scheduler to let her know if a sooner appointment becomes available. She will let us know if her Sx change or worsen before then.

## 2020-01-08 NOTE — Telephone Encounter (Signed)
Follow up    Pt is returning call   Transferred to [place]

## 2020-01-08 NOTE — Telephone Encounter (Signed)
LVM for pt to call back about scheduling mri  [facility] auth: [id number] (exp, 01/15/20 to 02/14/20)

## 2020-01-08 NOTE — Telephone Encounter (Signed)
Called and left a message for patient to call back.   Patient is scheduled as a new patient with Dr. Eldridge Dace on 6/11. Per Dr. Eldridge Dace, he has spoken with Dr. Graciela Husbands and the patient's appointment with Dr. Eldridge Dace should be cancelled. The patient should be scheduled to see Dr. Graciela Husbands instead in one of his POTS spots.

## 2020-01-09 ENCOUNTER — Encounter: Payer: Self-pay | Admitting: Family Medicine

## 2020-01-10 NOTE — Telephone Encounter (Signed)
I called patient to follow-up on the referral she needed to add to her account with [facility]. She states she added it in and received confirmation that [facility] had processed it. I will begin PA for Botox.

## 2020-01-16 ENCOUNTER — Telehealth: Payer: Self-pay | Admitting: General Practice

## 2020-01-16 ENCOUNTER — Telehealth: Payer: Self-pay | Admitting: Family Medicine

## 2020-01-16 ENCOUNTER — Encounter: Payer: Self-pay | Admitting: Family Medicine

## 2020-01-16 NOTE — Telephone Encounter (Signed)
Hi Angeline- I ordered the ziopatch, but a cardiologist always reads this. I order these all of the time. I do not read them so please have someone follow up on this. I have never had an issue with it.  Thank you Dr. Artis Flock

## 2020-01-16 NOTE — Telephone Encounter (Signed)
   Went to chart to check zio monitor, advised since she has not yet seen in our practice its the ordering provider will read her result. Pt understood and will call pcp

## 2020-01-22 NOTE — Telephone Encounter (Signed)
I spoke to the patient she is scheduled at [facility] for 01/30/20. 

## 2020-01-30 ENCOUNTER — Ambulatory Visit: Payer: No Typology Code available for payment source

## 2020-01-30 ENCOUNTER — Other Ambulatory Visit: Payer: Self-pay

## 2020-01-30 DIAGNOSIS — R51 Headache with orthostatic component, not elsewhere classified: Secondary | ICD-10-CM | POA: Diagnosis not present

## 2020-01-30 DIAGNOSIS — R519 Headache, unspecified: Secondary | ICD-10-CM | POA: Diagnosis not present

## 2020-01-30 DIAGNOSIS — H539 Unspecified visual disturbance: Secondary | ICD-10-CM

## 2020-01-30 MED ORDER — GADOBENATE DIMEGLUMINE 529 MG/ML IV SOLN
15.0000 mL | Freq: Once | INTRAVENOUS | Status: AC | PRN
  Administered 2020-01-30: 15 mL via INTRAVENOUS

## 2020-01-31 ENCOUNTER — Other Ambulatory Visit: Payer: Self-pay | Admitting: Family Medicine

## 2020-02-01 ENCOUNTER — Ambulatory Visit: Payer: No Typology Code available for payment source | Admitting: Interventional Cardiology

## 2020-02-01 MED ORDER — AMPHETAMINE-DEXTROAMPHET ER 30 MG PO CP24: 30.0000 mg | ORAL_CAPSULE | Freq: Every day | ORAL | 0 refills | Status: DC

## 2020-02-01 NOTE — Telephone Encounter (Signed)
[place] Database Verified LR: 01-02-2020 Qty: 30 Last office visit: 12-20-2019 Upcoming appointment: No pending appt

## 2020-02-04 ENCOUNTER — Other Ambulatory Visit: Payer: Self-pay

## 2020-02-04 ENCOUNTER — Ambulatory Visit: Payer: No Typology Code available for payment source | Admitting: Internal Medicine

## 2020-02-04 ENCOUNTER — Encounter: Payer: Self-pay | Admitting: Internal Medicine

## 2020-02-04 VITALS — BP 128/80 | HR 88 | Ht 67.0 in | Wt 156.6 lb

## 2020-02-04 DIAGNOSIS — G901 Familial dysautonomia [Riley-Day]: Secondary | ICD-10-CM | POA: Diagnosis not present

## 2020-02-04 MED FILL — SUMATRIPTAN SUCC 100 MG TAB: 100 | 30 days supply | Qty: 9 | Fill #0

## 2020-02-04 NOTE — Progress Notes (Signed)
ELECTROPHYSIOLOGY CONSULT NOTE  Patient ID: Alison Nichols, MRN: [id number], DOB/AGE: Jul 14, 1986 34 y.o. Admit date: (Not on file) Date of Consult: 02/04/2020  Primary Physician: Orland Mustard, MD Primary Cardiologist:        SHIRELL STRUTHERS is a 34 y.o. female who is being seen today for the evaluation of tachypalpiations at the request of Dr Artis Flock.    HPI Alison Nichols is a 34 y.o. female  Referred for palpiations   She has a short stay nurse.  She has had longstanding palpitations in the context of Ehlers-Danlos syndrome and long term use of Adderall.  She has had increasing problems over the last couple of years with worsening heat intolerance, exercise intolerance shower intolerance and menses intolerance.  She is no longer been considered eligible for birth control containing estrogens because of her migraine headaches.  Poor exercise tolerance.   Diet is fluid and salt replete.  She is struggled with anxiety and has been a long-term sertraline  No alcohol or recreational drug use  DATE TEST EF   5/21 Echo  60-65 %         Date Cr K Hgb  12/20 0.77 4.0 15.9 (<<10.4 **7/19)         Event Recorder personnally reviewed  HR avg 89 Min 51-Max 164  PVCs <1% PACs <1% Symptoms Lightheadedness >> sinus rhythm Pounding >> sinus rhythm  Skipped beats>> ventricular couplet Triggered without symptoms >> sinus and isolated PVCs   Past Medical History:  Diagnosis Date   Anxiety    Asthma    ASTHMA 01/27/2008   Qualifier: Diagnosis of  By: Thereasa Solo     Depression    Ehlers-Danlos disease    Headache    Hypertension affecting pregnancy 2019   IBS (irritable bowel syndrome)    Migraines    Scoliosis    Vaginal Pap smear, abnormal       Surgical History:  Past Surgical History:  Procedure Laterality Date   CHOLECYSTECTOMY  2002   COLPOSCOPY     COMBINED HYSTEROSCOPY DIAGNOSTIC / D&C  2004   HYSTEROSCOPY WITH D & C   2004   KNEE ARTHROSCOPY  1998 & 1999   TONSILLECTOMY     w/ Adenoidectomy     Home Meds: Current Meds  Medication Sig   amphetamine-dextroamphetamine (ADDERALL XR) 30 MG 24 hr capsule Take 1 capsule (30 mg total) by mouth daily.   baclofen (LIORESAL) 10 MG tablet TAKE 1 TABLET BY MOUTH 3 TIMES DAILY   fluticasone (FLONASE) 50 MCG/ACT nasal spray Place 2 sprays into both nostrils daily.   hydrOXYzine (ATARAX/VISTARIL) 25 MG tablet Take 1 tablet (25 mg total) by mouth at bedtime as needed for itching.   sertraline (ZOLOFT) 100 MG tablet Take 1 tablet (100 mg total) by mouth daily.   SUMAtriptan (IMITREX) 100 MG tablet Take 100mg  by mouth at onset of headache. May repeat in 2 hours if headache persists or recurs. No more than 2/24 hours.    Allergies:  Allergies  Allergen Reactions   Cephalexin Hives    Social History   Socioeconomic History   Marital status: Married    Spouse name: Not on file   Number of children: 3   Years of education: Not on file   Highest education level: Bachelor's degree (e.g., BA, AB, BS)  Occupational History   Not on file  Tobacco Use   Smoking status: Never Smoker   Smokeless tobacco: Never Used  Vaping Use   Vaping Use: Never used  Substance and Sexual Activity   Alcohol use: Never   Drug use: Never   Sexual activity: Yes    Birth control/protection: I.U.D.  Other Topics Concern   Not on file  Social History Narrative   Lives at home with husband & 3 children   Right handed   Caffeine: 2 cups per day, diet coke   Social Determinants of Health   Financial Resource Strain:    Difficulty of Paying Living Expenses:   Food Insecurity:    Worried About Programme researcher, broadcasting/film/videounning Out of Food in the Last Year:    Baristaan Out of Food in the Last Year:   Transportation Needs:    Freight forwarderLack of Transportation (Medical):    Lack of Transportation (Non-Medical):   Physical Activity:    Days of Exercise per Week:    Minutes of Exercise per  Session:   Stress:    Feeling of Stress :   Social Connections:    Frequency of Communication with Friends and Family:    Frequency of Social Gatherings with Friends and Family:    Attends Religious Services:    Active Member of Clubs or Organizations:    Attends Engineer, structuralClub or Organization Meetings:    Marital Status:   Intimate Partner Violence:    Fear of Current or Ex-Partner:    Emotionally Abused:    Physically Abused:    Sexually Abused:      Family History  Problem Relation Age of Onset   Heart failure Maternal Grandmother    Hypertension Maternal Grandmother    Thyroid disease Maternal Grandmother    Arthritis Maternal Grandmother    Heart attack Maternal Grandmother    CAD Maternal Grandmother    Migraines Maternal Grandmother    Depression Mother    Hyperlipidemia Mother    Hypertension Mother    Migraines Mother    Hypertension Maternal Grandfather    Kidney disease Maternal Grandfather    Epilepsy Maternal Grandfather      ROS:  Please see the history of present illness.     All other systems reviewed and negative.    Physical Exam: BP 128/80    Pulse 88    Ht 5\' 7"  (1.702 m)    Wt 156 lb 9.6 oz (71 kg)    SpO2 98%    BMI 24.53 kg/m   General: Well developed, well nourished female in no acute distress. Head: Normocephalic, atraumatic, sclera non-icteric, no xanthomas, nares are without discharge. EENT: normal  Lymph Nodes:  none Neck: Negative for carotid bruits. JVD not elevated. Back:without scoliosis kyphosis Lungs: Clear bilaterally to auscultation without wheezes, rales, or rhonchi. Breathing is unlabored. Heart: RRR with S1 S2. No  murmur . No rubs, or gallops appreciated. Abdomen: Soft, non-tender, non-distended with normoactive bowel sounds. No hepatomegaly. No rebound/guarding. No obvious abdominal masses. Msk:  Strength and tone appear normal for age. Extremities: No clubbing or cyanosis. No edema.  Distal pedal pulses are  2+ and equal bilaterally. Skin: Warm and Dry Neuro: Alert and oriented X 3. CN III-XII intact Grossly normal sensory and motor function . Psych:  Responds to questions appropriately with a normal affect.      Labs: Cardiac Enzymes No results for input(s): CKTOTAL, CKMB, TROPONINI in the last 72 hours. CBC Lab Results  Component Value Date   WBC 10.7 (H) 07/27/2019   HGB 15.9 (H) 07/27/2019   HCT 47.1 (H) 07/27/2019   MCV 91.3 07/27/2019  PLT 317.0 07/27/2019   PROTIME: No results for input(s): LABPROT, INR in the last 72 hours. Chemistry No results for input(s): NA, K, CL, CO2, BUN, CREATININE, CALCIUM, PROT, BILITOT, ALKPHOS, ALT, AST, GLUCOSE in the last 168 hours.  Invalid input(s): LABALBU Lipids No results found for: CHOL, HDL, LDLCALC, TRIG BNP No results found for: PROBNP Thyroid Function Tests: No results for input(s): TSH, T4TOTAL, T3FREE, THYROIDAB in the last 72 hours.  Invalid input(s): FREET3 Miscellaneous No results found for: DDIMER  Radiology/Studies:  MR BRAIN W WO CONTRAST  Result Date: 02/01/2020 [facility] NEUROIMAGING REPORT STUDY DATE: 01/30/20 PATIENT NAME: ALEYSHA MECKLER DOB: 03-18-1986 MRN: [id number] ORDERING CLINICIAN: Anson Fret, MD CLINICAL HISTORY: 34 year old female with headaches. EXAM: MR BRAIN W WO CONTRAST TECHNIQUE: MRI of the brain with and without contrast was obtained utilizing 5 mm axial slices with T1, T2, T2 flair, T2 star gradient echo and diffusion weighted views.  T1 sagittal, T2 coronal and postcontrast views in the axial and coronal plane were obtained. CONTRAST: 15 mL MultiHance. COMPARISON: None IMAGING SITE: [facility] [address] (1.5 Tesla MRI) FINDINGS: No abnormal lesions are seen on diffusion-weighted views to suggest acute ischemia. The cortical sulci, fissures and cisterns are normal in size and appearance. Lateral, third and fourth ventricle are normal in size and appearance.  No extra-axial fluid collections are seen. No evidence of mass effect or midline shift.  No abnormal lesions are seen on post contrast views.  On sagittal views the posterior fossa, pituitary gland and corpus callosum are unremarkable. No evidence of intracranial hemorrhage on gradient-echo views. The orbits and their contents, paranasal sinuses and calvarium are unremarkable.  Intracranial flow voids are present.   Normal MRI brain (with and without). INTERPRETING PHYSICIAN: Suanne Marker, MD Certified in Neurology, Neurophysiology and Neuroimaging [facility] [address], [address] [place], [place] [address] [phone]   ECHOCARDIOGRAM COMPLETE  Result Date: 01/08/2020    ECHOCARDIOGRAM REPORT   Patient Name:   JENIYAH MENOR Date of Exam: 01/08/2020 Medical Rec #:  [id number]          Height:       67.0 in Accession #:    [id number]         Weight:       149.0 lb Date of Birth:  01/05/1986          BSA:          1.784 m Patient Age:    33 years           BP:           110/68 mmHg Patient Gender: F                  HR:           88 bpm. Exam Location:  [address] Procedure: 2D Echo, Cardiac Doppler and Color Doppler Indications:    R00.2  History:        Patient has prior history of Echocardiogram examinations, most                 recent 07/05/2013. Arrythmias:PAC, Signs/Symptoms:Palpitations;                 Risk Factors:Ehlers Danlos syndrome.  Sonographer:    Samule Ohm RDCS Referring Phys: [id number] ALLISON WOLFE IMPRESSIONS  1. Left ventricular ejection fraction, by estimation, is 55 to 60%. The left ventricle has normal function. The left ventricle has no regional  wall motion abnormalities. Left ventricular diastolic parameters were normal.  2. Right ventricular systolic function is normal. The right ventricular size is normal. There is normal pulmonary artery systolic pressure.  3. The mitral valve is normal in structure. Trivial mitral valve regurgitation. No  evidence of mitral stenosis.  4. The aortic valve is tricuspid. Aortic valve regurgitation is not visualized. No aortic stenosis is present.  5. The inferior vena cava is normal in size with greater than 50% respiratory variability, suggesting right atrial pressure of 3 mmHg. FINDINGS  Left Ventricle: Left ventricular ejection fraction, by estimation, is 55 to 60%. The left ventricle has normal function. The left ventricle has no regional wall motion abnormalities. The left ventricular internal cavity size was normal in size. There is  no left ventricular hypertrophy. Left ventricular diastolic parameters were normal. Right Ventricle: The right ventricular size is normal. No increase in right ventricular wall thickness. Right ventricular systolic function is normal. There is normal pulmonary artery systolic pressure. The tricuspid regurgitant velocity is 2.02 m/s, and  with an assumed right atrial pressure of 3 mmHg, the estimated right ventricular systolic pressure is 19.3 mmHg. Left Atrium: Left atrial size was normal in size. Right Atrium: Right atrial size was normal in size. Pericardium: There is no evidence of pericardial effusion. Mitral Valve: The mitral valve is normal in structure. Normal mobility of the mitral valve leaflets. Trivial mitral valve regurgitation. No evidence of mitral valve stenosis. Tricuspid Valve: The tricuspid valve is normal in structure. Tricuspid valve regurgitation is trivial. No evidence of tricuspid stenosis. Aortic Valve: The aortic valve is tricuspid. Aortic valve regurgitation is not visualized. No aortic stenosis is present. Pulmonic Valve: The pulmonic valve was normal in structure. Pulmonic valve regurgitation is not visualized. No evidence of pulmonic stenosis. Aorta: The aortic root is normal in size and structure. Venous: The inferior vena cava is normal in size with greater than 50% respiratory variability, suggesting right atrial pressure of 3 mmHg. IAS/Shunts: No  atrial level shunt detected by color flow Doppler.  LEFT VENTRICLE PLAX 2D LVIDd:         4.50 cm  Diastology LVIDs:         3.10 cm  LV e' lateral:   21.20 cm/s LV PW:         0.70 cm  LV E/e' lateral: 3.9 LV IVS:        0.90 cm  LV e' medial:    10.90 cm/s LVOT diam:     1.90 cm  LV E/e' medial:  7.6 LV SV:         52 LV SV Index:   29 LVOT Area:     2.84 cm  RIGHT VENTRICLE             IVC RV S prime:     12.00 cm/s  IVC diam: 1.20 cm TAPSE (M-mode): 2.1 cm RVSP:           19.3 mmHg LEFT ATRIUM             Index       RIGHT ATRIUM           Index LA diam:        2.70 cm 1.51 cm/m  RA Pressure: 3.00 mmHg LA Vol (A2C):   37.6 ml 21.07 ml/m RA Area:     10.90 cm LA Vol (A4C):   22.3 ml 12.50 ml/m RA Volume:   22.10 ml  12.38 ml/m LA Biplane Vol: 28.8  ml 16.14 ml/m  AORTIC VALVE LVOT Vmax:   95.80 cm/s LVOT Vmean:  63.700 cm/s LVOT VTI:    0.185 m  AORTA Ao Root diam: 2.90 cm Ao Asc diam:  2.90 cm MV E velocity: 83.10 cm/s  TRICUSPID VALVE MV A velocity: 62.40 cm/s  TR Peak grad:   16.3 mmHg MV E/A ratio:  1.33        TR Vmax:        202.00 cm/s                            Estimated RAP:  3.00 mmHg                            RVSP:           19.3 mmHg                             SHUNTS                            Systemic VTI:  0.18 m                            Systemic Diam: 1.90 cm Skeet Latch MD Electronically signed by Skeet Latch MD Signature Date/Time: 01/08/2020/6:20:49 PM    Final    LONG TERM MONITOR-LIVE TELEMETRY (3-14 DAYS)  Result Date: 01/18/2020 Indication:palpitations Duration: 8d Findings HR  avg 89  Min 51-Max 164 PVCs <1% PACs <1%  Symptoms Lightheadedness >> sinus rhythm Pounding >> sinus rhythm Skipped beats>> ventricular couplet Triggered without symptoms >> sinus a     >> isolated PVCs Recommendations No serious arrhyhtmias were noted Most symptoms were associated with sinus rhythm Rare symptomatic ventricular ectopic    EKG: *    Assessment and Plan:  Heat  intolerance  Joint Laxity syndrome  Elevated blood pressure  We discussed extensively the issues of dysautonomia, the physiology of orthstasis and positional stress.  We discussed the role of salt and water repletion, the importance of exercise, often needing to be started in the recumbent position, and the awareness of triggers and the role of ambient heat and dehydration  I have asked her to reach out to her gynecologist to consider therapies for menses suppression that would not be contraindicated by her migraine headaches.  Discussed compressive wear; given her heat intolerance suggested that thigh compression might be more easily tolerated than abdominal compression  Encouraged aerobic exercise.  Obviously a challenge with working full-time and 3 children.  Her elevated blood pressure makes salt repletion problematic.  It does raise the possibility however of low-dose beta-blockade to attenuate her adrenergic responses.  I have asked her to try to record blood pressures following her showers which she tolerates poorly.  Would want to make sure that she does not have hypotension with her shower intolerance.  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   Virl Axe

## 2020-02-04 NOTE — Patient Instructions (Signed)
Medication Instructions:  Your physician recommends that you continue on your current medications as directed. Please refer to the Current Medication list given to you today.  *If you need a refill on your cardiac medications before your next appointment, please call your pharmacy*   Lab Work: none If you have labs (blood work) drawn today and your tests are completely normal, you will receive your results only by: Marland Kitchen MyChart Message (if you have MyChart) OR . A paper copy in the mail If you have any lab test that is abnormal or we need to change your treatment, we will call you to review the results.   Testing/Procedures: none   Follow-Up: At [facility], you and your health needs are our priority.  As part of our continuing mission to provide you with exceptional heart care, we have created designated Provider Care Teams.  These Care Teams include your primary Cardiologist (physician) and Advanced Practice Providers (APPs -  Physician Assistants and Nurse Practitioners) who all work together to provide you with the care you need, when you need it.  We recommend signing up for the patient portal called "MyChart".  Sign up information is provided on this After Visit Summary.  MyChart is used to connect with patients for Virtual Visits (Telemedicine).  Patients are able to view lab/test results, encounter notes, upcoming appointments, etc.  Non-urgent messages can be sent to your provider as well.   To learn more about what you can do with MyChart, go to [email or web address].    Your next appointment:   4 month(s)  The format for your next appointment:   In Person  Provider:   Sherryl Manges, MD   Other Instructions

## 2020-02-20 ENCOUNTER — Telehealth: Payer: No Typology Code available for payment source | Admitting: Nurse Practitioner

## 2020-02-20 DIAGNOSIS — L2083 Infantile (acute) (chronic) eczema: Secondary | ICD-10-CM | POA: Diagnosis not present

## 2020-02-20 MED ORDER — NYSTATIN-TRIAMCINOLONE 100000-0.1 UNIT/GM-% EX CREA: 1.0000 "application " | TOPICAL_CREAM | Freq: Two times a day (BID) | CUTANEOUS | 0 refills | Status: DC

## 2020-02-20 MED FILL — NYSTATIN-TRIAMCINOLONE CRM: 100000-0.1 | 10 days supply | Qty: 30 | Fill #0

## 2020-02-20 NOTE — Progress Notes (Signed)
E-Visit for Eczema  We are sorry that you are not feeling well. Here is how we plan to help! Based on what you shared with me it looks like you have eczema (atopic dermatitis).  Although the cause of eczema is not completely understood, genetics appear to play a strong role, and people with a family history of eczema are at increased risk of developing the condition. In most people with eczema, there is a genetic abnormality in the outermost layer of the skin, called the epidermis   Most people with eczema develop their first symptoms as children, before the age of 85. Intense itching of the skin, patches of redness, small bumps, and skin flaking are common. Scratching can further inflame the skin and worsen the itching. The itchiness may be more noticeable at nighttime.  Eczema commonly affects the back of the neck, the elbow creases, and the backs of the knees. Other affected areas may include the face, wrists, and forearms. The skin may become thickened and darkened, or even scarred, from repeated scratching. Eliminating factors that aggravate your eczema symptoms can help to control the symptoms. Possible triggers may include: ? Cold or dry environments ? Sweating ? Emotional stress or anxiety ? Rapid temperature changes ? Exposure to certain chemicals or cleaning solutions, including soaps and detergents, perfumes and cosmetics, wool or synthetic fibers, dust, sand, and cigarette smoke Keeping your skin hydrated Emollients -- Emollients are creams and ointments that moisturize the skin and prevent it from drying out. The best emollients for people with eczema are thick creams (such as Eucerin, Cetaphil, and Nutraderm) or ointments (such as petroleum jelly, Aquaphor, and Vaseline), which contain little to no water. Emollients are most effective when applied immediately after bathing. Emollients can be applied twice a day or more often if needed. Lotions contain more water than creams and  ointments and are less effective for moisturizing the skin. Bathing -- It is not clear if showers or baths are better for keeping the skin hydrated. Lukewarm baths or showers can hydrate and cool the skin, temporarily relieving itching from eczema. An unscented, mild soap or non-soap cleanser (such as Cetaphil) should be used sparingly. Apply an emollient immediately after bathing or showering to prevent your skin from drying out as a result of water evaporation. Emollient bath additives (products you add to the bath water) have not been found to help relieve symptoms. Hot or long baths (more than 10 to 15 minutes) and showers should be avoided since they can dry out the skin.  Based on what you shared with me you may have eczema.   Meds ordered this encounter  Medications  . nystatin-triamcinolone (MYCOLOG II) cream    Sig: Apply 1 application topically 2 (two) times daily.    Dispense:  30 g    Refill:  0    Order Specific Question:   Supervising Provider    Answer:   Eber Hong [[id number]]     Based on what you shared with me you may have a skin infection.      I recommend dilute bleach baths for people with eczema. These baths help to decrease the number of bacteria on the skin that can cause infections or worsen symptoms. To prepare a bleach bath, one-fourth to one-half cup of bleach is placed in a full bathtub (about 40 gallons) of water. Bleach baths are usually taken for 5 to 10 minutes twice per week and should be followed by application of an emollient (listed above). I  recommend you take Benadryl 25mg  - 50mg  every 4 hours to control the symptoms (including itching) but if they last over 24 hours it is best that you see an office based provider for follow up.  HOME CARE: . Take lukewarm showers or baths . Apply creams and ointments to prevent the skin from drying (Eucerin, Cetaphil, Nutraderm, petroleum jelly, Aquaphor or Vaseline) - these products contain less water than other  lotions and are more effective for moisturizing the skin . Limit exposure to cold or dry environments, sweating, emotional stress and anxiety, rapid temperature changes and exposure to chemicals and cleaning products, soaps and detergents, perfumes, cosmetics, wool and synthetic fibers, dust, sand and cigarette- factors which can aggravate eczema symptoms.  . Use a hydrocortisone cream once or twice a day . Take an antihistamine like Benadryl for widespread rashes that itch.  The adult dosage of Benadryl is 25-50 mg by mouth 4 times daily. Caution: This type of medication may cause sleepiness.  Do not drink alcohol, drive, or operate dangerous machinery while taking antihistamines.  Do not take these medications if you have prostate enlargement.  Read the package instructions thoroughly on all medications that you take.  GET HELP RIGHT AWAY IF: . Symptoms that don't go away after treatment. . Severe itching that persists. . You develop a fever. . Your skin begins to drain. . You have a sore throat. . You become short of breath.  MAKE SURE YOU    Understand these instructions.  Will watch your condition.  Will get help right away if you are not doing well or get worse.  Thank you for choosing an e-visit. Your e-visit answers were reviewed by a board certified advanced clinical practitioner to complete your personal care plan. Depending upon the condition, your plan could have included both over the counter or prescription medications. Please review your pharmacy choice. Make sure the pharmacy is open so you can pick up prescription now. If there is a problem, you may contact your provider through and have the prescription routed to another pharmacy. Your safety is important to . If you have drug allergies check your prescription carefully.  For the next 24 hours you can use MyChart to ask questions about today's visit, request a non-urgent call back, or ask for a work or  school excuse. You will get an email in the next two days asking about your experience. I hope that your e-visit has been valuable and will speed your recovery   5-10 minutes spent reviewing and documenting in chart.

## 2020-02-22 MED FILL — BACLOFEN 10 MG TABS: 10 | 30 days supply | Qty: 90 | Fill #1

## 2020-03-04 NOTE — Telephone Encounter (Signed)
I called [facility] [phone]) to begin PA for 8/3 Botox injection. I spoke with [facility], who states PA is needed if cost is over 1,500.00. Alison Nichols initiated [place] for [phone] and [phone] for G43.709. He states PA is pending, and there is no pending PA number. He directed me to fax medical records to [phone]). Reference [id number].

## 2020-03-05 NOTE — Telephone Encounter (Signed)
Alison Nichols with [facility] called and requested additional clinical notes to assist with PA approval. [name] requested patient's MRI results from her 6/9 MRI. I printed them off and faxed them to [phone]).

## 2020-03-11 NOTE — Telephone Encounter (Signed)
I called [facility] [phone]) to check the status of the PA. I spoke with Randa Evens. She states Botox PA was approved. PA #[id number] (03/05/20- 08/22/20). Patient is buy & bill.

## 2020-03-15 ENCOUNTER — Other Ambulatory Visit: Payer: Self-pay | Admitting: Family Medicine

## 2020-03-17 MED ORDER — AMPHETAMINE-DEXTROAMPHET ER 30 MG PO CP24: 30.0000 mg | ORAL_CAPSULE | Freq: Every day | ORAL | 0 refills | Status: DC

## 2020-03-17 MED FILL — ADDERALL XR 30 MG CAP SA: 30 | 30 days supply | Qty: 30 | Fill #0

## 2020-03-17 NOTE — Telephone Encounter (Signed)
Last refill: 02/01/20 #30, 0 Last OV: 12/20/19 dx. ADHD

## 2020-03-24 NOTE — Progress Notes (Signed)
This is her first Botox procedure. MRI was normal in 01/2020. She has tried and failed multiple preventative medications. She felt that Ajovy helped with frequency but intensity worsened. She uses sumatriptan 100mg  for abortive therapy.   Consent Form Botulism Toxin Injection For Chronic Migraine .    Reviewed orally with patient, additionally signature is on file:  Botulism toxin has been approved by the Federal drug administration for treatment of chronic migraine. Botulism toxin does not cure chronic migraine and it may not be effective in some patients.  The administration of botulism toxin is accomplished by injecting a small amount of toxin into the muscles of the neck and head. Dosage must be titrated for each individual. Any benefits resulting from botulism toxin tend to wear off after 3 months with a repeat injection required if benefit is to be maintained. Injections are usually done every 3-4 months with maximum effect peak achieved by about 2 or 3 weeks. Botulism toxin is expensive and you should be sure of what costs you will incur resulting from the injection.  The side effects of botulism toxin use for chronic migraine may include:   -Transient, and usually mild, facial weakness with facial injections  -Transient, and usually mild, head or neck weakness with head/neck injections  -Reduction or loss of forehead facial animation due to forehead muscle weakness  -Eyelid drooping  -Dry eye  -Pain at the site of injection or bruising at the site of injection  -Double vision  -Potential unknown long term risks   Contraindications: You should not have Botox if you are pregnant, nursing, allergic to albumin, have an infection, skin condition, or muscle weakness at the site of the injection, or have myasthenia gravis, Lambert-Eaton syndrome, or ALS.  It is also possible that as with any injection, there may be an allergic reaction or no effect from the medication. Reduced  effectiveness after repeated injections is sometimes seen and rarely infection at the injection site may occur. All care will be taken to prevent these side effects. If therapy is given over a long time, atrophy and wasting in the muscle injected may occur. Occasionally the patient's become refractory to treatment because they develop antibodies to the toxin. In this event, therapy needs to be modified.  I have read the above information and consent to the administration of botulism toxin.    BOTOX PROCEDURE NOTE FOR MIGRAINE HEADACHE  Contraindications and precautions discussed with patient(above). Aseptic procedure was observed and patient tolerated procedure. Procedure performed by , FNP-C.   The condition has existed for more than 6 months, and pt does not have a diagnosis of ALS, Myasthenia Gravis or Lambert-Eaton Syndrome.  Risks and benefits of injections discussed and pt agrees to proceed with the procedure.  Written consent obtained  These injections are medically necessary. Pt  receives good benefits from these injections. These injections do not cause sedations or hallucinations which the oral therapies may cause.   Description of procedure:  The patient was placed in a sitting position. The standard protocol was used for Botox as follows, with 5 units of Botox injected at each site:  -Procerus muscle, midline injection  -Corrugator muscle, bilateral injection  -Frontalis muscle, bilateral injection, with 2 sites each side, medial injection was performed in the upper one third of the frontalis muscle, in the region vertical from the medial inferior edge of the superior orbital rim. The lateral injection was again in the upper one third of the forehead vertically above the  lateral limbus of the cornea, 1.5 cm lateral to the medial injection site.  -Temporalis muscle injection, 4 sites, bilaterally. The first injection was 3 cm above the tragus of the ear, second injection  site was 1.5 cm to 3 cm up from the first injection site in line with the tragus of the ear. The third injection site was 1.5-3 cm forward between the first 2 injection sites. The fourth injection site was 1.5 cm posterior to the second injection site. 5th site laterally in the temporalis  muscleat the level of the outer canthus.  -Occipitalis muscle injection, 3 sites, bilaterally. The first injection was done one half way between the occipital protuberance and the tip of the mastoid process behind the ear. The second injection site was done lateral and superior to the first, 1 fingerbreadth from the first injection. The third injection site was 1 fingerbreadth superiorly and medially from the first injection site.  -Cervical paraspinal muscle injection, 2 sites, bilaterally. The first injection site was 1 cm from the midline of the cervical spine, 3 cm inferior to the lower border of the occipital protuberance. The second injection site was 1.5 cm superiorly and laterally to the first injection site.  -Trapezius muscle injection was performed at 3 sites, bilaterally. The first injection site was in the upper trapezius muscle halfway between the inflection point of the neck, and the acromion. The second injection site was one half way between the acromion and the first injection site. The third injection was done between the first injection site and the inflection point of the neck.   Will return for repeat injection in 3 months.   A total of 200 units of Botox was prepared, 155 units of Botox was injected as documented above, any Botox not injected was wasted. The patient tolerated the procedure well, there were no complications of the above procedure.

## 2020-03-25 ENCOUNTER — Ambulatory Visit: Payer: No Typology Code available for payment source | Admitting: Family Medicine

## 2020-03-25 DIAGNOSIS — G43709 Chronic migraine without aura, not intractable, without status migrainosus: Secondary | ICD-10-CM | POA: Diagnosis not present

## 2020-03-25 NOTE — Progress Notes (Signed)
Botox-200unitsx1 vials Lot: [id number] Expiration: 04/24 NDC: 0601-5615-37   0.9% Sodium Chloride- 76mL total Lot: [id number] Expiration: 05/2022 NDC: 47092-957-47  Dx: Migraines B/B   Consent signed

## 2020-03-27 MED FILL — SERTRALINE HCL 100 MG TABS: 100 | 90 days supply | Qty: 90 | Fill #1

## 2020-04-01 ENCOUNTER — Ambulatory Visit: Payer: No Typology Code available for payment source | Admitting: Internal Medicine

## 2020-04-08 ENCOUNTER — Ambulatory Visit: Payer: No Typology Code available for payment source | Admitting: Orthopaedic Surgery

## 2020-04-08 ENCOUNTER — Encounter: Payer: Self-pay | Admitting: Orthopaedic Surgery

## 2020-04-08 ENCOUNTER — Ambulatory Visit: Payer: Self-pay

## 2020-04-08 VITALS — Ht 67.0 in | Wt 150.0 lb

## 2020-04-08 DIAGNOSIS — M25551 Pain in right hip: Secondary | ICD-10-CM | POA: Diagnosis not present

## 2020-04-08 MED ORDER — IBUPROFEN 800 MG PO TABS
800.0000 mg | ORAL_TABLET | Freq: Three times a day (TID) | ORAL | 2 refills | Status: DC | PRN
Start: 2020-04-08 — End: 2020-05-29

## 2020-04-08 MED FILL — IBUPROFEN 800 MG TAB: 800 | 10 days supply | Qty: 30 | Fill #0

## 2020-04-08 NOTE — Progress Notes (Signed)
Office Visit Note   Patient: Alison Nichols           Date of Birth: 1985-10-29           MRN: [id number] Visit Date: 04/08/2020              Requested by: Orland Mustard, MD [address] [place],  [place] [address] PCP: Orland Mustard, MD   Assessment & Plan: Visit Diagnoses:  1. Pain in right hip     Plan: Impression is right hip pain questionable labral tear versus hip flexor tendinitis.  I recommend physical therapy and a 2-week course of ibuprofen.  We briefly discussed the potential of the injection as well.  Follow-Up Instructions: Return if symptoms worsen or fail to improve.   Orders:  Orders Placed This Encounter  Procedures   XR HIP UNILAT W OR W/O PELVIS 2-3 VIEWS RIGHT   Ambulatory referral to Physical Therapy   Meds ordered this encounter  Medications   ibuprofen (ADVIL) 800 MG tablet    Sig: Take 1 tablet (800 mg total) by mouth every 8 (eight) hours as needed.    Dispense:  30 tablet    Refill:  2      Procedures: No procedures performed   Clinical Data: No additional findings.   Subjective: Chief Complaint  Patient presents with   Right Hip - Pain    Alison Nichols is a 34 year old female here for evaluation of right hip and groin pain for the last several months with worsening in the last 2 weeks.  Denies any injuries.  She feels like the right hip needs to pop.  She takes occasional ibuprofen which helps.  Denies any back pain or radiculopathy.   Review of Systems  Constitutional: Negative.   HENT: Negative.   Eyes: Negative.   Respiratory: Negative.   Cardiovascular: Negative.   Endocrine: Negative.   Musculoskeletal: Negative.   Neurological: Negative.   Hematological: Negative.   Psychiatric/Behavioral: Negative.   All other systems reviewed and are negative.    Objective: Vital Signs: Ht 5\' 7"  (1.702 m)    Wt 150 lb (68 kg)    BMI 23.49 kg/m   Physical Exam Vitals and nursing note reviewed.  Constitutional:       Appearance: She is well-developed.  Pulmonary:     Effort: Pulmonary effort is normal.  Skin:    General: Skin is warm.     Capillary Refill: Capillary refill takes less than 2 seconds.  Neurological:     Mental Status: She is alert and oriented to person, place, and time.  Psychiatric:        Behavior: Behavior normal.        Thought Content: Thought content normal.        Judgment: Judgment normal.     Ortho Exam Right hip shows normal range of motion with pain at the extremes of hip flexion and internal rotation and Faber.  No pain with quadricep stretch or hip flexor stretch.  No significant pain with straight leg raise. Specialty Comments:  No specialty comments available.  Imaging: XR HIP UNILAT W OR W/O PELVIS 2-3 VIEWS RIGHT  Result Date: 04/08/2020 No acute or structural abnormalities.  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   Mild asthma without complication 01/27/2008   GERD 01/27/2008   Chronic migraine without aura without status migrainosus, not intractable 01/27/2008   HEMORRHOIDS, EXTERNAL 02/18/2006   Past Medical History:  Diagnosis Date   Anxiety    Asthma    ASTHMA 01/27/2008   Qualifier: Diagnosis of  By: Thereasa Solo     Depression    Ehlers-Danlos disease    Headache    Hypertension affecting pregnancy 2019   IBS (irritable bowel syndrome)    Migraines    Scoliosis    Vaginal Pap smear, abnormal     Family History  Problem Relation Age of Onset   Heart failure Maternal Grandmother    Hypertension Maternal Grandmother    Thyroid disease Maternal Grandmother    Arthritis Maternal  Grandmother    Heart attack Maternal Grandmother    CAD Maternal Grandmother    Migraines Maternal Grandmother    Depression Mother    Hyperlipidemia Mother    Hypertension Mother    Migraines Mother    Hypertension Maternal Grandfather    Kidney disease Maternal Grandfather    Epilepsy Maternal Grandfather     Past Surgical History:  Procedure Laterality Date   CHOLECYSTECTOMY  2002   COLPOSCOPY     COMBINED HYSTEROSCOPY DIAGNOSTIC / D&C  2004   HYSTEROSCOPY WITH D & C  2004   KNEE ARTHROSCOPY  1998 & 1999   TONSILLECTOMY     w/ Adenoidectomy   Social History   Occupational History   Not on file  Tobacco Use   Smoking status: Never Smoker   Smokeless tobacco: Never Used  Vaping Use   Vaping Use: Never used  Substance and Sexual Activity   Alcohol use: Never   Drug use: Never   Sexual activity: Yes    Birth control/protection: I.U.D.

## 2020-04-10 ENCOUNTER — Encounter: Payer: Self-pay | Admitting: Orthopaedic Surgery

## 2020-04-10 NOTE — Telephone Encounter (Signed)
Thank's for letting us know.  I think still good to do PT

## 2020-04-21 ENCOUNTER — Other Ambulatory Visit: Payer: Self-pay | Admitting: Family Medicine

## 2020-04-21 NOTE — Telephone Encounter (Signed)
Pt requesting Adderall 30 mg tab LOV: 12/20/2019 Last refill: 03/17/2020  Approve?

## 2020-04-22 MED ORDER — AMPHETAMINE-DEXTROAMPHET ER 30 MG PO CP24: 30.0000 mg | ORAL_CAPSULE | Freq: Every day | ORAL | 0 refills | Status: DC

## 2020-04-22 MED FILL — HYDROXYZINE HCL 25 MG TABS: 25 | 90 days supply | Qty: 90 | Fill #1

## 2020-04-22 MED FILL — ADDERALL XR 30 MG CAP SA: 30 | 30 days supply | Qty: 30 | Fill #0

## 2020-05-20 ENCOUNTER — Telehealth: Payer: No Typology Code available for payment source | Admitting: Physician Assistant

## 2020-05-20 ENCOUNTER — Other Ambulatory Visit: Payer: Self-pay | Admitting: Family Medicine

## 2020-05-20 DIAGNOSIS — N949 Unspecified condition associated with female genital organs and menstrual cycle: Secondary | ICD-10-CM

## 2020-05-20 MED ORDER — FLUCONAZOLE 150 MG PO TABS: 150.0000 mg | ORAL_TABLET | Freq: Once | ORAL | 0 refills | Status: AC

## 2020-05-20 MED ORDER — NYSTATIN-TRIAMCINOLONE 100000-0.1 UNIT/GM-% EX OINT: 1.0000 "application " | TOPICAL_OINTMENT | Freq: Two times a day (BID) | CUTANEOUS | 0 refills | Status: DC

## 2020-05-20 MED FILL — FLUCONAZOLE 150 MG TABS: 150 | 1 days supply | Qty: 1 | Fill #0

## 2020-05-20 MED FILL — NYSTATIN-TRIAMCINOLONE OINT: 100000-0.1 | 15 days supply | Qty: 30 | Fill #0

## 2020-05-20 NOTE — Telephone Encounter (Signed)
Last refill: 04/22/20 #30, 0 Last OV: 12/20/19 dx. ADHD

## 2020-05-20 NOTE — Progress Notes (Signed)
We are sorry that you are not feeling well. Here is how we plan to help! Based on what you shared with me it looks like you: May have a yeast vaginosis  Vaginosis is an inflammation of the vagina that can result in discharge, itching and pain. The cause is usually a change in the normal balance of vaginal bacteria or an infection. Vaginosis can also result from reduced estrogen levels after menopause.  The most common causes of vaginosis are:   Bacterial vaginosis which results from an overgrowth of one on several organisms that are normally present in your vagina.   Yeast infections which are caused by a naturally occurring fungus called candida.   Vaginal atrophy (atrophic vaginosis) which results from the thinning of the vagina from reduced estrogen levels after menopause.   Trichomoniasis which is caused by a parasite and is commonly transmitted by sexual intercourse.  Factors that increase your risk of developing vaginosis include: Marland Kitchen Medications, such as antibiotics and steroids . Uncontrolled diabetes . Use of hygiene products such as bubble bath, vaginal spray or vaginal deodorant . Douching . Wearing damp or tight-fitting clothing . Using an intrauterine device (IUD) for birth control . Hormonal changes, such as those associated with pregnancy, birth control pills or menopause . Sexual activity . Having a sexually transmitted infection  Your treatment plan is a refill of your nystatin-triamcinolone cream to be applied twice daily. I have also prescribed a single dose of oral diflucan as this appears to be a recurrent infection.   Be sure to take all of the medication as directed. Stop taking any medication if you develop a rash, tongue swelling or shortness of breath. Mothers who are breast feeding should consider pumping and discarding their breast milk while on these antibiotics. However, there is no consensus that infant exposure at these doses would be harmful.  Remember that  medication creams can weaken latex condoms. Marland Kitchen   HOME CARE:  Good hygiene may prevent some types of vaginosis from recurring and may relieve some symptoms:  . Avoid baths, hot tubs and whirlpool spas. Rinse soap from your outer genital area after a shower, and dry the area well to prevent irritation. Don't use scented or harsh soaps, such as those with deodorant or antibacterial action. Marland Kitchen Avoid irritants. These include scented tampons and pads. . Wipe from front to back after using the toilet. Doing so avoids spreading fecal bacteria to your vagina.  Other things that may help prevent vaginosis include:  Marland Kitchen Don't douche. Your vagina doesn't require cleansing other than normal bathing. Repetitive douching disrupts the normal organisms that reside in the vagina and can actually increase your risk of vaginal infection. Douching won't clear up a vaginal infection. . Use a latex condom. Both female and female latex condoms may help you avoid infections spread by sexual contact. . Wear cotton underwear. Also wear pantyhose with a cotton crotch. If you feel comfortable without it, skip wearing underwear to bed. Yeast thrives in [employer] Your symptoms should improve in the next day or two.  GET HELP RIGHT AWAY IF:  . You have pain in your lower abdomen ( pelvic area or over your ovaries) . You develop nausea or vomiting . You develop a fever . Your discharge changes or worsens . You have persistent pain with intercourse . You develop shortness of breath, a rapid pulse, or you faint.  These symptoms could be signs of problems or infections that need to be evaluated by a  medical provider now.  MAKE SURE YOU    Understand these instructions.  Will watch your condition.  Will get help right away if you are not doing well or get worse.  Your e-visit answers were reviewed by a board certified advanced clinical practitioner to complete your personal care plan. Depending upon the  condition, your plan could have included both over the counter or prescription medications. Please review your pharmacy choice to make sure that you have choses a pharmacy that is open for you to pick up any needed prescription, Your safety is important to [place]. If you have drug allergies check your prescription carefully.   You can use MyChart to ask questions about today's visit, request a non-urgent call back, or ask for a work or school excuse for 24 hours related to this e-Visit. If it has been greater than 24 hours you will need to follow up with your provider, or enter a new e-Visit to address those concerns. You will get a MyChart message within the next two days asking about your experience. I hope that your e-visit has been valuable and will speed your recovery.  Greater than 5 minutes, yet less than 10 minutes of time have been spent researching, coordinating, and implementing care for this patient today.

## 2020-05-21 MED ORDER — AMPHETAMINE-DEXTROAMPHET ER 30 MG PO CP24
30.0000 mg | ORAL_CAPSULE | Freq: Every day | ORAL | 0 refills | Status: DC
Start: 2020-05-21 — End: 2020-07-08

## 2020-05-21 MED FILL — ADDERALL XR 30 MG CAP SA: 30 | 30 days supply | Qty: 30 | Fill #0

## 2020-05-23 ENCOUNTER — Other Ambulatory Visit: Payer: Self-pay

## 2020-05-23 ENCOUNTER — Encounter (HOSPITAL_BASED_OUTPATIENT_CLINIC_OR_DEPARTMENT_OTHER): Payer: Self-pay | Admitting: Obstetrics & Gynecology

## 2020-05-23 NOTE — Progress Notes (Signed)
Spoke w/ via phone for pre-op interview---pt Lab needs dos----urine preg , cbc t & s              Lab results------ekg 12-20-2019 echo 01-08-2020 lov dr Graciela Husbands dysautonomia 02-04-2020 epic COVID test ------05-28-2020 @ 1515 appt made by covid test site Arrive at -------530 am 05-29-2020 NPO after MN NO Solid Food.  Clear liquids from MN until---430 am then npo Medications to take morning of surgery -----bring ventolin inhaler Diabetic medication -----n/a Patient Special Instructions -----none Pre-Op special Istructions -----none Patient verbalized understanding of instructions that were given at this phone interview. Patient denies shortness of breath, chest pain, fever, cough at this phone interview.

## 2020-05-26 ENCOUNTER — Other Ambulatory Visit: Payer: Self-pay | Admitting: Family Medicine

## 2020-05-26 MED FILL — SUMATRIPTAN SUCC 100 MG TAB: 100 | 30 days supply | Qty: 9 | Fill #0

## 2020-05-26 NOTE — H&P (Signed)
Alison Nichols is an 34 y.o. female [phone] with heavy menstrual bleeding and desire for sterility.  Patient currently has Paragard which has caused continued heavy menstrual bleeding.  She refuses hormonal contraception and has completed child-bearing.    Pertinent Gynecological History: Menses: flow is excessive with use of 8 pads or tampons on heaviest days Bleeding: dysfunctional uterine bleeding Contraception: IUD DES exposure: unknown Blood transfusions: none Sexually transmitted diseases: no past history Previous GYN Procedures: DNC  Last mammogram: n/a Date: n/a Last pap: normal Date: 2019 OB History: G5, P1223   Menstrual History: Menarche age: n/a Patient's last menstrual period was 05/14/2020.    Past Medical History:  Diagnosis Date  . Anxiety   . Asthma   . Complication of anesthesia   . COVID-19 01/2019   scrachy throat x few days all symptoms resolved  . Depression   . Ehlers-Danlos disease   . Headache   . History of gestational hypertension 2019  . IBS (irritable bowel syndrome)   . Migraines   . PONV (postoperative nausea and vomiting)   . Scoliosis   . Vaginal Pap smear, abnormal     Past Surgical History:  Procedure Laterality Date  . CHOLECYSTECTOMY  2002  . COLPOSCOPY  2004  . COMBINED HYSTEROSCOPY DIAGNOSTIC / D&C  2004  . HYSTEROSCOPY WITH D & C  2004  . KNEE ARTHROSCOPY Right 1998 & 1999  . sinus sugrery  1998  . TONSILLECTOMY  as child   w/ Adenoidectomy    Family History  Problem Relation Age of Onset  . Heart failure Maternal Grandmother   . Hypertension Maternal Grandmother   . Thyroid disease Maternal Grandmother   . Arthritis Maternal Grandmother   . Heart attack Maternal Grandmother   . CAD Maternal Grandmother   . Migraines Maternal Grandmother   . Depression Mother   . Hyperlipidemia Mother   . Hypertension Mother   . Migraines Mother   . Hypertension Maternal Grandfather   . Kidney disease Maternal Grandfather   .  Epilepsy Maternal Grandfather     Social History:  reports that she has never smoked. She has never used smokeless tobacco. She reports that she does not drink alcohol and does not use drugs.  Allergies:  Allergies  Allergen Reactions  . Cephalexin Hives    Keflex is hives    No medications prior to admission.    Review of Systems  Height 5\' 6"  (1.676 m), weight 68 kg, last menstrual period 05/14/2020. Physical Exam Constitutional:      Appearance: Normal appearance.  HENT:     Head: Normocephalic and atraumatic.  Cardiovascular:     Rate and Rhythm: Normal rate and regular rhythm.  Pulmonary:     Effort: Pulmonary effort is normal.     Breath sounds: Normal breath sounds.  Abdominal:     Palpations: Abdomen is soft.  Musculoskeletal:        General: Normal range of motion.     Cervical back: Normal range of motion.  Skin:    General: Skin is warm and dry.  Neurological:     General: No focal deficit present.     Mental Status: She is alert and oriented to person, place, and time.  Psychiatric:        Mood and Affect: Mood normal.        Behavior: Behavior normal.     No results found for this or any previous visit (from the past 24 hour(s)).  No results  found.  Assessment/Plan: 34yo with heavy menstrual bleeding and desire for sterility -H/S, D&C with endometrial ablation, removal of IUD and laparoscopic bilateral tubal ligation -Patient is counseled re: risk of bleeding, infection, scarring and damage to surrounding structures.  She is counseled re: risk of failure, regret and ectopic with BTL.  She is counseled re: possibility of required hysterectomy in 50% endometrial ablation cases.  All questions were answered and patient wishes to proceed.  Mitchel Honour 05/26/2020, 9:12 AM

## 2020-05-27 ENCOUNTER — Other Ambulatory Visit (HOSPITAL_COMMUNITY)
Admission: RE | Admit: 2020-05-27 | Discharge: 2020-05-27 | Disposition: A | Discharge status: Home / Self Care | Payer: No Typology Code available for payment source | Source: Ambulatory Visit | Attending: Obstetrics & Gynecology | Admitting: Obstetrics & Gynecology

## 2020-05-27 DIAGNOSIS — Z01812 Encounter for preprocedural laboratory examination: Secondary | ICD-10-CM | POA: Diagnosis present

## 2020-05-27 DIAGNOSIS — Z20822 Contact with and (suspected) exposure to covid-19: Secondary | ICD-10-CM | POA: Insufficient documentation

## 2020-05-27 LAB — SARS CORONAVIRUS 2 (TAT 6-24 HRS): SARS Coronavirus 2: NEGATIVE

## 2020-05-28 ENCOUNTER — Other Ambulatory Visit (HOSPITAL_COMMUNITY): Admission: RE | Admit: 2020-05-28 | Payer: No Typology Code available for payment source | Source: Ambulatory Visit

## 2020-05-28 NOTE — Anesthesia Preprocedure Evaluation (Addendum)
Anesthesia Evaluation  Patient identified by MRN, date of birth, ID band Patient awake    Reviewed: Allergy & Precautions, NPO status , Patient's Chart, lab work & pertinent test results  History of Anesthesia Complications (+) PONV and history of anesthetic complications  Airway Mallampati: II  TM Distance: >3 FB Neck ROM: Full    Dental  (+) Dental Advisory Given, Teeth Intact   Pulmonary asthma ,    Pulmonary exam normal        Cardiovascular negative cardio ROS Normal cardiovascular exam     Neuro/Psych  Headaches, PSYCHIATRIC DISORDERS Anxiety Depression    GI/Hepatic Neg liver ROS, GERD  Controlled, IBS    Endo/Other  negative endocrine ROS  Renal/GU negative Renal ROS     Musculoskeletal  Ehlers-Danlos syndrome with joint hypermobility, 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 Scoliosis    Abdominal   Peds  (+) ADHD Hematology negative hematology ROS (+)   Anesthesia Other Findings Covid+ 01/2019, mild symptoms, all resolved Covid test negative   Reproductive/Obstetrics  Hx gHTN                             Anesthesia Physical Anesthesia Plan  ASA: II  Anesthesia Plan: General   Post-op Pain Management:    Induction: Intravenous  PONV Risk Score and Plan: 4 or greater and Treatment may vary due to age or medical condition, Ondansetron, Dexamethasone and Midazolam  Airway Management Planned: Oral ETT  Additional Equipment: None  Intra-op Plan:   Post-operative Plan: Extubation in OR  Informed Consent: I have reviewed the patients History and Physical, chart, labs and discussed the procedure including the risks, benefits and alternatives for the proposed anesthesia with the patient or authorized representative who has indicated his/her understanding and acceptance.     Dental advisory given  Plan Discussed with: CRNA and Anesthesiologist  Anesthesia Plan Comments:         Anesthesia Quick Evaluation

## 2020-05-29 ENCOUNTER — Encounter (HOSPITAL_BASED_OUTPATIENT_CLINIC_OR_DEPARTMENT_OTHER)
Admission: RE | Disposition: A | Discharge status: Home / Self Care | Payer: Self-pay | Source: Home / Self Care | Attending: Obstetrics & Gynecology

## 2020-05-29 ENCOUNTER — Ambulatory Visit (HOSPITAL_BASED_OUTPATIENT_CLINIC_OR_DEPARTMENT_OTHER)
Admission: RE | Admit: 2020-05-29 | Discharge: 2020-05-29 | Disposition: A | Discharge status: Home / Self Care | Payer: No Typology Code available for payment source | Attending: Obstetrics & Gynecology | Admitting: Obstetrics & Gynecology

## 2020-05-29 ENCOUNTER — Ambulatory Visit (HOSPITAL_BASED_OUTPATIENT_CLINIC_OR_DEPARTMENT_OTHER): Payer: No Typology Code available for payment source | Admitting: Anesthesiology

## 2020-05-29 ENCOUNTER — Other Ambulatory Visit (HOSPITAL_BASED_OUTPATIENT_CLINIC_OR_DEPARTMENT_OTHER): Payer: Self-pay | Admitting: Obstetrics & Gynecology

## 2020-05-29 ENCOUNTER — Encounter (HOSPITAL_BASED_OUTPATIENT_CLINIC_OR_DEPARTMENT_OTHER): Payer: Self-pay | Admitting: Obstetrics & Gynecology

## 2020-05-29 ENCOUNTER — Other Ambulatory Visit: Payer: Self-pay

## 2020-05-29 DIAGNOSIS — Z302 Encounter for sterilization: Secondary | ICD-10-CM | POA: Insufficient documentation

## 2020-05-29 DIAGNOSIS — Z8616 Personal history of COVID-19: Secondary | ICD-10-CM | POA: Insufficient documentation

## 2020-05-29 DIAGNOSIS — N979 Female infertility, unspecified: Secondary | ICD-10-CM

## 2020-05-29 DIAGNOSIS — N920 Excessive and frequent menstruation with regular cycle: Secondary | ICD-10-CM | POA: Insufficient documentation

## 2020-05-29 HISTORY — DX: Other specified postprocedural states: Z98.890

## 2020-05-29 HISTORY — PX: LAPAROSCOPIC TUBAL LIGATION: SHX1937

## 2020-05-29 HISTORY — DX: Nausea with vomiting, unspecified: R11.2

## 2020-05-29 HISTORY — DX: Other complications of anesthesia, initial encounter: T88.59XA

## 2020-05-29 HISTORY — PX: DILITATION & CURRETTAGE/HYSTROSCOPY WITH HYDROTHERMAL ABLATION: SHX5570

## 2020-05-29 LAB — CBC
HCT: 42.4 % (ref 36.0–46.0)
Hemoglobin: 14.3 g/dL (ref 12.0–15.0)
MCH: 30.2 pg (ref 26.0–34.0)
MCHC: 33.7 g/dL (ref 30.0–36.0)
MCV: 89.6 fL (ref 80.0–100.0)
Platelets: 311 10*3/uL (ref 150–400)
RBC: 4.73 MIL/uL (ref 3.87–5.11)
RDW: 13.4 % (ref 11.5–15.5)
WBC: 9.9 10*3/uL (ref 4.0–10.5)
nRBC: 0 % (ref 0.0–0.2)

## 2020-05-29 LAB — TYPE AND SCREEN
ABO/RH(D): A POS
Antibody Screen: NEGATIVE

## 2020-05-29 LAB — POCT PREGNANCY, URINE: Preg Test, Ur: NEGATIVE

## 2020-05-29 SURGERY — DILATATION & CURETTAGE/HYSTEROSCOPY WITH HYDROTHERMAL ABLATION
Anesthesia: General

## 2020-05-29 MED ORDER — ROCURONIUM BROMIDE 10 MG/ML (PF) SYRINGE
PREFILLED_SYRINGE | INTRAVENOUS | Status: DC | PRN
  Administered 2020-05-29: 50 mg via INTRAVENOUS

## 2020-05-29 MED ORDER — SCOPOLAMINE 1 MG/3DAYS TD PT72
MEDICATED_PATCH | TRANSDERMAL | Status: AC
  Filled 2020-05-29: qty 1

## 2020-05-29 MED ORDER — LIDOCAINE 2% (20 MG/ML) 5 ML SYRINGE
INTRAMUSCULAR | Status: DC | PRN
  Administered 2020-05-29: 60 mg via INTRAVENOUS

## 2020-05-29 MED ORDER — MIDAZOLAM HCL 2 MG/2ML IJ SOLN
INTRAMUSCULAR | Status: DC | PRN
  Administered 2020-05-29: 2 mg via INTRAVENOUS

## 2020-05-29 MED ORDER — EPHEDRINE 5 MG/ML INJ
INTRAVENOUS | Status: AC
  Filled 2020-05-29: qty 10

## 2020-05-29 MED ORDER — ONDANSETRON HCL 4 MG/2ML IJ SOLN
INTRAMUSCULAR | Status: DC | PRN
  Administered 2020-05-29: 4 mg via INTRAVENOUS

## 2020-05-29 MED ORDER — GLYCOPYRROLATE PF 0.2 MG/ML IJ SOSY
PREFILLED_SYRINGE | INTRAMUSCULAR | Status: DC | PRN
  Administered 2020-05-29: .2 mg via INTRAVENOUS

## 2020-05-29 MED ORDER — ARTIFICIAL TEARS OPHTHALMIC OINT
TOPICAL_OINTMENT | OPHTHALMIC | Status: AC
  Filled 2020-05-29: qty 3.5

## 2020-05-29 MED ORDER — EPHEDRINE SULFATE-NACL 50-0.9 MG/10ML-% IV SOSY
PREFILLED_SYRINGE | INTRAVENOUS | Status: DC | PRN
  Administered 2020-05-29: 10 mg via INTRAVENOUS

## 2020-05-29 MED ORDER — OXYCODONE HCL 5 MG PO TABS
ORAL_TABLET | ORAL | Status: AC
  Filled 2020-05-29: qty 1

## 2020-05-29 MED ORDER — ONDANSETRON HCL 4 MG/2ML IJ SOLN
INTRAMUSCULAR | Status: AC
  Filled 2020-05-29: qty 2

## 2020-05-29 MED ORDER — GLYCOPYRROLATE PF 0.2 MG/ML IJ SOSY
PREFILLED_SYRINGE | INTRAMUSCULAR | Status: AC
  Filled 2020-05-29: qty 1

## 2020-05-29 MED ORDER — SCOPOLAMINE 1 MG/3DAYS TD PT72
MEDICATED_PATCH | TRANSDERMAL | Status: DC | PRN
  Administered 2020-05-29: 1 via TRANSDERMAL

## 2020-05-29 MED ORDER — KETOROLAC TROMETHAMINE 30 MG/ML IJ SOLN
INTRAMUSCULAR | Status: AC
  Filled 2020-05-29: qty 1

## 2020-05-29 MED ORDER — FENTANYL CITRATE (PF) 250 MCG/5ML IJ SOLN
INTRAMUSCULAR | Status: AC
  Filled 2020-05-29: qty 5

## 2020-05-29 MED ORDER — OXYCODONE HCL 5 MG PO TABS
5.0000 mg | ORAL_TABLET | Freq: Once | ORAL | Status: AC | PRN
  Administered 2020-05-29: 5 mg via ORAL

## 2020-05-29 MED ORDER — FENTANYL CITRATE (PF) 100 MCG/2ML IJ SOLN: 25.0000 ug | INTRAMUSCULAR | Status: DC | PRN

## 2020-05-29 MED ORDER — MIDAZOLAM HCL 2 MG/2ML IJ SOLN
INTRAMUSCULAR | Status: AC
  Filled 2020-05-29: qty 2

## 2020-05-29 MED ORDER — FENTANYL CITRATE (PF) 100 MCG/2ML IJ SOLN
INTRAMUSCULAR | Status: DC | PRN
Start: 2020-05-29 — End: 2020-05-29
  Administered 2020-05-29 (×5): 50 ug via INTRAVENOUS

## 2020-05-29 MED ORDER — KETOROLAC TROMETHAMINE 30 MG/ML IJ SOLN
INTRAMUSCULAR | Status: DC | PRN
  Administered 2020-05-29: 30 mg via INTRAVENOUS

## 2020-05-29 MED ORDER — OXYCODONE-ACETAMINOPHEN 5-325 MG PO TABS
1.0000 | ORAL_TABLET | ORAL | 0 refills | Status: DC | PRN
Start: 2020-05-29 — End: 2020-07-16

## 2020-05-29 MED ORDER — PROPOFOL 10 MG/ML IV BOLUS
INTRAVENOUS | Status: DC | PRN
  Administered 2020-05-29: 170 mg via INTRAVENOUS

## 2020-05-29 MED ORDER — DEXAMETHASONE SODIUM PHOSPHATE 10 MG/ML IJ SOLN
INTRAMUSCULAR | Status: DC | PRN
  Administered 2020-05-29: 10 mg via INTRAVENOUS

## 2020-05-29 MED ORDER — OXYCODONE HCL 5 MG/5ML PO SOLN: 5.0000 mg | Freq: Once | ORAL | Status: AC | PRN

## 2020-05-29 MED ORDER — PROPOFOL 10 MG/ML IV BOLUS
INTRAVENOUS | Status: AC
  Filled 2020-05-29: qty 40

## 2020-05-29 MED ORDER — DEXAMETHASONE SODIUM PHOSPHATE 10 MG/ML IJ SOLN
INTRAMUSCULAR | Status: AC
  Filled 2020-05-29: qty 1

## 2020-05-29 MED ORDER — IBUPROFEN 800 MG PO TABS: 800.0000 mg | ORAL_TABLET | Freq: Four times a day (QID) | ORAL | 0 refills | Status: DC | PRN

## 2020-05-29 MED ORDER — LIDOCAINE 2% (20 MG/ML) 5 ML SYRINGE
INTRAMUSCULAR | Status: AC
  Filled 2020-05-29: qty 5

## 2020-05-29 MED ORDER — ROCURONIUM BROMIDE 10 MG/ML (PF) SYRINGE
PREFILLED_SYRINGE | INTRAVENOUS | Status: AC
  Filled 2020-05-29: qty 10

## 2020-05-29 MED ORDER — POVIDONE-IODINE 10 % EX SWAB: 2.0000 "application " | Freq: Once | CUTANEOUS | Status: DC

## 2020-05-29 MED ORDER — SUGAMMADEX SODIUM 200 MG/2ML IV SOLN
INTRAVENOUS | Status: DC | PRN
  Administered 2020-05-29: 300 mg via INTRAVENOUS

## 2020-05-29 MED ORDER — LIDOCAINE HCL 1 % IJ SOLN
INTRAMUSCULAR | Status: DC | PRN
  Administered 2020-05-29: 10 mL

## 2020-05-29 MED ORDER — LACTATED RINGERS IV SOLN: INTRAVENOUS | Status: DC

## 2020-05-29 MED ORDER — BUPIVACAINE HCL (PF) 0.25 % IJ SOLN
INTRAMUSCULAR | Status: DC | PRN
  Administered 2020-05-29: 4 mL

## 2020-05-29 MED ORDER — ACETAMINOPHEN 10 MG/ML IV SOLN
INTRAVENOUS | Status: AC
  Filled 2020-05-29: qty 100

## 2020-05-29 MED ORDER — ACETAMINOPHEN 10 MG/ML IV SOLN
INTRAVENOUS | Status: DC | PRN
  Administered 2020-05-29: 1000 mg via INTRAVENOUS

## 2020-05-29 MED ORDER — SODIUM CHLORIDE 0.9 % IR SOLN
Status: DC | PRN
  Administered 2020-05-29: 3000 mL

## 2020-05-29 MED ORDER — PROMETHAZINE HCL 25 MG/ML IJ SOLN: 6.2500 mg | INTRAMUSCULAR | Status: DC | PRN

## 2020-05-29 MED FILL — OXYCODONE-APAP 5-325MG: 5-325 | 3 days supply | Qty: 20 | Fill #0

## 2020-05-29 MED FILL — IBUPROFEN 800 MG TAB: 800 | 7 days supply | Qty: 30 | Fill #0

## 2020-05-29 SURGICAL SUPPLY — 57 items
ABLATOR SURESOUND NOVASURE (ABLATOR) ×2 IMPLANT
ADH SKN CLS APL DERMABOND .7 (GAUZE/BANDAGES/DRESSINGS) ×2
APL SKNCLS STERI-STRIP NONHPOA (GAUZE/BANDAGES/DRESSINGS)
BENZOIN TINCTURE PRP APPL 2/3 (GAUZE/BANDAGES/DRESSINGS) IMPLANT
BIPOLAR CUTTING LOOP 21FR (ELECTRODE)
CANISTER SUCT 3000ML PPV (MISCELLANEOUS) IMPLANT
CATH ROBINSON RED A/P 16FR (CATHETERS) ×4 IMPLANT
CLIP FILSHIE TUBAL LIGA STRL (Clip) ×2 IMPLANT
CLOSURE WOUND 1/2 X4 (GAUZE/BANDAGES/DRESSINGS)
CLOSURE WOUND 1/4X4 (GAUZE/BANDAGES/DRESSINGS)
COVER MAYO STAND STRL (DRAPES) ×4 IMPLANT
COVER WAND RF STERILE (DRAPES) ×4 IMPLANT
DERMABOND ADVANCED (GAUZE/BANDAGES/DRESSINGS) ×2
DERMABOND ADVANCED .7 DNX12 (GAUZE/BANDAGES/DRESSINGS) IMPLANT
DILATOR CANAL MILEX (MISCELLANEOUS) IMPLANT
DRAPE WARM FLUID 44X44 (DRAPES) ×2 IMPLANT
DRSG COVADERM PLUS 2X2 (GAUZE/BANDAGES/DRESSINGS) IMPLANT
DRSG OPSITE POSTOP 3X4 (GAUZE/BANDAGES/DRESSINGS) ×2 IMPLANT
DRSG TEGADERM 4X4.75 (GAUZE/BANDAGES/DRESSINGS) IMPLANT
DURAPREP 26ML APPLICATOR (WOUND CARE) ×4 IMPLANT
ELECT REM PT RETURN 9FT ADLT (ELECTROSURGICAL) ×4
ELECTRODE REM PT RTRN 9FT ADLT (ELECTROSURGICAL) IMPLANT
GAUZE 4X4 16PLY RFD (DISPOSABLE) ×4 IMPLANT
GLOVE BIO SURGEON STRL SZ 6 (GLOVE) ×4 IMPLANT
GLOVE BIOGEL PI IND STRL 6 (GLOVE) ×4 IMPLANT
GLOVE BIOGEL PI INDICATOR 6 (GLOVE) ×4
GOWN STRL REUS W/ TWL LRG LVL3 (GOWN DISPOSABLE) ×6 IMPLANT
GOWN STRL REUS W/TWL LRG LVL3 (GOWN DISPOSABLE) ×16 IMPLANT
IV NS IRRIG 3000ML ARTHROMATIC (IV SOLUTION) ×2 IMPLANT
KIT PROCEDURE FLUENT (KITS) ×2 IMPLANT
KIT TURNOVER CYSTO (KITS) ×4 IMPLANT
LOOP CUTTING BIPOLAR 21FR (ELECTRODE) IMPLANT
NEEDLE INSUFFLATION 120MM (ENDOMECHANICALS) ×4 IMPLANT
NS IRRIG 500ML POUR BTL (IV SOLUTION) ×4 IMPLANT
PACK LAPAROSCOPY BASIN (CUSTOM PROCEDURE TRAY) ×4 IMPLANT
PACK TRENDGUARD 450 HYBRID PRO (MISCELLANEOUS) IMPLANT
PACK VAGINAL MINOR WOMEN LF (CUSTOM PROCEDURE TRAY) ×4 IMPLANT
PAD OB MATERNITY 4.3X12.25 (PERSONAL CARE ITEMS) ×4 IMPLANT
PENCIL SMOKE EVACUATOR (MISCELLANEOUS) IMPLANT
SCISSORS LAP 5X35 DISP (ENDOMECHANICALS) IMPLANT
SET GENESYS HTA PROCERVA (MISCELLANEOUS) ×2 IMPLANT
SET SUCTION IRRIG HYDROSURG (IRRIGATION / IRRIGATOR) IMPLANT
SET TUBE SMOKE EVAC HIGH FLOW (TUBING) ×4 IMPLANT
SPONGE GAUZE 2X2 8PLY STER LF (GAUZE/BANDAGES/DRESSINGS)
SPONGE GAUZE 2X2 8PLY STRL LF (GAUZE/BANDAGES/DRESSINGS) IMPLANT
STRIP CLOSURE SKIN 1/2X4 (GAUZE/BANDAGES/DRESSINGS) IMPLANT
STRIP CLOSURE SKIN 1/4X4 (GAUZE/BANDAGES/DRESSINGS) IMPLANT
SUT MNCRL AB 3-0 PS2 18 (SUTURE) ×4 IMPLANT
SUT VICRYL 0 UR6 27IN ABS (SUTURE) ×4 IMPLANT
TOWEL OR 17X26 10 PK STRL BLUE (TOWEL DISPOSABLE) ×4 IMPLANT
TRENDGUARD 450 HYBRID PRO PACK (MISCELLANEOUS) ×4
TROCAR BLADELESS OPT 5 100 (ENDOMECHANICALS) ×2 IMPLANT
TROCAR XCEL NON-BLD 11X100MML (ENDOMECHANICALS) ×4 IMPLANT
TUBE CONNECTING 12'X1/4 (SUCTIONS)
TUBE CONNECTING 12X1/4 (SUCTIONS) IMPLANT
WARMER LAPAROSCOPE (MISCELLANEOUS) ×2 IMPLANT
WATER STERILE IRR 500ML POUR (IV SOLUTION) ×2 IMPLANT

## 2020-05-29 NOTE — Discharge Instructions (Signed)
Call MD for T>100.4, heavy vaginal bleeding, severe abdominal pain, or respiratory distress.  Call office to schedule postop visit in 2 weeks.  Pelvic rest x 6 weeks.  No driving while taking narcotics.  DISCHARGE INSTRUCTIONS: Laparoscopy  The following instructions have been prepared to help you care for yourself upon your return home today.  Wound care: Marland Kitchen Do not get the incision wet for the first 24 hours. The incision should be kept clean and dry. . The Band-Aids or dressings may be removed the day after surgery. . Should the incision become sore, red, and swollen after the first week, check with your doctor.  Personal hygiene: . Shower the day after your procedure.  Activity and limitations: . Do NOT drive or operate any equipment today. . Do NOT lift anything more than 15 pounds for 2-3 weeks after surgery. . Do NOT rest in bed all day. . Walking is encouraged. Walk each day, starting slowly with 5-minute walks 3 or 4 times a day. Slowly increase the length of your walks. . Walk up and down stairs slowly. . Do NOT do strenuous activities, such as golfing, playing tennis, bowling, running, biking, weight lifting, gardening, mowing, or vacuuming for 2-4 weeks. Ask your doctor when it is okay to start.  Diet: Eat a light meal as desired this evening. You may resume your usual diet tomorrow.  Return to work: This is dependent on the type of work you do. For the most part you can return to a desk job within a week of surgery. If you are more active at work, please discuss this with your doctor.  What to expect after your surgery: You may have a slight burning sensation when you urinate on the first day. You may have a very small amount of blood in the urine. Expect to have a small amount of vaginal discharge/light bleeding for 1-2 weeks. It is not unusual to have abdominal soreness and bruising for up to 2 weeks. You may be tired and need more rest for about 1 week. You may experience  shoulder pain for 24-72 hours. Lying flat in bed may relieve it.  Call your doctor for any of the following: . Develop a fever of 100.4 or greater . Inability to urinate 6 hours after discharge from hospital . Severe pain not relieved by pain medications . Persistent of heavy bleeding at incision site . Redness or swelling around incision site after a week . Increasing nausea or vomiting   Post Anesthesia Home Care Instructions  Activity: Get plenty of rest for the remainder of the day. A responsible individual must stay with you for 24 hours following the procedure.  For the next 24 hours, DO NOT: -Drive a car -Advertising copywriter -Drink alcoholic beverages -Take any medication unless instructed by your physician -Make any legal decisions or sign important papers.  Meals: Start with liquid foods such as gelatin or soup. Progress to regular foods as tolerated. Avoid greasy, spicy, heavy foods. If nausea and/or vomiting occur, drink only clear liquids until the nausea and/or vomiting subsides. Call your physician if vomiting continues.  Special Instructions/Symptoms: Your throat may feel dry or sore from the anesthesia or the breathing tube placed in your throat during surgery. If this causes discomfort, gargle with warm salt water. The discomfort should disappear within 24 hours.  If you had a scopolamine patch placed behind your ear for the management of post- operative nausea and/or vomiting:  1. The medication in the patch is effective  for 72 hours, after which it should be removed.  Wrap patch in a tissue and discard in the trash. Wash hands thoroughly with soap and water. 2. You may remove the patch earlier than 72 hours if you experience unpleasant side effects which may include dry mouth, dizziness or visual disturbances. 3. Avoid touching the patch. Wash your hands with soap and water after contact with the patch.    No additional Tylenol/acetaminophen until after 1:45 pm  today if needed.

## 2020-05-29 NOTE — Anesthesia Postprocedure Evaluation (Signed)
Anesthesia Post Note  Patient: HOPE BRANDENBURGER  Procedure(s) Performed: DILATATION & CURETTAGE/HYSTEROSCOPY WITH NOVASURE ABLATION (N/A ) BILATERAL LAPAROSCOPIC TUBAL LIGATION WITH FILSHIE CLIPS (Bilateral )     Patient location during evaluation: PACU Anesthesia Type: General Level of consciousness: awake and alert Pain management: pain level controlled Vital Signs Assessment: post-procedure vital signs reviewed and stable Respiratory status: spontaneous breathing, nonlabored ventilation and respiratory function stable Cardiovascular status: blood pressure returned to baseline and stable Postop Assessment: no apparent nausea or vomiting Anesthetic complications: no   No complications documented.  Last Vitals:  Vitals:   05/29/20 0900 05/29/20 0951  BP: (!) 143/88 (!) 137/93  Pulse: 92 70  Resp: 14 16  Temp: 36.8 C 36.8 C  SpO2: 100% 99%    Last Pain:  Vitals:   05/29/20 0951  TempSrc:   PainSc: 4                  Beryle Lathe

## 2020-05-29 NOTE — Transfer of Care (Signed)
°  Last Vitals:  Vitals Value Taken Time  BP 137/90 05/29/20 0833  Temp 36.8 C 05/29/20 0833  Pulse 94 05/29/20 0834  Resp 13 05/29/20 0834  SpO2 100 % 05/29/20 0834  Vitals shown include unvalidated device data.  Last Pain:  Vitals:   05/29/20 0534  TempSrc: Oral  PainSc: 0-No pain      Patients Stated Pain Goal: 4 (05/29/20 0534)  Immediate Anesthesia Transfer of Care Note  Patient: Alison Nichols  Procedure(s) Performed: Procedure(s) (LRB): DILATATION & CURETTAGE/HYSTEROSCOPY WITH NOVASURE ABLATION (N/A) BILATERAL LAPAROSCOPIC TUBAL LIGATION WITH FILSHIE CLIPS (Bilateral)  Patient Location: PACU  Anesthesia Type: General  Level of Consciousness: awake, alert  and oriented  Airway & Oxygen Therapy: Patient Spontanous Breathing and Patient connected to nasal cannula oxygen  Post-op Assessment: Report given to PACU RN and Post -op Vital signs reviewed and stable  Post vital signs: Reviewed and stable  Complications: No apparent anesthesia complications

## 2020-05-29 NOTE — Anesthesia Procedure Notes (Signed)
Procedure Name: Intubation Date/Time: 05/29/2020 7:31 AM Performed by: Mechele Claude, CRNA Pre-anesthesia Checklist: Patient identified, Emergency Drugs available, Suction available and Patient being monitored Patient Re-evaluated:Patient Re-evaluated prior to induction Oxygen Delivery Method: Circle system utilized Preoxygenation: Pre-oxygenation with 100% oxygen Induction Type: IV induction Ventilation: Mask ventilation without difficulty Laryngoscope Size: Mac and 3 Grade View: Grade I Tube type: Oral Tube size: 7.0 mm Number of attempts: 1 Airway Equipment and Method: Stylet and Oral airway Placement Confirmation: ETT inserted through vocal cords under direct vision,  positive ETCO2 and breath sounds checked- equal and bilateral Secured at: 22 cm Tube secured with: Tape Dental Injury: Teeth and Oropharynx as per pre-operative assessment

## 2020-05-29 NOTE — Op Note (Signed)
Lindyn Vossler 05/29/2020  PREOPERATIVE DIAGNOSIS:Desire for sterility, heavy menstrual bleeding  POSTOPERATIVE DIAGNOSIS:SAA  PROCEDURE:Removal of Paragard, Laparoscopic Bilateral Tubal Ligation with Filshie Clips, Hysteroscopy, Dilation and Curettage, Novasure  ANESTHESIA: General endotracheal  COMPLICATIONS: None immediate.  ESTIMATED BLOOD LOSS: Less than 20 ml.  PATHOLOGY:  Endometrial curettings  INDICATIONS:34 [id number] with desired sterility and heavy menstrual bleeding   FINDINGS:Normal uterus, bilateral fallopian tubes and ovaries.   Normal internal uterine anatomy.  TECHNIQUE: The patient was taken to the operating room where general anesthesia was obtained without difficulty. She was then placed in the dorsal lithotomy position and prepared and draped in sterile fashion. After an adequate timeout was performed, a bivalved speculum was then placed in the patient's vagina.  Paragard was removed.  The anterior lip of cervix grasped with the single-tooth tenaculum. The acorn uterine manipulator was advanced into the uterus. The speculum was removed from the vagina.  Attention was then turned to the patient's abdomen where a 10-mm skin incision was made on the umbilical fold. The Veress needle was carefully introduced into the peritoneal cavity through the abdominal wall. Intraperitoneal placement was confirmed by drop in intraabdominal pressure with insufflation of carbon dioxide gas. Adequate pneumoperitoneum was obtained, and the 10 mmtrocar was then advanced without difficulty into the abdomen where intraabdominal placement was confirmed by the operative laparoscope.  Filshie clip was advanced and placed across the left tube ensuring the clip completely encompassed the entire tube diameter approximately 3 cm from the cornual portion of the tube. The same was performed on the right fallopian tube.Hemostasis was observed. All instruments were  removed from the abdomen. The infraumbilical fascial incision was closed with 0 vicryl in a figure of eight stitch. Skin incision was closed with 3-0 monocryl in a subcuticular fashion.   Attention was then turned to the vaginal portion of the case.  The uterine manipulator was removed from the vagina without complications.  10cc 1% lidocaine was used for paracervical block.  The uterus was sounded to 8 cm.  The cervix was dilated to accomodate diagnostic hysteroscopy.  Once the cervix was dilated, the hysteroscope was inserted under direct visualization using saline as a suspension medium.  The uterine cavity was carefully examined, both ostia were recognized.   After further careful visualization of the uterine cavity, the hysteroscope was removed under direct visualization; cervical length measured to be 3.5 cm using the scope as a ruler.  A sharp curettage was then performed to obtain a small amount of endometrial curettings.  The Novasure was calibrated to 4.5 cm cavity length.  The Novasure was placed inside the uterus and seated.  Cavity width 3.7 cm.  Test run was performed without complicated and then cycle was started.  The total length of the cycle was 94 seconds at power of 92 W.  The Novasure was removed.  The tenaculum was removed from the anterior lip of the cervix and the vaginal speculum was removed after noting good hemostasis.  The patient tolerated the procedure well and was taken to the recovery area awake, extubated and in stable condition.  The patient tolerated the procedure well. Sponge, lap, and needle counts were correct times two. The patient was then taken to the recovery room awake, extubated and in stable in stable condition.

## 2020-05-29 NOTE — Progress Notes (Signed)
No change to H&P.  Alison Liguori, DO 

## 2020-06-02 ENCOUNTER — Encounter (HOSPITAL_BASED_OUTPATIENT_CLINIC_OR_DEPARTMENT_OTHER): Payer: Self-pay | Admitting: Obstetrics & Gynecology

## 2020-06-02 LAB — SURGICAL PATHOLOGY

## 2020-06-06 ENCOUNTER — Other Ambulatory Visit (HOSPITAL_COMMUNITY): Payer: Self-pay | Admitting: Obstetrics & Gynecology

## 2020-06-06 MED FILL — NITROFURANTOIN MONO-MCR 100: 100 | 7 days supply | Qty: 14 | Fill #0

## 2020-06-12 ENCOUNTER — Telehealth: Payer: No Typology Code available for payment source | Admitting: Orthopedic Surgery

## 2020-06-12 DIAGNOSIS — J029 Acute pharyngitis, unspecified: Secondary | ICD-10-CM

## 2020-06-12 MED ORDER — AMOXICILLIN 500 MG PO CAPS: 500.0000 mg | ORAL_CAPSULE | Freq: Two times a day (BID) | ORAL | 0 refills | Status: AC

## 2020-06-12 MED ORDER — LIDOCAINE VISCOUS HCL 2 % MT SOLN: 5.0000 mL | OROMUCOSAL | 0 refills | Status: DC | PRN

## 2020-06-12 NOTE — Progress Notes (Signed)
We are sorry that you are not feeling well.  Here is how we plan to help!  Based on what you have shared with me it is likely that you have strep pharyngitis.  Strep pharyngitis is inflammation and infection in the back of the throat.  This is an infection cause by bacteria and is treated with antibiotics.  I have prescribed Amoxicillin 500 mg twice a day for 10 days and 2% Viscous Lidocaine 5 ml gargle and swallow every 4 hours as needed for throat pain. For throat pain, we recommend over the counter oral pain relief medications such as acetaminophen or aspirin, or anti-inflammatory medications such as ibuprofen or naproxen sodium. Topical treatments such as oral throat lozenges or sprays may be used as needed. Strep infections are not as easily transmitted as other respiratory infections, however we still recommend that you avoid close contact with loved ones, especially the very young and elderly.  Remember to wash your hands thoroughly throughout the day as this is the number one way to prevent the spread of infection and wipe down door knobs and counters with disinfectant.   Home Care:  Only take medications as instructed by your medical team.  Complete the entire course of an antibiotic.  Do not take these medications with alcohol.  A steam or ultrasonic humidifier can help congestion.  You can place a towel over your head and breathe in the steam from hot water coming from a faucet.  Avoid close contacts especially the very young and the elderly.  Cover your mouth when you cough or sneeze.  Always remember to wash your hands.  Get Help Right Away If:  You develop worsening fever or sinus pain.  You develop a severe head ache or visual changes.  Your symptoms persist after you have completed your treatment plan.  Make sure you  Understand these instructions.  Will watch your condition.  Will get help right away if you are not doing well or get worse.  Your e-visit answers  were reviewed by a board certified advanced clinical practitioner to complete your personal care plan.  Depending on the condition, your plan could have included both over the counter or prescription medications.  If there is a problem please reply  once you have received a response from your provider.  Your safety is important to [place].  If you have drug allergies check your prescription carefully.    You can use MyChart to ask questions about today's visit, request a non-urgent call back, or ask for a work or school excuse for 24 hours related to this e-Visit. If it has been greater than 24 hours you will need to follow up with your provider, or enter a new e-Visit to address those concerns.  You will get an e-mail in the next two days asking about your experience.  I hope that your e-visit has been valuable and will speed your recovery. Thank you for using e-visits.  Greater than 5 minutes, yet less than 10 minutes of time have been spent researching, coordinating and implementing care for this patient today.

## 2020-06-20 ENCOUNTER — Other Ambulatory Visit: Payer: Self-pay | Admitting: Family Medicine

## 2020-06-25 ENCOUNTER — Other Ambulatory Visit: Payer: Self-pay | Admitting: Family Medicine

## 2020-06-25 ENCOUNTER — Telehealth: Payer: Self-pay | Admitting: Family Medicine

## 2020-06-25 MED FILL — BACLOFEN 10 MG TABS: 10 | 90 days supply | Qty: 90 | Fill #0

## 2020-06-25 MED FILL — SERTRALINE HCL 100 MG TABS: 100 | 90 days supply | Qty: 90 | Fill #0

## 2020-06-25 NOTE — Telephone Encounter (Signed)
Patient called me wanting to cancel her 11/9 Botox appointment due to the cost. I asked the patient if she is enrolled with the Botox Savings program. Patient states she is not. I explained the process. I advised the patient that I would sign her up and upload the EOB from her 03/25/20 appointment so that she can be reimbursed.

## 2020-06-26 ENCOUNTER — Other Ambulatory Visit: Payer: Self-pay | Admitting: Neurology

## 2020-06-26 MED ORDER — TOPIRAMATE 50 MG PO TABS: 50.0000 mg | ORAL_TABLET | Freq: Every evening | ORAL | 3 refills | Status: DC

## 2020-06-26 MED FILL — TOPIRAMATE 50 MG TABLET: 50 | 90 days supply | Qty: 90 | Fill #0

## 2020-07-01 ENCOUNTER — Ambulatory Visit: Payer: Self-pay | Admitting: Family Medicine

## 2020-07-08 ENCOUNTER — Other Ambulatory Visit: Payer: Self-pay | Admitting: Family Medicine

## 2020-07-08 MED ORDER — AMPHETAMINE-DEXTROAMPHET ER 30 MG PO CP24
30.0000 mg | ORAL_CAPSULE | Freq: Every day | ORAL | 0 refills | Status: DC
Start: 2020-07-08 — End: 2020-12-29

## 2020-07-09 MED FILL — ADDERALL XR 30 MG CAP SA: 30 | 30 days supply | Qty: 30 | Fill #0

## 2020-07-15 DIAGNOSIS — G901 Familial dysautonomia [Riley-Day]: Secondary | ICD-10-CM | POA: Insufficient documentation

## 2020-07-16 ENCOUNTER — Ambulatory Visit: Payer: No Typology Code available for payment source | Admitting: Internal Medicine

## 2020-07-16 ENCOUNTER — Encounter: Payer: Self-pay | Admitting: Internal Medicine

## 2020-07-16 ENCOUNTER — Other Ambulatory Visit: Payer: Self-pay

## 2020-07-16 DIAGNOSIS — G901 Familial dysautonomia [Riley-Day]: Secondary | ICD-10-CM

## 2020-07-16 NOTE — Patient Instructions (Addendum)
Medication Instructions:  You have been given 3 different prescriptions to try. You may take them in any order. Please allow at least 2 weeks on each medication before changing.  DO NOT take more than one of these prescriptions at a time.    1) Metoprolol Succinate 50 mg once daily--Start off taking a half tablet at night.  2) Atenolol 50 mg once daily--Start off taking a half tablet at night.  3) Bisoprolol 5 mg once daily--Start off taking a half tablet at night.   *If you need a refill on your cardiac medications before your next appointment, please call your pharmacy*   Lab Work: None  If you have labs (blood work) drawn today and your tests are completely normal, you will receive your results only by: Marland Kitchen MyChart Message (if you have MyChart) OR . A paper copy in the mail If you have any lab test that is abnormal or we need to change your treatment, we will call you to review the results.   Testing/Procedures: None   Follow-Up: At [facility], you and your health needs are our priority.  As part of our continuing mission to provide you with exceptional heart care, we have created designated Provider Care Teams.  These Care Teams include your primary Cardiologist (physician) and Advanced Practice Providers (APPs -  Physician Assistants and Nurse Practitioners) who all work together to provide you with the care you need, when you need it.  We recommend signing up for the patient portal called "MyChart".  Sign up information is provided on this After Visit Summary.  MyChart is used to connect with patients for Virtual Visits (Telemedicine).  Patients are able to view lab/test results, encounter notes, upcoming appointments, etc.  Non-urgent messages can be sent to your provider as well.   To learn more about what you can do with MyChart, go to [email or web address].    Your next appointment:   6 month(s)  The format for your next appointment:   In Person  Provider:   You may  see Berton Mount, MD or one of the following Advanced Practice Providers on your designated Care Team:    Gypsy Balsam, NP  Francis Dowse, [place]  Casimiro Needle "Mardelle Matte" [place], [place]    Other Instructions None

## 2020-07-16 NOTE — Progress Notes (Signed)
Patient Care Team: Orland Mustard, MD as PCP - General (Family Medicine) Mitchel Honour, DO as Consulting Physician (Obstetrics and Gynecology)   HPI  Alison Nichols is a 34 y.o. female seen in follow-up for palpitations occurring in the context of elevated blood pressure and joint laxity as well as anxiety for which she is taking sertraline.  No significant interval change.  There have been times where she has pushed her fluids with some modest improvement.  She has also undergone intrauterine ablation for metromenorrhagia.  She awaits to see its benefits.  Continues with exertional tachypalpitations and dyspnea.  She also has palpitations where in her heart rate recorded by her apple watch has been 200 bpm.  She recently upgraded her apple watch.  (See below)   DATE TEST EF   5/21 Echo  60-65 %         Date Cr K Hgb  12/20 0.77 4.0 15.9 (<<10.4 **7/19)  10/21    14.3      Records and Results Reviewed   Past Medical History:  Diagnosis Date  . Anxiety   . Asthma   . Complication of anesthesia   . COVID-19 01/2019   scrachy throat x few days all symptoms resolved  . Depression   . Ehlers-Danlos disease   . Headache   . History of gestational hypertension 2019  . IBS (irritable bowel syndrome)   . Migraines   . PONV (postoperative nausea and vomiting)   . Scoliosis   . Vaginal Pap smear, abnormal     Past Surgical History:  Procedure Laterality Date  . CHOLECYSTECTOMY  2002  . COLPOSCOPY  2004  . COMBINED HYSTEROSCOPY DIAGNOSTIC / D&C  2004  . DILITATION & CURRETTAGE/HYSTROSCOPY WITH HYDROTHERMAL ABLATION N/A 05/29/2020   Procedure: DILATATION & CURETTAGE/HYSTEROSCOPY WITH NOVASURE ABLATION;  Surgeon: Mitchel Honour, DO;  Location: [facility];  Service: Gynecology;  Laterality: N/A;  . HYSTEROSCOPY WITH D & C  2004  . KNEE ARTHROSCOPY Right 1998 & 1999  . LAPAROSCOPIC TUBAL LIGATION Bilateral 05/29/2020   Procedure: BILATERAL  LAPAROSCOPIC TUBAL LIGATION WITH FILSHIE CLIPS;  Surgeon: Mitchel Honour, DO;  Location: [facility];  Service: Gynecology;  Laterality: Bilateral;  . sinus sugrery  1998  . TONSILLECTOMY  as child   w/ Adenoidectomy    Current Meds  Medication Sig  . albuterol (VENTOLIN HFA) 108 (90 Base) MCG/ACT inhaler Inhale 2 puffs into the lungs every 6 (six) hours as needed for wheezing or shortness of breath.  . amphetamine-dextroamphetamine (ADDERALL XR) 30 MG 24 hr capsule Take 1 capsule (30 mg total) by mouth daily.  . baclofen (LIORESAL) 10 MG tablet Take 1 tablet (10 mg total) by mouth at bedtime.  . hydrOXYzine (ATARAX/VISTARIL) 25 MG tablet Take 1 tablet (25 mg total) by mouth at bedtime as needed for itching.  Marland Kitchen ibuprofen (ADVIL) 800 MG tablet Take 1 tablet (800 mg total) by mouth every 6 (six) hours as needed.  . nystatin-triamcinolone ointment (MYCOLOG) Apply 1 application topically 2 (two) times daily.  . sertraline (ZOLOFT) 100 MG tablet Take 1 tablet (100 mg total) by mouth at bedtime.  . SUMAtriptan (IMITREX) 100 MG tablet TAKE 1 TABLET BY MOUTH AT ONSET OF HEADACHE, MAY REPEAT IN 2 HOURS IF HEADACHE PERSISTS OR RECURS. NO MORE THAN 2 TABS IN 24 HOURS  . topiramate (TOPAMAX) 50 MG tablet Take 1 tablet (50 mg total) by mouth at bedtime.    Allergies  Allergen Reactions  . Cephalexin Hives    Keflex is hives      Review of Systems negative except from HPI and PMH  Physical Exam BP 132/88   Pulse 98   Ht 5\' 6"  (1.676 m)   Wt 157 lb (71.2 kg)   SpO2 98%   BMI 25.34 kg/m  Well developed and well nourished in no acute distress HENT normal E scleral and icterus clear Neck Supple JVP flat; carotids brisk and full Clear to ausculation Regular rate and rhythm, no murmurs gallops or rub Soft with active bowel sounds No clubbing cyanosis  Edema Alert and oriented, grossly normal motor and sensory function Skin Warm and Dry   CrCl cannot be calculated  (Patient's most recent lab result is older than the maximum 21 days allowed.).   Assessment and  Plan Heat intolerance  Joint Laxity syndrome  Elevated blood pressure  Metromenorrhagia  Tachypalpitations   The patient continues with tachypalpitations and exercise intolerance.  With her modestly elevated blood pressure we will try her on beta-blockers.  Have given her prescription for atenolol 50, metoprolol succinate 50 and bisoprolol 5.  She will start on half dose at night and see if she is able to tolerate any of them and if any of them improves her symptoms.  She also has trouble tachypalpitations recorded in question heart rates 200.  She is just upgraded her apple watch and so she has ECG algorithm which will use to try to elucidate the mechanism of her tachycardia.  Urged her to pursue exercise particularly prior to the return of the heat.  Suggested he consider home exercise equipment so as to minimize the time requirement.    Current medicines are reviewed at length with the patient today .  The patient does not have concerns regarding medicines.

## 2020-08-04 ENCOUNTER — Other Ambulatory Visit: Payer: Self-pay

## 2020-08-04 ENCOUNTER — Other Ambulatory Visit: Payer: Self-pay | Admitting: Family Medicine

## 2020-08-04 ENCOUNTER — Encounter: Payer: Self-pay | Admitting: Family Medicine

## 2020-08-04 ENCOUNTER — Ambulatory Visit (INDEPENDENT_AMBULATORY_CARE_PROVIDER_SITE_OTHER): Payer: No Typology Code available for payment source | Admitting: Family Medicine

## 2020-08-04 VITALS — BP 110/80 | HR 85 | Temp 98.3°F | Ht 66.0 in | Wt 157.2 lb

## 2020-08-04 DIAGNOSIS — F900 Attention-deficit hyperactivity disorder, predominantly inattentive type: Secondary | ICD-10-CM

## 2020-08-04 MED ORDER — LISDEXAMFETAMINE DIMESYLATE 30 MG PO CAPS: 30.0000 mg | ORAL_CAPSULE | Freq: Every day | ORAL | 0 refills | Status: DC

## 2020-08-04 MED FILL — VYVANSE 30 MG CAPSULE: 30 | 30 days supply | Qty: 30 | Fill #0

## 2020-08-04 NOTE — Patient Instructions (Signed)
-  vyvanse 30mg  sent in. Let me know if too expensive and we can try concerta.  -f/u in 3 months and lets do annual/fasting labs/depression f/u.   Merry christmas! aw

## 2020-08-04 NOTE — Progress Notes (Signed)
Patient: Alison Nichols MRN: [id number] DOB: 01-10-1986 PCP: Orland Mustard, MD     Subjective:  Chief Complaint  Patient presents with  . ADHD    HPI: The patient is a 34 y.o. female who presents today for ADHD. She would like to discuss switching medication. She is currently on Adderall Xr 30mg  daily. She is interested in vyvanse to see if we can help her heart rate from her POTS. Her cardiologist wants her to try a beta blocker as well, but she has not started this.   She takes medication appropriately. Does struggle with migraines and is followed by neurology. Just did botox and thinks this is helpful. Sleeping okay, no nausea/vomting or stomach aches.   Review of Systems  Constitutional: Negative for chills, fatigue and fever.  Respiratory: Negative for shortness of breath and wheezing.   Musculoskeletal: Negative for back pain.  Neurological: Negative for dizziness and headaches.  Psychiatric/Behavioral: Negative for decreased concentration, dysphoric mood and sleep disturbance.    Allergies Patient is allergic to cephalexin.  Past Medical History Patient  has a past medical history of Anxiety, Asthma, Complication of anesthesia, COVID-19 (01/2019), Depression, Ehlers-Danlos disease, Headache, History of gestational hypertension (2019), IBS (irritable bowel syndrome), Migraines, PONV (postoperative nausea and vomiting), Scoliosis, and Vaginal Pap smear, abnormal.  Surgical History Patient  has a past surgical history that includes Hysteroscopy with D & C (2004); Knee arthroscopy (Right, 1998 & 1999); Colposcopy (2004); Combined hysteroscopy diagnostic / D&C (2004); Tonsillectomy (as child); Cholecystectomy (2002); sinus sugrery (1998); Dilatation & currettage/hysteroscopy with hydrothermal ablation (N/A, 05/29/2020); and Laparoscopic tubal ligation (Bilateral, 05/29/2020).  Family History Pateint's family history includes Arthritis in her maternal grandmother; CAD in her  maternal grandmother; Depression in her mother; Epilepsy in her maternal grandfather; Heart attack in her maternal grandmother; Heart failure in her maternal grandmother; Hyperlipidemia in her mother; Hypertension in her maternal grandfather, maternal grandmother, and mother; Kidney disease in her maternal grandfather; Migraines in her maternal grandmother and mother; Thyroid disease in her maternal grandmother.  Social History Patient  reports that she has never smoked. She has never used smokeless tobacco. She reports that she does not drink alcohol and does not use drugs.    Objective: Vitals:   08/04/20 1352  BP: 110/80  Pulse: 85  Temp: 98.3 F (36.8 C)  TempSrc: Temporal  SpO2: 99%  Weight: 157 lb 3.2 oz (71.3 kg)  Height: 5\' 6"  (1.676 m)    Body mass index is 25.37 kg/m.  Physical Exam Vitals reviewed.  Constitutional:      Appearance: Normal appearance. She is normal weight.  HENT:     Head: Normocephalic and atraumatic.  Cardiovascular:     Rate and Rhythm: Normal rate and regular rhythm.     Heart sounds: Normal heart sounds.  Pulmonary:     Effort: Pulmonary effort is normal.     Breath sounds: Normal breath sounds.  Abdominal:     General: Abdomen is flat. Bowel sounds are normal.     Palpations: Abdomen is soft.  Neurological:     General: No focal deficit present.     Mental Status: She is alert and oriented to person, place, and time.  Psychiatric:        Mood and Affect: Mood normal.        Behavior: Behavior normal.        Assessment/plan: 1. ADHD, predominantly inattentive type Will do trial of vyvanse, unsure if insurance will cover and if not then we  can try concerta if needed. Will do 30 day supply and see how she does. PMP website verified and checked. Fills correctly. Will email me in a month to let me know what she thinks. F/u in 3 months with fasting labs/annual/depression and routine adhd.    This visit occurred during the SARS-CoV-2 public  health emergency.  Safety protocols were in place, including screening questions prior to the visit, additional usage of staff PPE, and extensive cleaning of exam room while observing appropriate contact time as indicated for disinfecting solutions.     Return in about 3 months (around 11/02/2020) for adhd .   Orland Mustard, MD [facility] [place]   08/04/2020

## 2020-08-26 MED FILL — SUMATRIPTAN SUCCINATE 100 M: 100 | 30 days supply | Qty: 9 | Fill #1

## 2020-09-05 MED FILL — BACLOFEN 10 MG TABS: 10 | 90 days supply | Qty: 90 | Fill #1

## 2020-09-09 ENCOUNTER — Encounter: Payer: Self-pay | Admitting: Family Medicine

## 2020-09-09 ENCOUNTER — Other Ambulatory Visit: Payer: Self-pay | Admitting: Family Medicine

## 2020-09-09 MED ORDER — LISDEXAMFETAMINE DIMESYLATE 40 MG PO CAPS: 40.0000 mg | ORAL_CAPSULE | ORAL | 0 refills | Status: DC

## 2020-09-09 NOTE — Telephone Encounter (Signed)
See note below

## 2020-09-10 MED FILL — VYVANSE 40 MG CAPSULE: 40 | 30 days supply | Qty: 30 | Fill #0

## 2020-09-11 ENCOUNTER — Telehealth: Payer: No Typology Code available for payment source | Admitting: Family

## 2020-09-11 ENCOUNTER — Other Ambulatory Visit: Payer: Self-pay | Admitting: Family Medicine

## 2020-09-11 DIAGNOSIS — R059 Cough, unspecified: Secondary | ICD-10-CM

## 2020-09-11 MED ORDER — AZITHROMYCIN 250 MG PO TABS: ORAL_TABLET | ORAL | 0 refills | Status: DC

## 2020-09-11 MED ORDER — BENZONATATE 100 MG PO CAPS: 100.0000 mg | ORAL_CAPSULE | Freq: Three times a day (TID) | ORAL | 0 refills | Status: DC | PRN

## 2020-09-11 MED ORDER — ALBUTEROL SULFATE HFA 108 (90 BASE) MCG/ACT IN AERS: 2.0000 | INHALATION_SPRAY | Freq: Four times a day (QID) | RESPIRATORY_TRACT | 0 refills | Status: DC | PRN

## 2020-09-11 MED FILL — ALBUTEROL SULFATE HFA 108 (: 108 (90 BAS | 25 days supply | Qty: 18 | Fill #0

## 2020-09-11 MED FILL — BENZONATATE 100 MG CAPS: 100 | 10 days supply | Qty: 30 | Fill #0

## 2020-09-11 MED FILL — AZITHROMYCIN 250 MG TABLET: 250 | 6 days supply | Qty: 6 | Fill #0

## 2020-09-11 NOTE — Progress Notes (Signed)
We are sorry that you are not feeling well.  Here is how we plan to help!  Based on your presentation I believe you most likely have A cough due to bacteria.  When patients have a fever and a productive cough with a change in color or increased sputum production, we are concerned about bacterial bronchitis.  If left untreated it can progress to pneumonia.  If your symptoms do not improve with your treatment plan it is important that you contact your provider.   I have prescribed Azithromyin 250 mg: two tablets now and then one tablet daily for 4 additonal days    In addition you may use A prescription cough medication called Tessalon Perles 100mg . You may take 1-2 capsules every 8 hours as needed for your cough.  I also refilled your albuterol inhaler. Please use this to help with the spasms you described.   From your responses in the eVisit questionnaire you describe inflammation in the upper respiratory tract which is causing a significant cough.  This is commonly called Bronchitis and has four common causes:    Allergies  Viral Infections  Acid Reflux  Bacterial Infection Allergies, viruses and acid reflux are treated by controlling symptoms or eliminating the cause. An example might be a cough caused by taking certain blood pressure medications. You stop the cough by changing the medication. Another example might be a cough caused by acid reflux. Controlling the reflux helps control the cough.  USE OF BRONCHODILATOR ("RESCUE") INHALERS: There is a risk from using your bronchodilator too frequently.  The risk is that over-reliance on a medication which only relaxes the muscles surrounding the breathing tubes can reduce the effectiveness of medications prescribed to reduce swelling and congestion of the tubes themselves.  Although you feel brief relief from the bronchodilator inhaler, your asthma may actually be worsening with the tubes becoming more swollen and filled with mucus.  This can  delay other crucial treatments, such as oral steroid medications. If you need to use a bronchodilator inhaler daily, several times per day, you should discuss this with your provider.  There are probably better treatments that could be used to keep your asthma under control.     HOME CARE . Only take medications as instructed by your medical team. . Complete the entire course of an antibiotic. . Drink plenty of fluids and get plenty of rest. . Avoid close contacts especially the very young and the elderly . Cover your mouth if you cough or cough into your sleeve. . Always remember to wash your hands . A steam or ultrasonic humidifier can help congestion.   GET HELP RIGHT AWAY IF: . You develop worsening fever. . You become short of breath . You cough up blood. . Your symptoms persist after you have completed your treatment plan MAKE SURE YOU   Understand these instructions.  Will watch your condition.  Will get help right away if you are not doing well or get worse.  Your e-visit answers were reviewed by a board certified advanced clinical practitioner to complete your personal care plan.  Depending on the condition, your plan could have included both over the counter or prescription medications. If there is a problem please reply  once you have received a response from your provider. Your safety is important to .  If you have drug allergies check your prescription carefully.    You can use MyChart to ask questions about today's visit, request a non-urgent call back, or ask  for a work or school excuse for 24 hours related to this e-Visit. If it has been greater than 24 hours you will need to follow up with your provider, or enter a new e-Visit to address those concerns. You will get an e-mail in the next two days asking about your experience.  I hope that your e-visit has been valuable and will speed your recovery. Thank you for using e-visits.  Greater than 5 minutes, yet less than  10 minutes of time have been spent researching, coordinating, and implementing care for this patient.

## 2020-09-12 MED FILL — HYDROXYZINE HCL 25 MG TABS: 25 | 90 days supply | Qty: 90 | Fill #0

## 2020-09-23 NOTE — Progress Notes (Signed)
[facility]    Provider:  Dr Lucia Gaskins Requesting Provider: Orland Mustard, MD Primary Care Provider:  Orland Mustard, MD  CC:  Migraines  Interval history 09/24/2020: Here for a new problem neck pain. PMHx migraines, chiari malformation. She had one injection of botox for migraines in 03/2020 but none since. The botox helped but she is waiting for new insurance. She liked Ajovy. Imitrex works acutely. Migraines are back to their baseline since she is not on anything. She is having a lot of neck pain with the headaches. She did not go to dry needling. She can have severe neck pain, But one day she woke up with her neck she has radiation down the right arm, weakness in the right arm, she has tried conservative measures for > 3 months,   MRI brain normal, personally reviewed images  HPI 12/27/2019:  Alison Nichols is a 35 y.o. female here as requested by Orland Mustard, MD for migraines. PMHx chiari malformation.Started around the age of 23 they were severe and sent her to the ED vomiting, she was on several medications, they improved in her early 56s and then started worsening a few years ago and continue to worsen. They are most often during menses but also throughout the month. Migraines are on the right, she has a lot of neck pain and starts there, they are pulsating/pounding/throbbing, photo/phono nausea vomiting, movement makes them worse and they last 24-72 hours, no medication overuse,  15 migraine days a month, 20 headache days a month for > 1 year that are moderately severe or severe and affecting her life significantly. She tried continuous birth control but anything she tries makes the migraines worse even progesterone only IUD. Her headaches were better with her first 2 pregnanies. Blurry vision with the migraines. She does not have auras except rarely, she has morning headaches, bending over makes her headaches much worse. She wakes often with them and once she gets up they  may get better. They can last 24-72 hours. Imitrex helps. The migraines are moderately severe to severe and affects her life. Sinuses are not affecting her, she reports no sinus issues. No other focal neurologic deficits, associated symptoms, inciting events or modifiable factors.  Medications tried: Topamax, gabapentin, amitriptyline, propranolol or any other blood pressure medication contraindicated due to hypotension  Reviewed notes, labs and imaging from outside physicians, which showed:  CMP unremarkable (slighty low Na 133 stable), tsh normal 07/27/2019  CT Maxillofacial reviewed images and agree with the following:  Clinical data: Sinusitis and cough.  LIMITED CT SCAN OF THE PARANASAL SINUSES WITHOUT CONTRAST:  Scans were performed in the axial plane through the paranasal sinuses. The paranasal sinuses are well visualized and there is no significant opacification. There is some very minimal mucosal thickening and a few tiny ethmoid air cells, but there are no air fluid levels or significant mucosal thickening. There is no significant nasal septal deviation. The mastoid air cells and middle ear cavities are clear.   IMPRESSION:  Essentially normal exam of the paranasal sinuses  Review of Systems: Patient complains of symptoms per HPI as well as the following symptoms: neck pain, headaches . Pertinent negatives and positives per HPI. All others negative    Social History   Socioeconomic History   Marital status: Married    Spouse name: Not on file   Number of children: 3   Years of education: Not on file   Highest education level: Bachelor's degree (e.g., BA, AB, BS)  Occupational  History   Not on file  Tobacco Use   Smoking status: Never Smoker   Smokeless tobacco: Never Used  Vaping Use   Vaping Use: Never used  Substance and Sexual Activity   Alcohol use: Never   Drug use: Never   Sexual activity: Yes    Birth control/protection: Surgical    Comment: tubal  ligation  Other Topics Concern   Not on file  Social History Narrative   Lives at home with husband & 3 children   Right handed   Caffeine: 2 cups per day, diet coke   Social Determinants of Health   Financial Resource Strain: Not on file  Food Insecurity: Not on file  Transportation Needs: Not on file  Physical Activity: Not on file  Stress: Not on file  Social Connections: Not on file  Intimate Partner Violence: Not on file    Family History  Problem Relation Age of Onset   Heart failure Maternal Grandmother    Hypertension Maternal Grandmother    Thyroid disease Maternal Grandmother    Arthritis Maternal Grandmother    Heart attack Maternal Grandmother    CAD Maternal Grandmother    Migraines Maternal Grandmother    Depression Mother    Hyperlipidemia Mother    Hypertension Mother    Migraines Mother    Hypertension Maternal Grandfather    Kidney disease Maternal Grandfather    Epilepsy Maternal Grandfather     Past Medical History:  Diagnosis Date   Anxiety    Asthma    Complication of anesthesia    COVID-19 01/2019   scrachy throat x few days all symptoms resolved   Depression    Ehlers-Danlos disease    Headache    History of gestational hypertension 2019   IBS (irritable bowel syndrome)    Migraines    PONV (postoperative nausea and vomiting)    Scoliosis    Vaginal Pap smear, abnormal     Patient Active Problem List   Diagnosis Date Noted   Dysautonomia (HCC) 07/15/2020   Migraine with aura and without status migrainosus, not intractable 12/31/2019   Vitamin D deficiency 07/30/2019   ADHD, predominantly inattentive type 04/07/2018   Anxiety 03/13/2018   Gestational hypertension 02/21/2018   EHLERS-DANLOS SYNDROME 09/01/2009   Mild asthma without complication 01/27/2008   GERD 01/27/2008   Chronic migraine without aura without status migrainosus, not intractable 01/27/2008   HEMORRHOIDS, EXTERNAL  02/18/2006    Past Surgical History:  Procedure Laterality Date   CHOLECYSTECTOMY  2002   COLPOSCOPY  2004   COMBINED HYSTEROSCOPY DIAGNOSTIC / D&C  2004   DILITATION & CURRETTAGE/HYSTROSCOPY WITH HYDROTHERMAL ABLATION N/A 05/29/2020   Procedure: DILATATION & CURETTAGE/HYSTEROSCOPY WITH NOVASURE ABLATION;  Surgeon: Mitchel Honour, DO;  Location: [facility];  Service: Gynecology;  Laterality: N/A;   HYSTEROSCOPY WITH D & C  2004   KNEE ARTHROSCOPY Right 1998 & 1999   LAPAROSCOPIC TUBAL LIGATION Bilateral 05/29/2020   Procedure: BILATERAL LAPAROSCOPIC TUBAL LIGATION WITH FILSHIE CLIPS;  Surgeon: Mitchel Honour, DO;  Location: [facility];  Service: Gynecology;  Laterality: Bilateral;   sinus sugrery  1998   TONSILLECTOMY  as child   w/ Adenoidectomy    Current Outpatient Medications  Medication Sig Dispense Refill   albuterol (VENTOLIN HFA) 108 (90 Base) MCG/ACT inhaler Inhale 2 puffs into the lungs every 6 (six) hours as needed for wheezing or shortness of breath. 6.7 g 0   azithromycin (ZITHROMAX) 250 MG tablet 2 tabs po  qd x 1 day; 1 tablet per day x 4 days; 6 tablet 0   baclofen (LIORESAL) 10 MG tablet Take 1 tablet (10 mg total) by mouth at bedtime. 90 tablet 1   benzonatate (TESSALON) 100 MG capsule Take 1 capsule (100 mg total) by mouth 3 (three) times daily as needed for cough. 30 capsule 0   cyclobenzaprine (FLEXERIL) 10 MG tablet Take 1 tablet (10 mg total) by mouth at bedtime. 90 tablet 3   Galcanezumab-gnlm (EMGALITY) 120 MG/ML SOAJ Inject 120 mg into the skin every 30 (thirty) days. 1.12 mL 6   hydrOXYzine (ATARAX/VISTARIL) 25 MG tablet TAKE 1 TABLET BY MOUTH AT BEDTIME AS NEEDED FOR ITCHING. 90 tablet 1   ibuprofen (ADVIL) 800 MG tablet Take 1 tablet (800 mg total) by mouth every 6 (six) hours as needed. 30 tablet 0   lisdexamfetamine (VYVANSE) 40 MG capsule Take 1 capsule (40 mg total) by mouth every morning. 30 capsule 0    methocarbamol (ROBAXIN) 500 MG tablet Take 1 tablet (500 mg total) by mouth every 8 (eight) hours as needed for muscle spasms. 90 tablet 6   nystatin-triamcinolone ointment (MYCOLOG) Apply 1 application topically 2 (two) times daily. 30 g 0   sertraline (ZOLOFT) 100 MG tablet Take 1 tablet (100 mg total) by mouth at bedtime. 90 tablet 1   SUMAtriptan (IMITREX) 100 MG tablet TAKE 1 TABLET BY MOUTH AT ONSET OF HEADACHE, MAY REPEAT IN 2 HOURS IF HEADACHE PERSISTS OR RECURS. NO MORE THAN 2 TABS IN 24 HOURS 9 tablet 3   topiramate (TOPAMAX) 50 MG tablet Take 1 tablet (50 mg total) by mouth at bedtime. 90 tablet 3   No current facility-administered medications for this visit.    Allergies as of 09/24/2020 - Review Complete 09/24/2020  Allergen Reaction Noted   Cephalexin Hives 09/01/2009    Vitals: BP 119/80 (BP Location: Right Arm, Patient Position: Sitting)    Pulse 72    Ht 5\' 7"  (1.702 m)    Wt 164 lb (74.4 kg)    BMI 25.69 kg/m  Last Weight:  Wt Readings from Last 1 Encounters:  09/24/20 164 lb (74.4 kg)   Last Height:   Ht Readings from Last 1 Encounters:  09/24/20 5\' 7"  (1.702 m)     Physical exam: Exam: Gen: NAD, conversant, well nourised, well groomed                     CV: RRR, no MRG. No Carotid Bruits. No peripheral edema, warm, nontender Eyes: Conjunctivae clear without exudates or hemorrhage  Neuro: Detailed Neurologic Exam  Speech:    Speech is normal; fluent and spontaneous with normal comprehension.  Cognition:    The patient is oriented to person, place, and time;     recent and remote memory intact;     language fluent;     normal attention, concentration,     fund of knowledge Cranial Nerves:    The pupils are equal, round, and reactive to light. The fundi are flat. Visual fields are full to finger confrontation. Extraocular movements are intact. Trigeminal sensation is intact and the muscles of mastication are normal. The face is symmetric. The palate  elevates in the midline. Hearing intact. Voice is normal. Shoulder shrug is normal. The tongue has normal motion without fasciculations.   Coordination:    No dysmetria  Gait:    Normal native gait  Motor Observation:     No asymmetry, no atrophy, and no  involuntary movements noted. Tone:    Normal muscle tone.    Posture:    Posture is normal. normal erect    Strength:right deltoid 4+/5, right triceps 4/5 otherwise strength is V/V in the upper and lower limbs.      Sensation: intact to LT     Reflex Exam:  DTR's:    Deep tendon reflexes in the upper and lower extremities are normal bilaterally.   Toes:    The toes are downgoing bilaterally.   Clonus:    Clonus is absent.    Assessment/Plan:  35 year old with chronic migraines without aura also rarely with aura. MRI brain normal. She has been having neck pain, decreased ROM, weakness right arm may be c5/c6 radiculopathy, she has been tryuing conservative measures for over 3 months, tried baclofen, OTC analgesics, heat, stretching, muscle relaxers, will order MRI cervical spine to evaluate for injections or surgical options.  MRI cervical spine Flexeril at bedtime Robaxin during the day: take duing the day, not with flexeril or baclofen. If taken together can cause sedation. Dry needling Return to botox year - she will call in a few months, she is changing insurance soon Ajovy  Orders Placed This Encounter  Procedures   MR CERVICAL SPINE WO CONTRAST     Meds ordered this encounter  Medications   cyclobenzaprine (FLEXERIL) 10 MG tablet    Sig: Take 1 tablet (10 mg total) by mouth at bedtime.    Dispense:  90 tablet    Refill:  3   methocarbamol (ROBAXIN) 500 MG tablet    Sig: Take 1 tablet (500 mg total) by mouth every 8 (eight) hours as needed for muscle spasms.    Dispense:  90 tablet    Refill:  6   Galcanezumab-gnlm (EMGALITY) 120 MG/ML SOAJ    Sig: Inject 120 mg into the skin every 30 (thirty) days.     Dispense:  1.12 mL    Refill:  6    Prior: Dry needling: [facility] or PT referral for dry needling [place]: Evaluate and treat for cervical myofascial pain syndrome and migraines including Dry Needling Start Botox approval Discussed Ajovy if needed in the future.  Discuss acute management at next appointment    Cc: Orland Mustard, MD  Naomie Dean, MD  [facility] [address] [place], [place] [address]  Phone [phone] Fax [phone]  I spent over 30 minutes of face-to-face and non-face-to-face time with patient on the  1. Cervical radiculopathy at C5   2. Cervicalgia   3. Right arm weakness   4. Right arm numbness   5. Right arm pain   6. Chronic neck pain    diagnosis.  This included previsit chart review, lab review, study review, order entry, electronic health record documentation, patient education on the different diagnostic and therapeutic options, counseling and coordination of care, risks and benefits of management, compliance, or risk factor reduction

## 2020-09-24 ENCOUNTER — Other Ambulatory Visit: Payer: Self-pay

## 2020-09-24 ENCOUNTER — Encounter: Payer: Self-pay | Admitting: Neurology

## 2020-09-24 ENCOUNTER — Other Ambulatory Visit: Payer: Self-pay | Admitting: Neurology

## 2020-09-24 ENCOUNTER — Ambulatory Visit: Payer: No Typology Code available for payment source | Admitting: Neurology

## 2020-09-24 VITALS — BP 119/80 | HR 72 | Ht 67.0 in | Wt 164.0 lb

## 2020-09-24 DIAGNOSIS — M542 Cervicalgia: Secondary | ICD-10-CM

## 2020-09-24 DIAGNOSIS — R200 Anesthesia of skin: Secondary | ICD-10-CM | POA: Diagnosis not present

## 2020-09-24 DIAGNOSIS — M79601 Pain in right arm: Secondary | ICD-10-CM

## 2020-09-24 DIAGNOSIS — M5412 Radiculopathy, cervical region: Secondary | ICD-10-CM

## 2020-09-24 DIAGNOSIS — G8929 Other chronic pain: Secondary | ICD-10-CM

## 2020-09-24 DIAGNOSIS — R29898 Other symptoms and signs involving the musculoskeletal system: Secondary | ICD-10-CM

## 2020-09-24 MED ORDER — EMGALITY 120 MG/ML ~~LOC~~ SOAJ: 120.0000 mg | SUBCUTANEOUS | 6 refills | Status: DC

## 2020-09-24 MED ORDER — METHOCARBAMOL 500 MG PO TABS: 500.0000 mg | ORAL_TABLET | Freq: Three times a day (TID) | ORAL | 6 refills | Status: DC | PRN

## 2020-09-24 MED ORDER — CYCLOBENZAPRINE HCL 10 MG PO TABS: 10.0000 mg | ORAL_TABLET | Freq: Every day | ORAL | 3 refills | Status: DC

## 2020-09-24 MED FILL — METHOCARBAMOL 500 MG TABS: 500 | 30 days supply | Qty: 90 | Fill #0

## 2020-09-24 MED FILL — CYCLOBENZAPRINE HCL 10 MG T: 10 | 90 days supply | Qty: 90 | Fill #0

## 2020-09-24 NOTE — Patient Instructions (Signed)
Robaxin during the day: take during the day, not with flexeril or baclofen. If taken together can cause sedation. Flexeril at bedtime MRI cervical spine Dry needling Emgality ,monthly  Galcanezumab injection What is this medicine? GALCANEZUMAB (gal ka NEZ ue mab) is used to prevent migraines and treat cluster headaches. This medicine may be used for other purposes; ask your health care provider or pharmacist if you have questions. COMMON BRAND NAME(S): Emgality What should I tell my health care provider before I take this medicine? They need to know if you have any of these conditions:  an unusual or allergic reaction to galcanezumab, other medicines, foods, dyes, or preservatives  pregnant or trying to get pregnant  breast-feeding How should I use this medicine? This medicine is for injection under the skin. You will be taught how to prepare and give this medicine. Use exactly as directed. Take your medicine at regular intervals. Do not take your medicine more often than directed. It is important that you put your used needles and syringes in a special sharps container. Do not put them in a trash can. If you do not have a sharps container, call your pharmacist or healthcare provider to get one. Talk to your pediatrician regarding the use of this medicine in children. Special care may be needed. Overdosage: If you think you have taken too much of this medicine contact a poison control center or emergency room at once. NOTE: This medicine is only for you. Do not share this medicine with others. What if I miss a dose? If you miss a dose, take it as soon as you can. If it is almost time for your next dose, take only that dose. Do not take double or extra doses. What may interact with this medicine? Interactions are not expected. This list may not describe all possible interactions. Give your health care provider a list of all the medicines, herbs, non-prescription drugs, or dietary  supplements you use. Also tell them if you smoke, drink alcohol, or use illegal drugs. Some items may interact with your medicine. What should I watch for while using this medicine? Tell your doctor or healthcare professional if your symptoms do not start to get better or if they get worse. What side effects may I notice from receiving this medicine? Side effects that you should report to your doctor or health care professional as soon as possible:  allergic reactions like skin rash, itching or hives, swelling of the face, lips, or tongue Side effects that usually do not require medical attention (report these to your doctor or health care professional if they continue or are bothersome):  pain, redness, or irritation at site where injected This list may not describe all possible side effects. Call your doctor for medical advice about side effects. You may report side effects to FDA at 1-800-FDA-1088. Where should I keep my medicine? Keep out of the reach of children. You will be instructed on how to store this medicine. Throw away any unused medicine after the expiration date on the label. NOTE: This sheet is a summary. It may not cover all possible information. If you have questions about this medicine, talk to your doctor, pharmacist, or health care provider.  2021 Elsevier/Gold Standard (2018-01-25 12:03:23) Cyclobenzaprine tablets What is this medicine? CYCLOBENZAPRINE (sye kloe BEN za preen) is a muscle relaxer. It is used to treat muscle pain, spasms, and stiffness. This medicine may be used for other purposes; ask your health care provider or pharmacist if you have  questions. COMMON BRAND NAME(S): Fexmid, Flexeril What should I tell my health care provider before I take this medicine? They need to know if you have any of these conditions:  heart disease, irregular heartbeat, or previous heart attack  liver disease  thyroid problem  an unusual or allergic reaction to  cyclobenzaprine, tricyclic antidepressants, lactose, other medicines, foods, dyes, or preservatives  pregnant or trying to get pregnant  breast-feeding How should I use this medicine? Take this medicine by mouth with a glass of water. Follow the directions on the prescription label. If this medicine upsets your stomach, take it with food or milk. Take your medicine at regular intervals. Do not take it more often than directed. Talk to your pediatrician regarding the use of this medicine in children. Special care may be needed. Overdosage: If you think you have taken too much of this medicine contact a poison control center or emergency room at once. NOTE: This medicine is only for you. Do not share this medicine with others. What if I miss a dose? If you miss a dose, take it as soon as you can. If it is almost time for your next dose, take only that dose. Do not take double or extra doses. What may interact with this medicine? Do not take this medicine with any of the following medications:  MAOIs like Carbex, Eldepryl, Marplan, Nardil, and Parnate  narcotic medicines for cough  safinamide This medicine may also interact with the following medications:  alcohol  bupropion  antihistamines for allergy, cough and cold  certain medicines for anxiety or sleep  certain medicines for bladder problems like oxybutynin, tolterodine  certain medicines for depression like amitriptyline, fluoxetine, sertraline  certain medicines for Parkinson's disease like benztropine, trihexyphenidyl  certain medicines for seizures like phenobarbital, primidone  certain medicines for stomach problems like dicyclomine, hyoscyamine  certain medicines for travel sickness like scopolamine  general anesthetics like halothane, isoflurane, methoxyflurane, propofol  ipratropium  local anesthetics like lidocaine, pramoxine, tetracaine  medicines that relax muscles for surgery  narcotic medicines for  pain  phenothiazines like chlorpromazine, mesoridazine, prochlorperazine, thioridazine  verapamil This list may not describe all possible interactions. Give your health care provider a list of all the medicines, herbs, non-prescription drugs, or dietary supplements you use. Also tell them if you smoke, drink alcohol, or use illegal drugs. Some items may interact with your medicine. What should I watch for while using this medicine? Tell your doctor or health care professional if your symptoms do not start to get better or if they get worse. You may get drowsy or dizzy. Do not drive, use machinery, or do anything that needs mental alertness until you know how this medicine affects you. Do not stand or sit up quickly, especially if you are an older patient. This reduces the risk of dizzy or fainting spells. Alcohol may interfere with the effect of this medicine. Avoid alcoholic drinks. If you are taking another medicine that also causes drowsiness, you may have more side effects. Give your health care provider a list of all medicines you use. Your doctor will tell you how much medicine to take. Do not take more medicine than directed. Call emergency for help if you have problems breathing or unusual sleepiness. Your mouth may get dry. Chewing sugarless gum or sucking hard candy, and drinking plenty of water may help. Contact your doctor if the problem does not go away or is severe. What side effects may I notice from receiving this medicine? Side  effects that you should report to your doctor or health care professional as soon as possible:  allergic reactions like skin rash, itching or hives, swelling of the face, lips, or tongue  breathing problems  chest pain  fast, irregular heartbeat  hallucinations  seizures  unusually weak or tired Side effects that usually do not require medical attention (report to your doctor or health care professional if they continue or are  bothersome):  headache  nausea, vomiting This list may not describe all possible side effects. Call your doctor for medical advice about side effects. You may report side effects to FDA at 1-800-FDA-1088. Where should I keep my medicine? Keep out of the reach of children. Store at room temperature between 15 and 30 degrees C (59 and 86 degrees F). Keep container tightly closed. Throw away any unused medicine after the expiration date. NOTE: This sheet is a summary. It may not cover all possible information. If you have questions about this medicine, talk to your doctor, pharmacist, or health care provider.  2021 Elsevier/Gold Standard (2018-07-12 12:49:26) Methocarbamol tablets What is this medicine? METHOCARBAMOL (meth oh KAR ba mole) helps to relieve pain and stiffness in muscles caused by strains, sprains, or other injury to your muscles. This medicine may be used for other purposes; ask your health care provider or pharmacist if you have questions. COMMON BRAND NAME(S): Robaxin What should I tell my health care provider before I take this medicine? They need to know if you have any of these conditions:  kidney disease  seizures  an unusual or allergic reaction to methocarbamol, other medicines, foods, dyes, or preservatives  pregnant or trying to get pregnant  breast-feeding How should I use this medicine? Take this medicine by mouth with a full glass of water. Follow the directions on the prescription label. Take your medicine at regular intervals. Do not take your medicine more often than directed. Talk to your pediatrician regarding the use of this medicine in children. Special care may be needed. Overdosage: If you think you have taken too much of this medicine contact a poison control center or emergency room at once. NOTE: This medicine is only for you. Do not share this medicine with others. What if I miss a dose? If you miss a dose, take it as soon as you can. If it is  almost time for your next dose, take only the next dose. Do not take double or extra doses. What may interact with this medicine? Do not take this medication with any of the following medicines:  narcotic medicines for cough This medicine may also interact with the following medications:  alcohol  antihistamines for allergy, cough and cold  certain medicines for anxiety or sleep  certain medicines for depression like amitriptyline, fluoxetine, sertraline  certain medicines for seizures like phenobarbital, primidone  cholinesterase inhibitors like neostigmine, ambenonium, and pyridostigmine bromide  general anesthetics like halothane, isoflurane, methoxyflurane, propofol  local anesthetics like lidocaine, pramoxine, tetracaine  medicines that relax muscles for surgery  narcotic medicines for pain  phenothiazines like chlorpromazine, mesoridazine, prochlorperazine, thioridazine This list may not describe all possible interactions. Give your health care provider a list of all the medicines, herbs, non-prescription drugs, or dietary supplements you use. Also tell them if you smoke, drink alcohol, or use illegal drugs. Some items may interact with your medicine. What should I watch for while using this medicine? Tell your doctor or health care professional if your symptoms do not start to get better or if  they get worse. You may get drowsy or dizzy. Do not drive, use machinery, or do anything that needs mental alertness until you know how this medicine affects you. Do not stand or sit up quickly, especially if you are an older patient. This reduces the risk of dizzy or fainting spells. Alcohol may interfere with the effect of this medicine. Avoid alcoholic drinks. If you are taking another medicine that also causes drowsiness, you may have more side effects. Give your health care provider a list of all medicines you use. Your doctor will tell you how much medicine to take. Do not take more  medicine than directed. Call emergency for help if you have problems breathing or unusual sleepiness. What side effects may I notice from receiving this medicine? Side effects that you should report to your doctor or health care professional as soon as possible:  allergic reactions like skin rash, itching or hives, swelling of the face, lips, or tongue  breathing problems  confusion  seizures  unusually weak or tired Side effects that usually do not require medical attention (report to your doctor or health care professional if they continue or are bothersome):  dizziness  headache  metallic taste  tiredness  upset stomach This list may not describe all possible side effects. Call your doctor for medical advice about side effects. You may report side effects to FDA at 1-800-FDA-1088. Where should I keep my medicine? Keep out of the reach of children. Store at room temperature between 20 and 25 degrees C (68 and 77 degrees F). Keep container tightly closed. Throw away any unused medicine after the expiration date. NOTE: This sheet is a summary. It may not cover all possible information. If you have questions about this medicine, talk to your doctor, pharmacist, or health care provider.  2021 Elsevier/Gold Standard (2015-05-20 13:11:54)

## 2020-09-25 ENCOUNTER — Telehealth: Payer: Self-pay | Admitting: Neurology

## 2020-09-25 NOTE — Telephone Encounter (Signed)
Patient returned my call she is scheduled at [facility] for 10/01/20.

## 2020-09-25 NOTE — Telephone Encounter (Signed)
LVM for pt to call back about scheduling her MRI & sent her a mychart message  [employer] auth: [id number] (exp. 09/30/20 to 10/30/20)

## 2020-10-01 ENCOUNTER — Other Ambulatory Visit: Payer: Self-pay

## 2020-10-01 ENCOUNTER — Ambulatory Visit: Payer: No Typology Code available for payment source

## 2020-10-01 DIAGNOSIS — M542 Cervicalgia: Secondary | ICD-10-CM | POA: Diagnosis not present

## 2020-10-01 DIAGNOSIS — G8929 Other chronic pain: Secondary | ICD-10-CM

## 2020-10-01 DIAGNOSIS — M5412 Radiculopathy, cervical region: Secondary | ICD-10-CM | POA: Diagnosis not present

## 2020-10-01 DIAGNOSIS — R200 Anesthesia of skin: Secondary | ICD-10-CM

## 2020-10-01 DIAGNOSIS — R29898 Other symptoms and signs involving the musculoskeletal system: Secondary | ICD-10-CM

## 2020-10-01 DIAGNOSIS — M79601 Pain in right arm: Secondary | ICD-10-CM

## 2020-10-10 ENCOUNTER — Other Ambulatory Visit: Payer: Self-pay | Admitting: Family Medicine

## 2020-10-10 MED ORDER — LISDEXAMFETAMINE DIMESYLATE 40 MG PO CAPS
40.0000 mg | ORAL_CAPSULE | ORAL | 0 refills | Status: DC
Start: 2020-10-10 — End: 2020-10-10

## 2020-10-10 MED FILL — VYVANSE 40 MG CAPSULE: 40 | 30 days supply | Qty: 30 | Fill #0

## 2020-10-10 NOTE — Telephone Encounter (Signed)
Pt requesting Vyvanse 40 mg tab LOV: 08/04/2020 Next Visit: 11/24/2020 Last rx refill: 09/09/2020  Approve?

## 2020-10-13 ENCOUNTER — Other Ambulatory Visit: Payer: Self-pay | Admitting: Neurology

## 2020-10-13 MED ORDER — ONDANSETRON 4 MG PO TBDP: 4.0000 mg | ORAL_TABLET | Freq: Three times a day (TID) | ORAL | 3 refills | Status: DC | PRN

## 2020-10-13 MED FILL — SUMATRIPTAN SUCCINATE 100 M: 100 | 30 days supply | Qty: 9 | Fill #2

## 2020-10-13 MED FILL — ONDANSETRON ODT 4 MG TABLET: 4 | 10 days supply | Qty: 30 | Fill #0

## 2020-10-14 MED FILL — SERTRALINE HCL 100 MG TABS: 100 | 90 days supply | Qty: 90 | Fill #1

## 2020-10-24 ENCOUNTER — Telehealth: Payer: No Typology Code available for payment source | Admitting: Physician Assistant

## 2020-10-24 DIAGNOSIS — J019 Acute sinusitis, unspecified: Secondary | ICD-10-CM | POA: Diagnosis not present

## 2020-10-24 MED ORDER — SULFAMETHOXAZOLE-TRIMETHOPRIM 800-160 MG PO TABS: 1.0000 | ORAL_TABLET | Freq: Two times a day (BID) | ORAL | 0 refills | Status: DC

## 2020-10-24 NOTE — Progress Notes (Signed)
We are sorry that you are not feeling well.  Here is how we plan to help!  Based on what you have shared with me it looks like you have may have a sinusitis.  Sinusitis is inflammation and infection in the sinus cavities of the head.  Based on your presentation I believe you most likely have Acute Bacterial Sinusitis.  This is an infection caused by bacteria and is treated with antibiotics. I have prescribed Bactrim twice daily for 7 days. You may use an oral decongestant such as Mucinex D or if you have glaucoma or high blood pressure use plain Mucinex. Saline nasal spray help and can safely be used as often as needed for congestion.  If you develop worsening sinus pain, fever or notice severe headache and vision changes, or if symptoms are not better after completion of antibiotic, please schedule an appointment with a health care provider.    You still need to remain quarantined until your COVID results are in. If this comes back positive you need to let us or your primary care provider know so we can make adjustments. I am going ahead and treating for sinusitis giving your history of the same.   Sinus infections are not as easily transmitted as other respiratory infection, however we still recommend that you avoid close contact with loved ones, especially the very young and elderly.  Remember to wash your hands thoroughly throughout the day as this is the number one way to prevent the spread of infection!  Home Care:  Only take medications as instructed by your medical team.  Complete the entire course of an antibiotic.  Do not take these medications with alcohol.  A steam or ultrasonic humidifier can help congestion.  You can place a towel over your head and breathe in the steam from hot water coming from a faucet.  Avoid close contacts especially the very young and the elderly.  Cover your mouth when you cough or sneeze.  Always remember to wash your hands.  Get Help Right Away  If:  You develop worsening fever or sinus pain.  You develop a severe head ache or visual changes.  Your symptoms persist after you have completed your treatment plan.  Make sure you  Understand these instructions.  Will watch your condition.  Will get help right away if you are not doing well or get worse.  Your e-visit answers were reviewed by a board certified advanced clinical practitioner to complete your personal care plan.  Depending on the condition, your plan could have included both over the counter or prescription medications.  If there is a problem please reply  once you have received a response from your provider.  Your safety is important to [place].  If you have drug allergies check your prescription carefully.    You can use MyChart to ask questions about today's visit, request a non-urgent call back, or ask for a work or school excuse for 24 hours related to this e-Visit. If it has been greater than 24 hours you will need to follow up with your provider, or enter a new e-Visit to address those concerns.  You will get an e-mail in the next two days asking about your experience.  I hope that your e-visit has been valuable and will speed your recovery. Thank you for using e-visits.

## 2020-10-24 NOTE — Progress Notes (Signed)
I have spent 5 minutes in review of e-visit questionnaire, review and updating patient chart, medical decision making and response to patient.   Nathanie Ottley Cody Janis Sol, PA-C    

## 2020-10-24 NOTE — Progress Notes (Signed)
Message sent to patient requesting further input regarding current symptoms. Awaiting patient response.  

## 2020-10-29 ENCOUNTER — Telehealth: Payer: No Typology Code available for payment source | Admitting: Nurse Practitioner

## 2020-10-29 DIAGNOSIS — J00 Acute nasopharyngitis [common cold]: Secondary | ICD-10-CM | POA: Diagnosis not present

## 2020-10-29 NOTE — Progress Notes (Signed)
Based on what you shared with me, I feel your condition warrants further evaluation and I recommend that you be seen for a face to face visit.  Please contact your primary care physician practice to be seen. Many offices offer virtual options to be seen via video if you are not comfortable going in person to a medical facility at this time.  If you do not have a PCP, [place] offers a free physician referral service available at [phone]. Our trained staff has the experience, knowledge and resources to put you in touch with a physician who is right for you.   SInce bactrim did not work you need to see your PCP for further evaluation nad proper treatment.  You also have the option of a video visit through [email or web address]  If you are having a true medical emergency please call 911.  NOTE: If you entered your credit card information for this eVisit, you will not be charged. You may see a "hold" on your card for the $35 but that hold will drop off and you will not have a charge processed.  Your e-visit answers were reviewed by a board certified advanced clinical practitioner to complete your personal care plan.  Thank you for using e-Visits.

## 2020-11-24 ENCOUNTER — Other Ambulatory Visit: Payer: Self-pay

## 2020-11-24 ENCOUNTER — Encounter: Payer: Self-pay | Admitting: Family Medicine

## 2020-11-24 ENCOUNTER — Other Ambulatory Visit: Payer: Self-pay | Admitting: Family Medicine

## 2020-11-24 ENCOUNTER — Other Ambulatory Visit (HOSPITAL_COMMUNITY): Payer: Self-pay

## 2020-11-24 ENCOUNTER — Ambulatory Visit (INDEPENDENT_AMBULATORY_CARE_PROVIDER_SITE_OTHER): Payer: Managed Care, Other (non HMO) | Admitting: Family Medicine

## 2020-11-24 VITALS — BP 110/60 | HR 74 | Temp 97.2°F | Ht 67.0 in | Wt 164.8 lb

## 2020-11-24 DIAGNOSIS — Z0000 Encounter for general adult medical examination without abnormal findings: Secondary | ICD-10-CM

## 2020-11-24 DIAGNOSIS — E559 Vitamin D deficiency, unspecified: Secondary | ICD-10-CM | POA: Diagnosis not present

## 2020-11-24 DIAGNOSIS — Z1159 Encounter for screening for other viral diseases: Secondary | ICD-10-CM | POA: Diagnosis not present

## 2020-11-24 DIAGNOSIS — Z5181 Encounter for therapeutic drug level monitoring: Secondary | ICD-10-CM | POA: Diagnosis not present

## 2020-11-24 DIAGNOSIS — F900 Attention-deficit hyperactivity disorder, predominantly inattentive type: Secondary | ICD-10-CM | POA: Diagnosis not present

## 2020-11-24 DIAGNOSIS — F419 Anxiety disorder, unspecified: Secondary | ICD-10-CM

## 2020-11-24 LAB — VITAMIN D 25 HYDROXY (VIT D DEFICIENCY, FRACTURES): VITD: 16.1 ng/mL — ABNORMAL LOW (ref 30.00–100.00)

## 2020-11-24 LAB — COMPREHENSIVE METABOLIC PANEL
ALT: 9 U/L (ref 0–35)
AST: 11 U/L (ref 0–37)
Albumin: 4.5 g/dL (ref 3.5–5.2)
Alkaline Phosphatase: 85 U/L (ref 39–117)
BUN: 17 mg/dL (ref 6–23)
CO2: 29 mEq/L (ref 19–32)
Calcium: 9.6 mg/dL (ref 8.4–10.5)
Chloride: 102 mEq/L (ref 96–112)
Creatinine, Ser: 0.69 mg/dL (ref 0.40–1.20)
GFR: 111.57 mL/min (ref >60.00)
Glucose, Bld: 88 mg/dL (ref 70–99)
Potassium: 4.5 mEq/L (ref 3.5–5.1)
Sodium: 138 mEq/L (ref 135–145)
Total Bilirubin: 0.5 mg/dL (ref 0.2–1.2)
Total Protein: 7.1 g/dL (ref 6.0–8.3)

## 2020-11-24 LAB — CBC WITH DIFFERENTIAL/PLATELET
Basophils Absolute: 0.2 10*3/uL — ABNORMAL HIGH (ref 0.0–0.1)
Basophils Relative: 2.3 % (ref 0.0–3.0)
Eosinophils Absolute: 0.2 10*3/uL (ref 0.0–0.7)
Eosinophils Relative: 3.2 % (ref 0.0–5.0)
HCT: 44.8 % (ref 36.0–46.0)
Hemoglobin: 14.9 g/dL (ref 12.0–15.0)
Lymphocytes Relative: 29.9 % (ref 12.0–46.0)
Lymphs Abs: 2.1 10*3/uL (ref 0.7–4.0)
MCHC: 33.3 g/dL (ref 30.0–36.0)
MCV: 90.9 fl (ref 78.0–100.0)
Monocytes Absolute: 0.5 10*3/uL (ref 0.1–1.0)
Monocytes Relative: 7.3 % (ref 3.0–12.0)
Neutro Abs: 4 10*3/uL (ref 1.4–7.7)
Neutrophils Relative %: 57.3 % (ref 43.0–77.0)
Platelets: 250 10*3/uL (ref 150.0–400.0)
RBC: 4.93 Mil/uL (ref 3.87–5.11)
RDW: 13.4 % (ref 11.5–15.5)
WBC: 7 10*3/uL (ref 4.0–10.5)

## 2020-11-24 LAB — LIPID PANEL
Cholesterol: 221 mg/dL — ABNORMAL HIGH (ref 0–200)
HDL: 76.3 mg/dL (ref >39.00)
LDL Cholesterol: 127 mg/dL — ABNORMAL HIGH (ref 0–99)
NonHDL: 144.84
Total CHOL/HDL Ratio: 3
Triglycerides: 88 mg/dL (ref 0.0–149.0)
VLDL: 17.6 mg/dL (ref 0.0–40.0)

## 2020-11-24 LAB — TSH: TSH: 0.69 u[IU]/mL (ref 0.35–4.50)

## 2020-11-24 MED ORDER — LISDEXAMFETAMINE DIMESYLATE 40 MG PO CAPS: ORAL_CAPSULE | Freq: Every morning | ORAL | 0 refills | Status: DC

## 2020-11-24 MED ORDER — SERTRALINE HCL 100 MG PO TABS: ORAL_TABLET | Freq: Every day | ORAL | 1 refills | Status: DC

## 2020-11-24 MED ORDER — HYDROXYZINE HCL 25 MG PO TABS: ORAL_TABLET | ORAL | 1 refills | Status: AC

## 2020-11-24 MED ORDER — VITAMIN D (ERGOCALCIFEROL) 1.25 MG (50000 UNIT) PO CAPS
ORAL_CAPSULE | ORAL | 0 refills | Status: DC
  Filled 2020-11-24: qty 12, 84d supply, fill #0

## 2020-11-24 NOTE — Progress Notes (Signed)
Patient: Alison Nichols MRN: [id number] DOB: 1986-07-08 PCP: Orland Mustard, MD     Subjective:  Chief Complaint  Patient presents with  . Annual Exam  . ADHD  . vitamin d deficiency  . Anxiety    HPI: The patient is a 35 y.o. female who presents today for annual exam. She denies any changes to past medical history. There have been no recent hospitalizations. They are following a well balanced diet and exercise plan. She is walking daily and trying to eat clean.  She walks a couple of times weekly. Weight has been increasing steadily. Also here for routine adhd follow up and anxiety f/u.   No family hx of breast or colon cancer.   ADHD Currently on vyvanse 40mg /daily. She is happy with this dosage. No issues. Working 5 days a week as a . She states her symptoms are well controlled. Sleeping well and states she has less side effects on the vyvanse compared to the adderall xr. No increased migraines and her tachycardia is better. Drug holidays taken when not working.   Anxiety On zoloft 100mg /day. Well controlled. No desire to change medication. Is needing a refill today.   Vitamin d deficiency Recheck today   Immunization History  Administered Date(s) Administered  . Influenza-Unspecified 05/26/2020   Colonoscopy: routine screening  Mammogram: routine screening  Pap smear: 2018. Due to see gyn.    Review of Systems  Constitutional: Negative for chills, fatigue and fever.  HENT: Negative for dental problem, ear pain, hearing loss and trouble swallowing.   Eyes: Negative for visual disturbance.  Respiratory: Negative for cough, chest tightness and shortness of breath.   Cardiovascular: Negative for chest pain, palpitations and leg swelling.  Gastrointestinal: Negative for abdominal pain, blood in stool, diarrhea and nausea.  Endocrine: Negative for cold intolerance, polydipsia, polyphagia and polyuria.  Genitourinary: Negative for dysuria and hematuria.   Musculoskeletal: Negative for arthralgias.  Skin: Negative for rash.  Neurological: Negative for dizziness and headaches.  Psychiatric/Behavioral: Negative for dysphoric mood and sleep disturbance. The patient is not nervous/anxious.     Allergies Patient is allergic to cephalexin.  Past Medical History Patient  has a past medical history of Anxiety, Asthma, Complication of anesthesia, COVID-19 (01/2019), Depression, Ehlers-Danlos disease, Headache, History of gestational hypertension (2019), IBS (irritable bowel syndrome), Migraines, PONV (postoperative nausea and vomiting), Scoliosis, and Vaginal Pap smear, abnormal.  Surgical History Patient  has a past surgical history that includes Hysteroscopy with D & C (2004); Knee arthroscopy (Right, 1998 & 1999); Colposcopy (2004); Combined hysteroscopy diagnostic / D&C (2004); Tonsillectomy (as child); Cholecystectomy (2002); sinus sugrery (1998); Dilatation & currettage/hysteroscopy with hydrothermal ablation (N/A, 05/29/2020); and Laparoscopic tubal ligation (Bilateral, 05/29/2020).  Family History Pateint's family history includes Arthritis in her maternal grandmother; CAD in her maternal grandmother; Depression in her mother; Epilepsy in her maternal grandfather; Heart attack in her maternal grandmother; Heart failure in her maternal grandmother; Hyperlipidemia in her mother; Hypertension in her maternal grandfather, maternal grandmother, and mother; Kidney disease in her maternal grandfather; Migraines in her maternal grandmother and mother; Thyroid disease in her maternal grandmother.  Social History Patient  reports that she has never smoked. She has never used smokeless tobacco. She reports that she does not drink alcohol and does not use drugs.    Objective: Vitals:   11/24/20 0856  BP: 110/60  Pulse: 74  Temp: (!) 97.2 F (36.2 C)  TempSrc: Temporal  SpO2: 99%  Weight: 164 lb 12.8 oz (74.8 kg)  Height:  5\' 7"  (1.702 m)    Body  mass index is 25.81 kg/m.  Physical Exam Vitals reviewed.  Constitutional:      Appearance: Normal appearance. She is well-developed. She is obese.  HENT:     Head: Normocephalic and atraumatic.     Right Ear: Tympanic membrane, ear canal and external ear normal.     Left Ear: Tympanic membrane and external ear normal.     Nose: Nose normal.     Mouth/Throat:     Mouth: Mucous membranes are moist.  Eyes:     Conjunctiva/sclera: Conjunctivae normal.     Pupils: Pupils are equal, round, and reactive to light.  Neck:     Thyroid: No thyromegaly.     Vascular: No carotid bruit.  Cardiovascular:     Rate and Rhythm: Normal rate and regular rhythm.     Pulses: Normal pulses.     Heart sounds: Normal heart sounds. No murmur heard.   Pulmonary:     Effort: Pulmonary effort is normal.     Breath sounds: Normal breath sounds.  Abdominal:     General: Abdomen is flat. Bowel sounds are normal. There is no distension.     Palpations: Abdomen is soft.     Tenderness: There is no abdominal tenderness.  Musculoskeletal:     Cervical back: Normal range of motion and neck supple.  Lymphadenopathy:     Cervical: No cervical adenopathy.  Skin:    General: Skin is warm and dry.     Capillary Refill: Capillary refill takes less than 2 seconds.     Findings: No rash.  Neurological:     General: No focal deficit present.     Mental Status: She is alert and oriented to person, place, and time.     Cranial Nerves: No cranial nerve deficit.     Coordination: Coordination normal.     Deep Tendon Reflexes: Reflexes normal.  Psychiatric:        Mood and Affect: Mood normal.        Behavior: Behavior normal.    Flowsheet Row Office Visit from 11/24/2020 in [facility]  PHQ-2 Total Score 0          Assessment/plan: 1. Annual physical exam Routine fasting labs today. HM reveiwed. Needs pap smear and will schedule with her gyn. Frustrated with weight gain. Advised  changing up her workouts from walking to something more cardio intensive. She does do restricted caloric intake. F/u in 1 year or as needed.  Patient counseling [x]    Nutrition: Stressed importance of moderation in sodium/caffeine intake, saturated fat and cholesterol, caloric balance, sufficient intake of fresh fruits, vegetables, fiber, calcium, iron, and 1 mg of folate supplement per day (for females capable of pregnancy).  [x]    Stressed the importance of regular exercise.   []    Substance Abuse: Discussed cessation/primary prevention of tobacco, alcohol, or other drug use; driving or other dangerous activities under the influence; availability of treatment for abuse.   [x]    Injury prevention: Discussed safety belts, safety helmets, smoke detector, smoking near bedding or upholstery.   [x]    Sexuality: Discussed sexually transmitted diseases, partner selection, use of condoms, avoidance of unintended pregnancy  and contraceptive alternatives.  [x]    Dental health: Discussed importance of regular tooth brushing, flossing, and dental visits.  [x]    Health maintenance and immunizations reviewed. Please refer to Health maintenance section.    - CBC with Differential/Platelet - Comprehensive metabolic panel - TSH -  Lipid panel  2. Encounter for hepatitis C screening test for low risk patient  - Hepatitis C antibody  3. Therapeutic drug monitoring UDS for ADHD medication.  - Pain Management Screening Profile (10S)  4. ADHD, predominantly inattentive type pmp website reviewed and verified. Filling correctly with hx of drug holidays. Doing much better on vyvanse from a POTS standpoint as well. F/u in 3 months for routine visit.   5. Vitamin D deficiency  - VITAMIN D 25 Hydroxy (Vit-D Deficiency, Fractures)  6. anxiety -well controlled. Continue zoloft 100mg /day. Refills given. F/u in 6 months.   This visit occurred during the SARS-CoV-2 public health emergency.  Safety protocols were  in place, including screening questions prior to the visit, additional usage of staff PPE, and extensive cleaning of exam room while observing appropriate contact time as indicated for disinfecting solutions.    Return in about 3 months (around 02/23/2021) for routien adhd. 04/26/2021, MD [place] [facility]  11/24/2020

## 2020-11-24 NOTE — Patient Instructions (Signed)
-doing great, would try to change up your workout routine. Sometimes it helps to stress the body more and burn more calories.   -routine labs, yearly UDS    Preventive Care 56-37 Years Old, Female Preventive care refers to lifestyle choices and visits with your health care provider that can promote health and wellness. This includes:  A yearly physical exam. This is also called an annual wellness visit.  Regular dental and eye exams.  Immunizations.  Screening for certain conditions.  Healthy lifestyle choices, such as: ? Eating a healthy diet. ? Getting regular exercise. ? Not using drugs or products that contain nicotine and tobacco. ? Limiting alcohol use. What can I expect for my preventive care visit? Physical exam Your health care provider may check your:  Height and weight. These may be used to calculate your BMI (body mass index). BMI is a measurement that tells if you are at a healthy weight.  Heart rate and blood pressure.  Body temperature.  Skin for abnormal spots. Counseling Your health care provider may ask you questions about your:  Past medical problems.  Family's medical history.  Alcohol, tobacco, and drug use.  Emotional well-being.  Home life and relationship well-being.  Sexual activity.  Diet, exercise, and sleep habits.  Work and work Statistician.  Access to firearms.  Method of birth control.  Menstrual cycle.  Pregnancy history. What immunizations do I need? Vaccines are usually given at various ages, according to a schedule. Your health care provider will recommend vaccines for you based on your age, medical history, and lifestyle or other factors, such as travel or where you work.   What tests do I need? Blood tests  Lipid and cholesterol levels. These may be checked every 5 years starting at age 26.  Hepatitis C test.  Hepatitis B test. Screening  Diabetes screening. This is done by checking your blood sugar (glucose)  after you have not eaten for a while (fasting).  STD (sexually transmitted disease) testing, if you are at risk.  BRCA-related cancer screening. This may be done if you have a family history of breast, ovarian, tubal, or peritoneal cancers.  Pelvic exam and Pap test. This may be done every 3 years starting at age 54. Starting at age 69, this may be done every 5 years if you have a Pap test in combination with an HPV test. Talk with your health care provider about your test results, treatment options, and if necessary, the need for more tests.   Follow these instructions at home: Eating and drinking  Eat a healthy diet that includes fresh fruits and vegetables, whole grains, lean protein, and low-fat dairy products.  Take vitamin and mineral supplements as recommended by your health care provider.  Do not drink alcohol if: ? Your health care provider tells you not to drink. ? You are pregnant, may be pregnant, or are planning to become pregnant.  If you drink alcohol: ? Limit how much you have to 0-1 drink a day. ? Be aware of how much alcohol is in your drink. In the [place] one drink equals one 12 oz bottle of beer (355 mL), one 5 oz glass of wine (148 mL), or one 1 oz glass of hard liquor (44 mL).   Lifestyle  Take daily care of your teeth and gums. Brush your teeth every morning and night with fluoride toothpaste. Floss one time each day.  Stay active. Exercise for at least 30 minutes 5 or more days each week.  Do not use any products that contain nicotine or tobacco, such as cigarettes, e-cigarettes, and chewing tobacco. If you need help quitting, ask your health care provider.  Do not use drugs.  If you are sexually active, practice safe sex. Use a condom or other form of protection to prevent STIs (sexually transmitted infections).  If you do not wish to become pregnant, use a form of birth control. If you plan to become pregnant, see your health care provider for a  prepregnancy visit.  Find healthy ways to cope with stress, such as: ? Meditation, yoga, or listening to music. ? Journaling. ? Talking to a trusted person. ? Spending time with friends and family. Safety  Always wear your seat belt while driving or riding in a vehicle.  Do not drive: ? If you have been drinking alcohol. Do not ride with someone who has been drinking. ? When you are tired or distracted. ? While texting.  Wear a helmet and other protective equipment during sports activities.  If you have firearms in your house, make sure you follow all gun safety procedures.  Seek help if you have been physically or sexually abused. What's next?  Go to your health care provider once a year for an annual wellness visit.  Ask your health care provider how often you should have your eyes and teeth checked.  Stay up to date on all vaccines. This information is not intended to replace advice given to you by your health care provider. Make sure you discuss any questions you have with your health care provider. Document Revised: 04/06/2020 Document Reviewed: 04/20/2018 Elsevier Patient Education  2021 [employer].

## 2020-11-25 LAB — PMP SCREEN PROFILE (10S), URINE
Amphetamine Scrn, Ur: NEGATIVE ng/mL
BARBITURATE SCREEN URINE: NEGATIVE ng/mL
BENZODIAZEPINE SCREEN, URINE: NEGATIVE ng/mL
CANNABINOIDS UR QL SCN: NEGATIVE ng/mL
Cocaine (Metab) Scrn, Ur: NEGATIVE ng/mL
Creatinine(Crt), U: 236.3 mg/dL (ref 20.0–300.0)
Methadone Screen, Urine: NEGATIVE ng/mL
OXYCODONE+OXYMORPHONE UR QL SCN: NEGATIVE ng/mL
Opiate Scrn, Ur: NEGATIVE ng/mL
Ph of Urine: 5.4 (ref 4.5–8.9)
Phencyclidine Qn, Ur: NEGATIVE ng/mL
Propoxyphene Scrn, Ur: NEGATIVE ng/mL

## 2020-11-25 LAB — HEPATITIS C ANTIBODY
Hepatitis C Ab: NONREACTIVE
SIGNAL TO CUT-OFF: 0.01 (ref <1.00)

## 2020-12-05 ENCOUNTER — Other Ambulatory Visit (HOSPITAL_COMMUNITY): Payer: Self-pay

## 2020-12-09 ENCOUNTER — Other Ambulatory Visit: Payer: Self-pay

## 2020-12-09 ENCOUNTER — Encounter: Payer: Self-pay | Admitting: Family Medicine

## 2020-12-09 MED ORDER — SUMATRIPTAN SUCCINATE 100 MG PO TABS: ORAL_TABLET | ORAL | 3 refills | Status: DC

## 2020-12-29 ENCOUNTER — Other Ambulatory Visit: Payer: Self-pay | Admitting: Neurology

## 2020-12-29 MED ORDER — TOPIRAMATE 50 MG PO TABS: 50.0000 mg | ORAL_TABLET | Freq: Every day | ORAL | 3 refills | Status: DC

## 2020-12-30 ENCOUNTER — Other Ambulatory Visit: Payer: Self-pay | Admitting: Family Medicine

## 2020-12-31 ENCOUNTER — Other Ambulatory Visit (HOSPITAL_COMMUNITY): Payer: Self-pay

## 2020-12-31 MED ORDER — LISDEXAMFETAMINE DIMESYLATE 40 MG PO CAPS: ORAL_CAPSULE | Freq: Every morning | ORAL | 0 refills | Status: DC

## 2021-01-13 IMAGING — DX CHEST - 2 VIEW
2 series · 2 of 2 positions shown · non-contrast
Comparison: None

CLINICAL DATA: Shortness of breath, cough

EXAM:
CHEST - 2 VIEW

[chest pa]
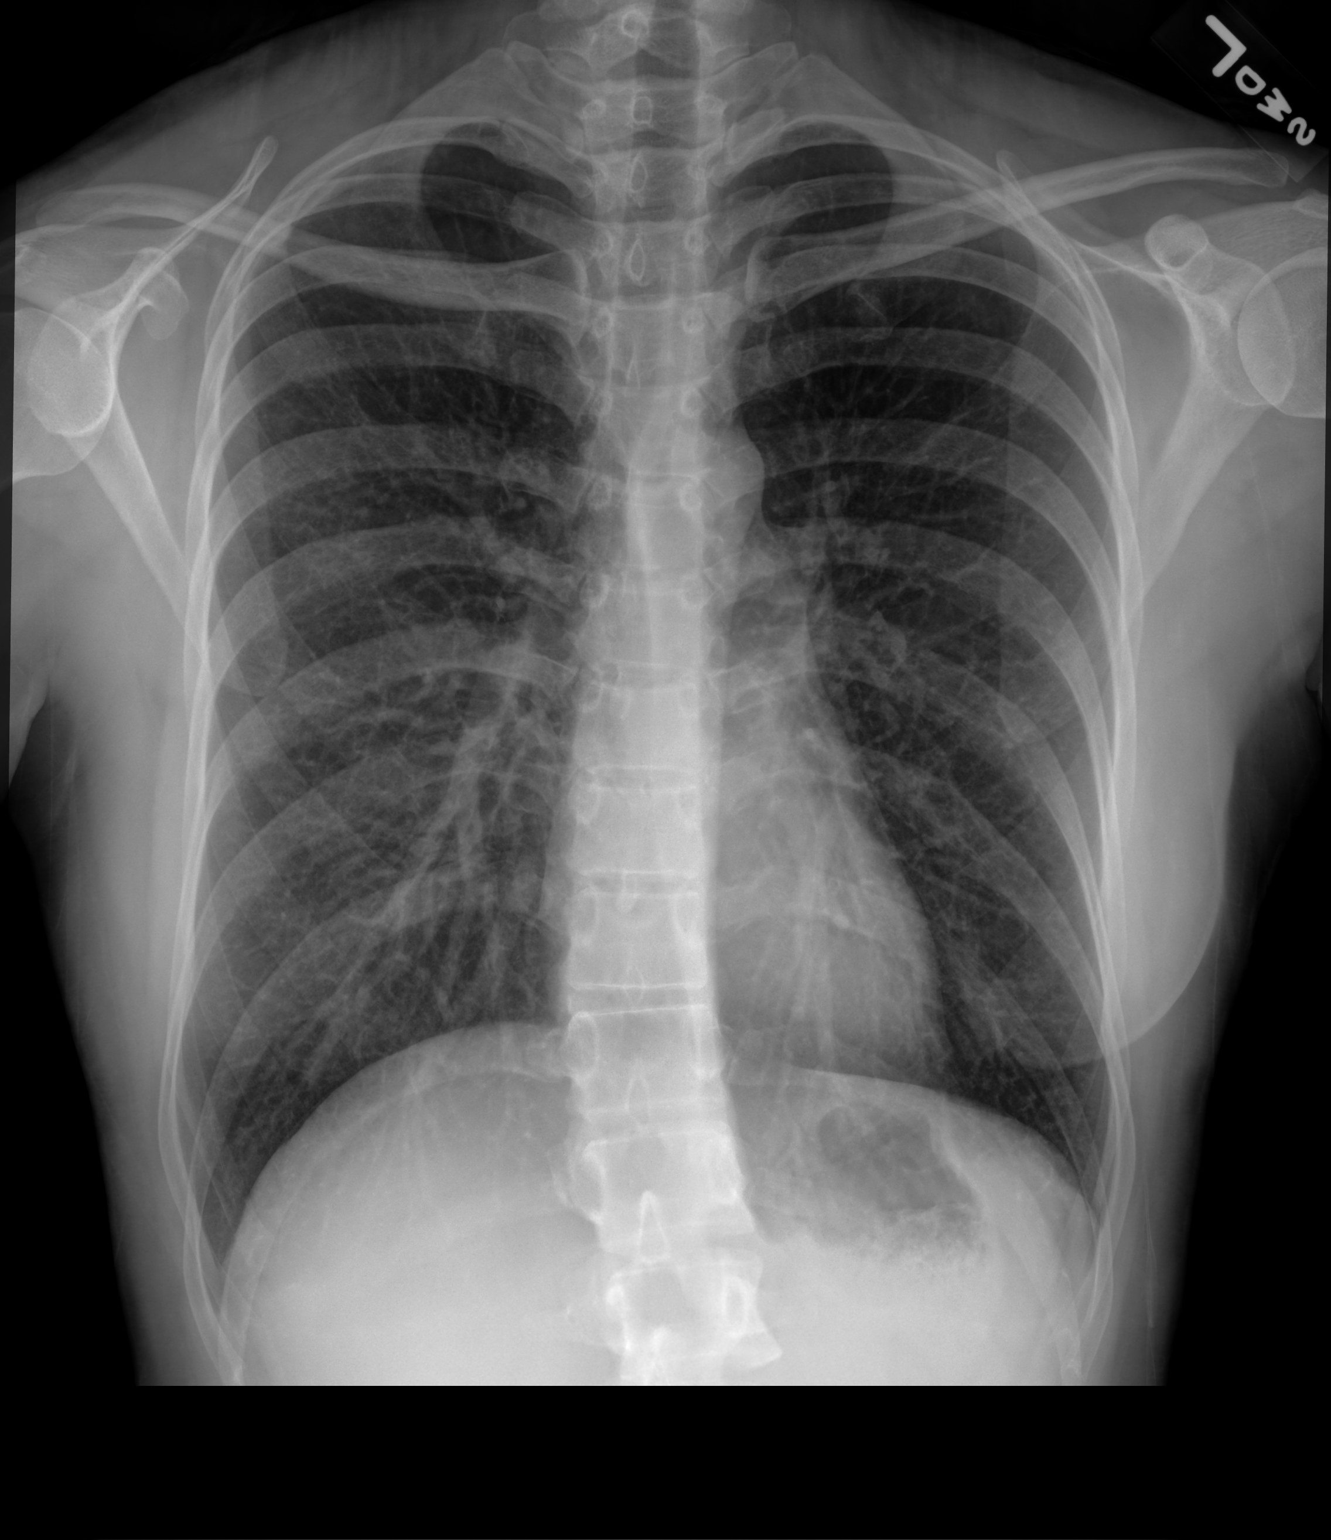

[chest lat]
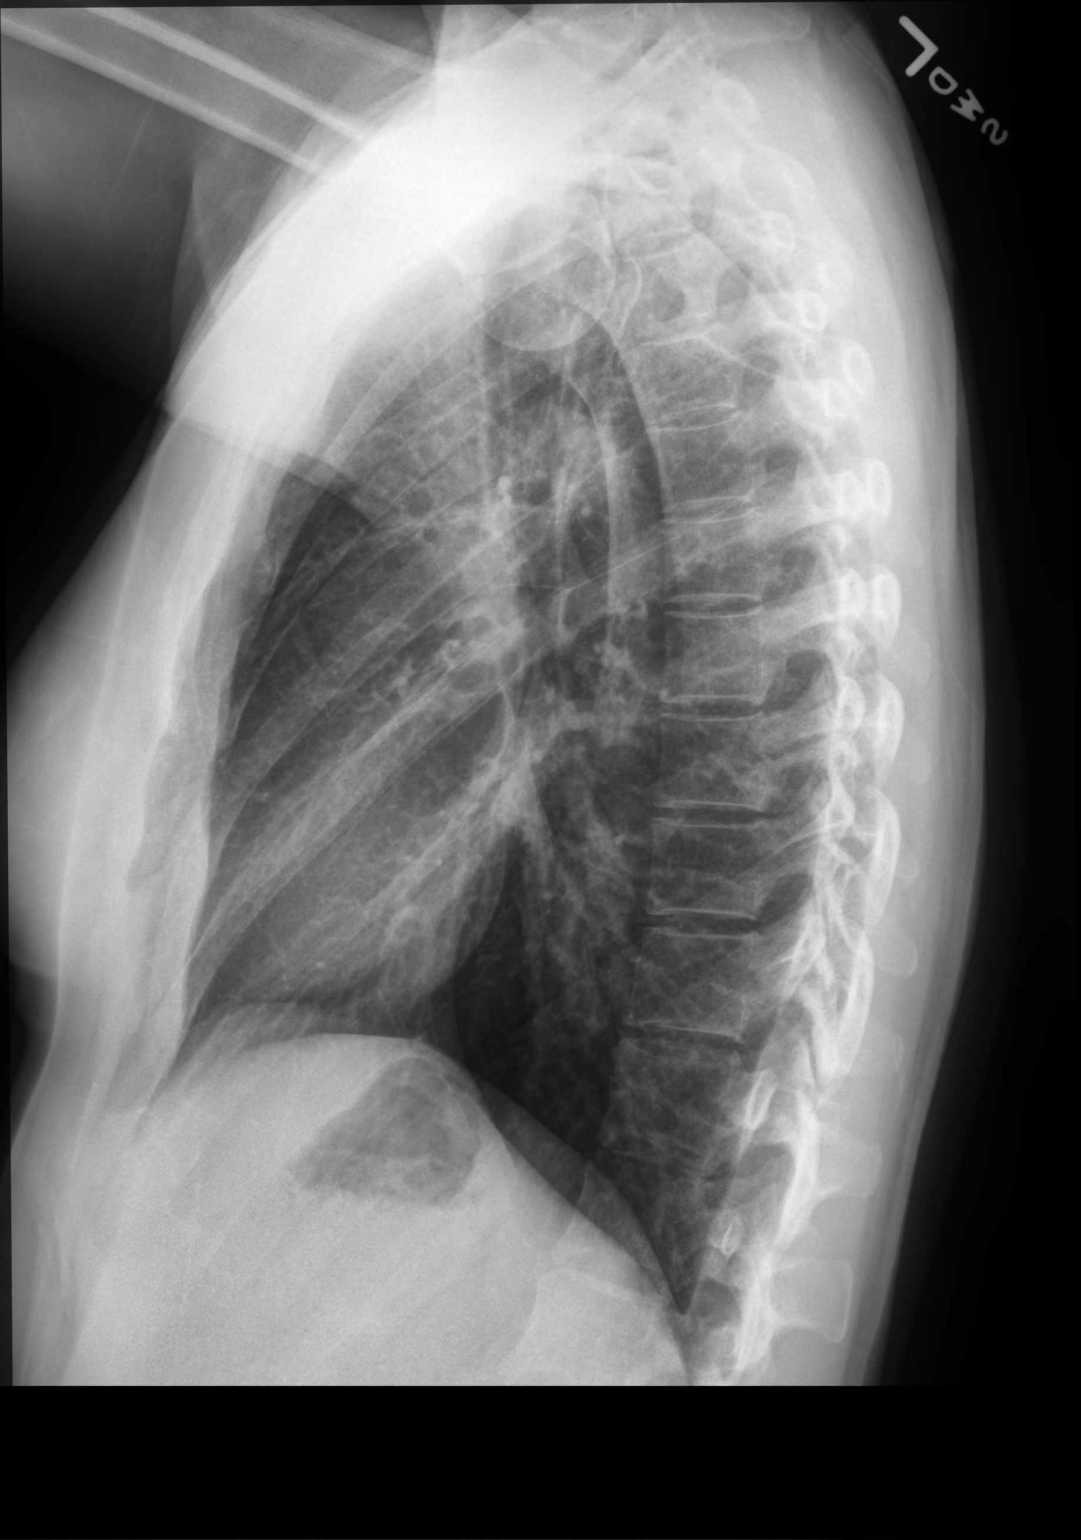

[2 of 2 positions shown; findings below may reference images not displayed]

FINDINGS: Heart and mediastinal contours are within normal limits. No focal
opacities or effusions. No acute bony abnormality.
IMPRESSION: No active cardiopulmonary disease.

## 2021-02-05 ENCOUNTER — Other Ambulatory Visit: Payer: Self-pay | Admitting: Family Medicine

## 2021-02-06 MED ORDER — LISDEXAMFETAMINE DIMESYLATE 40 MG PO CAPS: ORAL_CAPSULE | Freq: Every morning | ORAL | 0 refills | Status: DC

## 2021-02-06 NOTE — Telephone Encounter (Signed)
LAST APPOINTMENT DATE: 11/24/2020   NEXT APPOINTMENT DATE: Visit date not found    LAST REFILL: 12/31/2020  QTY: 30

## 2021-02-26 ENCOUNTER — Other Ambulatory Visit (HOSPITAL_COMMUNITY): Payer: Self-pay

## 2021-04-29 ENCOUNTER — Other Ambulatory Visit: Payer: Self-pay | Admitting: Family Medicine

## 2021-06-02 ENCOUNTER — Telehealth: Payer: Self-pay

## 2021-06-02 NOTE — Telephone Encounter (Signed)
LVM asking the patient to call back to get scheduled for a TOC 

## 2021-07-14 ENCOUNTER — Other Ambulatory Visit (HOSPITAL_COMMUNITY): Payer: Self-pay

## 2021-07-14 MED FILL — Cyclobenzaprine HCl Tab 10 MG: ORAL | 90 days supply | Qty: 90 | Fill #0 | Status: AC

## 2021-08-13 ENCOUNTER — Other Ambulatory Visit: Payer: Self-pay | Admitting: Family Medicine

## 2021-08-20 ENCOUNTER — Other Ambulatory Visit (HOSPITAL_COMMUNITY): Payer: Self-pay

## 2021-08-20 MED ORDER — MOUNJARO 5 MG/0.5ML ~~LOC~~ SOAJ
SUBCUTANEOUS | 0 refills | Status: DC
  Filled 2021-08-20: qty 2, 28d supply, fill #0

## 2021-09-10 ENCOUNTER — Other Ambulatory Visit: Payer: Self-pay | Admitting: Family Medicine

## 2021-09-10 ENCOUNTER — Other Ambulatory Visit: Payer: Self-pay

## 2021-09-10 ENCOUNTER — Encounter: Payer: Self-pay | Admitting: Family

## 2021-09-10 MED ORDER — SUMATRIPTAN SUCCINATE 100 MG PO TABS
ORAL_TABLET | ORAL | 2 refills | Status: DC
  Filled 2022-01-01: qty 9, 30d supply, fill #0

## 2021-10-22 ENCOUNTER — Telehealth: Payer: No Typology Code available for payment source | Admitting: Physician Assistant

## 2021-10-22 DIAGNOSIS — J208 Acute bronchitis due to other specified organisms: Secondary | ICD-10-CM

## 2021-10-22 DIAGNOSIS — B9689 Other specified bacterial agents as the cause of diseases classified elsewhere: Secondary | ICD-10-CM

## 2021-10-22 MED ORDER — BENZONATATE 100 MG PO CAPS: 100.0000 mg | ORAL_CAPSULE | Freq: Three times a day (TID) | ORAL | 0 refills | Status: DC | PRN

## 2021-10-22 MED ORDER — AZITHROMYCIN 250 MG PO TABS: ORAL_TABLET | ORAL | 0 refills | Status: AC

## 2021-10-22 NOTE — Progress Notes (Signed)

## 2021-10-22 NOTE — Progress Notes (Signed)
I have spent 5 minutes in review of e-visit questionnaire, review and updating patient chart, medical decision making and response to patient.   Shiraz Bastyr Cody Sary Bogie, PA-C    

## 2021-10-23 ENCOUNTER — Other Ambulatory Visit (HOSPITAL_COMMUNITY): Payer: Self-pay

## 2021-11-02 ENCOUNTER — Other Ambulatory Visit (HOSPITAL_COMMUNITY): Payer: Self-pay

## 2021-11-02 MED ORDER — TRIAMCINOLONE ACETONIDE 0.1 % EX CREA
TOPICAL_CREAM | CUTANEOUS | 2 refills | Status: DC
  Filled 2021-11-02 – 2022-02-26 (×2): qty 90, 30d supply, fill #0

## 2021-11-02 MED ORDER — PENICILLIN V POTASSIUM 500 MG PO TABS
ORAL_TABLET | ORAL | 0 refills | Status: DC
Start: 2021-11-02 — End: 2022-01-04
  Filled 2021-11-02: qty 30, 30d supply, fill #0

## 2021-11-11 ENCOUNTER — Other Ambulatory Visit (HOSPITAL_COMMUNITY): Payer: Self-pay

## 2021-11-22 ENCOUNTER — Encounter: Payer: Self-pay | Admitting: Family

## 2021-11-23 NOTE — Telephone Encounter (Signed)
if she will run out, ok to send 30 pills - and remind her of appt - but if not, prefer she come to appt and we refill- thx

## 2021-11-24 ENCOUNTER — Telehealth: Payer: Self-pay

## 2021-11-24 ENCOUNTER — Other Ambulatory Visit: Payer: Self-pay | Admitting: Family

## 2021-11-24 NOTE — Telephone Encounter (Signed)
Last visit: 11/24/20 ? ?Next visit: 11/25/21 ? ?Last filled: 08/13/21 ? ?Quantity: 90 w/ 0 refills  ?

## 2021-11-24 NOTE — Telephone Encounter (Signed)
is she completely out or can she wait until appt tomorrow? if yes, ok to send 30 pills

## 2021-11-24 NOTE — Telephone Encounter (Signed)
Pt states she will get refill tomorrow during her visit on 11/25/2021. ?

## 2021-11-24 NOTE — Telephone Encounter (Signed)
Lvm for patient to return call.

## 2021-11-25 ENCOUNTER — Other Ambulatory Visit (HOSPITAL_COMMUNITY): Payer: Self-pay

## 2021-11-25 ENCOUNTER — Encounter: Payer: Self-pay | Admitting: Family

## 2021-11-25 ENCOUNTER — Other Ambulatory Visit: Payer: Self-pay | Admitting: Family

## 2021-11-25 ENCOUNTER — Ambulatory Visit (INDEPENDENT_AMBULATORY_CARE_PROVIDER_SITE_OTHER): Payer: No Typology Code available for payment source | Admitting: Family

## 2021-11-25 VITALS — BP 119/83 | HR 84 | Temp 98.2°F | Ht 67.0 in | Wt 144.2 lb

## 2021-11-25 DIAGNOSIS — F411 Generalized anxiety disorder: Secondary | ICD-10-CM

## 2021-11-25 DIAGNOSIS — F900 Attention-deficit hyperactivity disorder, predominantly inattentive type: Secondary | ICD-10-CM

## 2021-11-25 DIAGNOSIS — L299 Pruritus, unspecified: Secondary | ICD-10-CM | POA: Insufficient documentation

## 2021-11-25 DIAGNOSIS — L404 Guttate psoriasis: Secondary | ICD-10-CM | POA: Insufficient documentation

## 2021-11-25 MED ORDER — HYDROXYZINE HCL 25 MG PO TABS
25.0000 mg | ORAL_TABLET | Freq: Every day | ORAL | 1 refills | Status: DC
  Filled 2021-11-25: qty 90, 90d supply, fill #0

## 2021-11-25 MED ORDER — LISDEXAMFETAMINE DIMESYLATE 40 MG PO CAPS: 40.0000 mg | ORAL_CAPSULE | Freq: Every day | ORAL | 0 refills | Status: DC

## 2021-11-25 MED ORDER — SERTRALINE HCL 100 MG PO TABS
100.0000 mg | ORAL_TABLET | Freq: Every day | ORAL | 1 refills | Status: DC
  Filled 2021-11-25: qty 90, 90d supply, fill #0
  Filled 2022-02-10: qty 90, 90d supply, fill #1

## 2021-11-25 MED ORDER — LISDEXAMFETAMINE DIMESYLATE 40 MG PO CAPS
40.0000 mg | ORAL_CAPSULE | Freq: Every day | ORAL | 0 refills | Status: DC
  Filled 2022-02-10 – 2022-02-11 (×2): qty 30, 30d supply, fill #0

## 2021-11-25 MED ORDER — LISDEXAMFETAMINE DIMESYLATE 40 MG PO CAPS
40.0000 mg | ORAL_CAPSULE | Freq: Every day | ORAL | 0 refills | Status: DC
  Filled 2021-11-25: qty 30, 30d supply, fill #0

## 2021-11-25 NOTE — Assessment & Plan Note (Signed)
Chronic - Vyvanse qd - sending refills today, f/u in 3 mos. ?

## 2021-11-25 NOTE — Assessment & Plan Note (Addendum)
r/t ipsoriasis, t takes Vistaril and OTC antihistamine daily. sending refill today, f/u in 71mos. ?

## 2021-11-25 NOTE — Assessment & Plan Note (Signed)
Chronic - stable on Zoloft, sending refill today, f/u in 6 mos. ?

## 2021-11-25 NOTE — Patient Instructions (Addendum)
It was very nice to see you today! ? ?I have sent your refills to your pharmacy. ? ?Please schedule 3-4 month follow up visit today for refills and/or physical w/fasting labs. ? ? ? ?PLEASE NOTE: ? ?If you had any lab tests please let us know if you have not heard back within a few days. You may see your results on MyChart before we have a chance to review them but we will give you a call once they are reviewed by [place]. If we ordered any referrals today, please let us know if you have not heard from their office within the next week.  ? ?Please try these tips to maintain a healthy lifestyle: ? ?Eat most of your calories during the day when you are active. Eliminate processed foods including packaged sweets (pies, cakes, cookies), reduce intake of potatoes, white bread, white pasta, and white rice. Look for whole grain options, oat flour or almond flour. ? ?Each meal should contain half fruits/vegetables, one quarter protein, and one quarter carbs (no bigger than a computer mouse). ? ?Cut down on sweet beverages. This includes juice, soda, and sweet tea. Also watch fruit intake, though this is a healthier sweet option, it still contains natural sugar! Limit to 3 servings daily. ? ?Drink at least 1 glass of water with each meal and aim for at least 8 glasses per day ? ?Exercise at least 150 minutes every week.  ? ?

## 2021-11-25 NOTE — Progress Notes (Signed)
? ?Subjective:  ? ? ? Patient ID: Alison Nichols, female    DOB: Aug 11, 1986, 36 y.o.   MRN: [id number] ? ?Chief Complaint  ?Patient presents with  ? Transfer of care  ? ADHD  ?  Refill Vyvanse  ? ?HPI: ?Psoriasis: Patient complains of a rash. Symptoms began  years ago. Patient describes the rash as scattered. Characteristics of rash and associated history: under care of [facility]. Medications currently using: moisturizing cream, topical antibiotic, triamcinolone, and antipruritic oral meds . Environmental exposures or allergies: none  ?Anxiety/Depression: Patient complains of anxiety disorder and hx of depression .   ?She has the following symptoms: none.  ?Onset of symptoms was approximately  years ago, She denies current suicidal and homicidal ideation.  ?Possible organic causes contributing are: none.  ?Risk factors: previous episode of depression  ?Previous treatment includes Zoloft and Vistaril .  She complains of the following side effects from the treatment: none. ? ?  11/25/2021  ?  3:07 PM  ?Depression screen PHQ 2/9  ?Decreased Interest 0  ?Down, Depressed, Hopeless 0  ?PHQ - 2 Score 0  ? ? ?  11/25/2021  ?  3:41 PM  ?GAD 7 : Generalized Anxiety Score  ?Nervous, Anxious, on Edge 0  ?Control/stop worrying 0  ?Worry too much - different things 0  ?Trouble relaxing 1  ?Restless 0  ?Easily annoyed or irritable 2  ?Afraid - awful might happen 0  ?Total GAD 7 Score 3  ?Anxiety Difficulty Not difficult at all  ?ADHD f/u: ?Medications helping target goals: Vyvanse - over a year ?Regimen: daily, work days mostly ?Medication side effects/concerns: none ?Weight: no change ?Sleep: denies issues ?Mood changes: denies ?Tics: denies ?Blood pressure, Weight, Pulse, Behavior reviewed: wnl ?  ?  ?Health Maintenance Due  ?Topic Date Due  ? PAP SMEAR-Modifier  08/10/2020  ? ? ?Past Medical History:  ?Diagnosis Date  ? Acute upper respiratory infection 11/04/2010  ? Formatting of this note might be different from the original. Upper  Respiratory Infection  10/1 IMO update  ? Adjustment disorder with anxiety 11/06/2010  ? Formatting of this note might be different from the original. Adjustment Disorder With Anxiety  ? Anxiety   ? Asthma   ? 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 Bilateral impacted cerumen 10/04/2017  ? Bullous myringitis of left ear 10/04/2017  ? Complication of anesthesia   ? COVID-19 01/2019  ? scrachy throat x few days all symptoms resolved  ? Depression   ? Ehlers-Danlos disease   ? Fatigue 06/20/2014  ? Gastroenteritis, non-infectious 06/20/2014  ? Glycosuria 06/20/2014  ? Headache   ? History of gestational hypertension 2019  ? History of TB skin testing 09/22/2015  ? History of varicella infection 06/20/2014  ? IBS (irritable bowel syndrome)   ? Lower urinary tract infectious disease 06/20/2014  ? Formatting of this note might be different from the original. IMO Problem List Replacer Jan. 2016 Formatting of this note might be different from the original. Formatting of this note might be different from the original. IMO Problem List Replacer Jan. 2016  ? Lumbar pain 06/20/2014  ? Migraines   ? Nausea 06/20/2014  ? Pharyngitis 09/30/2017  ? PONV (postoperative nausea and vomiting)   ? 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 Formatting of this note might be different from the original. Adjustment Disorder With Anxiety And Prolonged Depressed Mood  ? Scoliosis   ? Vaginal Pap smear, abnormal   ? Wears glasses  09/22/2015  ? ? ?Past Surgical History:  ?Procedure Laterality Date  ? CHOLECYSTECTOMY  2002  ? COLPOSCOPY  2004  ? COMBINED HYSTEROSCOPY DIAGNOSTIC / D&C  2004  ? DILITATION & CURRETTAGE/HYSTROSCOPY WITH HYDROTHERMAL ABLATION N/A 05/29/2020  ? Procedure: DILATATION & CURETTAGE/HYSTEROSCOPY WITH NOVASURE ABLATION;  Surgeon: Mitchel Honour, DO;  Location: [facility];  Service: Gynecology;  Laterality: N/A;  ? HYSTEROSCOPY WITH D & C  2004  ? KNEE  ARTHROSCOPY Right 1998 & 1999  ? LAPAROSCOPIC TUBAL LIGATION Bilateral 05/29/2020  ? Procedure: BILATERAL LAPAROSCOPIC TUBAL LIGATION WITH FILSHIE CLIPS;  Surgeon: Mitchel Honour, DO;  Location: [facility];  Service: Gynecology;  Laterality: Bilateral;  ? sinus sugrery  1998  ? TONSILLECTOMY  as child  ? w/ Adenoidectomy  ? ? ?Outpatient Medications Prior to Visit  ?Medication Sig Dispense Refill  ? penicillin v potassium (VEETID) 500 MG tablet Take 1 tablet by mouth once a day 30 days. 30 tablet 0  ? SUMAtriptan (IMITREX) 100 MG tablet TAKE 1 TABLET BY MOUTH AT ONSET OF HEADACHE. MAY REPEAT IN 2 HOURS IF HEADACHE PERSISTS OR RECURS. NO MORE THAN 2 TABLET IN 24 HOURS 9 tablet 2  ? topiramate (TOPAMAX) 50 MG tablet Take 1 tablet (50 mg total) by mouth at bedtime. 90 tablet 3  ? triamcinolone cream (KENALOG) 0.1 % Apply to the affected area(s) 2 times weekly as needed 90 g 2  ? hydrOXYzine (ATARAX) 25 MG tablet Take 25 mg by mouth daily.    ? lisdexamfetamine (VYVANSE) 40 MG capsule TAKE 1 CAPSULE BY MOUTH EVERY MORNING. 30 capsule 0  ? sertraline (ZOLOFT) 100 MG tablet TAKE 1 TABLET BY MOUTH AT BEDTIME 90 tablet 0  ? albuterol (VENTOLIN HFA) 108 (90 Base) MCG/ACT inhaler INHALE 2 PUFFS BY MOUTH EVERY 6 HOURS AS NEEDED FOR WHEEZING OR SHORTNESS OF BREATH 18 g 0  ? benzonatate (TESSALON) 100 MG capsule Take 1 capsule (100 mg total) by mouth 3 (three) times daily as needed for cough. 30 capsule 0  ? nystatin-triamcinolone ointment (MYCOLOG) Apply 1 application topically 2 (two) times daily. 30 g 0  ? tirzepatide (MOUNJARO) 5 MG/0.5ML Pen Inject 5 mg into the skin once a week after finishing 2.5 mg dose 2 mL 0  ? Vitamin D, Ergocalciferol, (DRISDOL) 1.25 MG (50000 UNIT) CAPS capsule One capsule by mouth once a week for 12 weeks.  (Then take 5000IU of  D3 daily) 12 capsule 0  ? ?No facility-administered medications prior to visit.  ? ? ?Allergies  ?Allergen Reactions  ? Cephalexin Hives  ?  Keflex is hives   ? ?   ?Objective:  ?  ?Physical Exam ?Vitals and nursing note reviewed.  ?Constitutional:   ?   Appearance: Normal appearance.  ?Cardiovascular:  ?   Rate and Rhythm: Normal rate and regular rhythm.  ?Pulmonary:  ?   Effort: Pulmonary effort is normal.  ?   Breath sounds: Normal breath sounds.  ?Musculoskeletal:     ?   General: Normal range of motion.  ?Skin: ?   General: Skin is warm and dry.  ?Neurological:  ?   Mental Status: She is alert.  ?Psychiatric:     ?   Mood and Affect: Mood normal.     ?   Behavior: Behavior normal.  ? ? ?BP 119/83 (BP Location: Left Arm, Patient Position: Sitting, Cuff Size: Large)   Pulse 84   Temp 98.2 ?F (36.8 ?C) (Temporal)   Ht 5'  7" (1.702 m)   Wt 144 lb 4 oz (65.4 kg)   SpO2 99%   BMI 22.59 kg/m?  ?Wt Readings from Last 3 Encounters:  ?11/25/21 144 lb 4 oz (65.4 kg)  ?11/24/20 164 lb 12.8 oz (74.8 kg)  ?09/24/20 164 lb (74.4 kg)  ? ? ?   ?Assessment & Plan:  ? ?Problem List Items Addressed This Visit   ? ?  ? Other  ? ADHD, predominantly inattentive type - Primary  ?  Chronic - Vyvanse qd - sending refills today, f/u in 3 mos. ?  ?  ? Relevant Medications  ? lisdexamfetamine (VYVANSE) 40 MG capsule  ? lisdexamfetamine (VYVANSE) 40 MG capsule (Start on 12/25/2021)  ? lisdexamfetamine (VYVANSE) 40 MG capsule (Start on 01/24/2022)  ? Generalized anxiety disorder  ?  Chronic - stable on Zoloft, sending refill today, f/u in 6 mos. ?  ?  ? Relevant Medications  ? hydrOXYzine (ATARAX) 25 MG tablet  ? sertraline (ZOLOFT) 100 MG tablet  ? Chronic pruritus  ?  r/t ipsoriasis, t takes Vistaril and OTC antihistamine daily. sending refill today, f/u in 7mos. ?  ?  ? Relevant Medications  ? hydrOXYzine (ATARAX) 25 MG tablet  ? ? ?Meds ordered this encounter  ?Medications  ? lisdexamfetamine (VYVANSE) 40 MG capsule  ?  Sig: Take 1 capsule (40 mg total) by mouth daily before breakfast.  ?  Dispense:  30 capsule  ?  Refill:  0  ?  Order Specific Question:   Supervising Provider  ?  Answer:    ANDY, CAMILLE L [[id number]]  ? lisdexamfetamine (VYVANSE) 40 MG capsule  ?  Sig: Take 1 capsule by mouth daily before breakfast.  ?  Dispense:  30 capsule  ?  Refill:  0  ?  Order Specific Question:   Supervising Provide

## 2021-11-26 ENCOUNTER — Other Ambulatory Visit (HOSPITAL_COMMUNITY): Payer: Self-pay

## 2022-01-01 ENCOUNTER — Other Ambulatory Visit (HOSPITAL_BASED_OUTPATIENT_CLINIC_OR_DEPARTMENT_OTHER): Payer: Self-pay

## 2022-01-04 ENCOUNTER — Encounter: Payer: Self-pay | Admitting: Neurology

## 2022-01-04 ENCOUNTER — Other Ambulatory Visit (HOSPITAL_BASED_OUTPATIENT_CLINIC_OR_DEPARTMENT_OTHER): Payer: Self-pay

## 2022-01-04 ENCOUNTER — Ambulatory Visit: Payer: No Typology Code available for payment source | Admitting: Neurology

## 2022-01-04 ENCOUNTER — Encounter (HOSPITAL_BASED_OUTPATIENT_CLINIC_OR_DEPARTMENT_OTHER): Payer: Self-pay

## 2022-01-04 VITALS — BP 126/77 | HR 91 | Ht 67.0 in | Wt 144.2 lb

## 2022-01-04 DIAGNOSIS — G43009 Migraine without aura, not intractable, without status migrainosus: Secondary | ICD-10-CM

## 2022-01-04 HISTORY — DX: Migraine without aura, not intractable, without status migrainosus: G43.009

## 2022-01-04 MED ORDER — NURTEC 75 MG PO TBDP
75.0000 mg | ORAL_TABLET | Freq: Every day | ORAL | 12 refills | Status: DC | PRN
  Filled 2022-01-04: qty 9, 30d supply, fill #0
  Filled 2022-01-05: qty 16, 30d supply, fill #0
  Filled 2022-03-16: qty 8, 15d supply, fill #1

## 2022-01-04 MED ORDER — SUMATRIPTAN SUCCINATE 100 MG PO TABS
ORAL_TABLET | ORAL | 11 refills | Status: DC
  Filled 2022-01-04 – 2022-02-10 (×2): qty 9, 30d supply, fill #0
  Filled 2022-03-22: qty 9, 30d supply, fill #1

## 2022-01-04 MED ORDER — ONDANSETRON 4 MG PO TBDP
4.0000 mg | ORAL_TABLET | Freq: Three times a day (TID) | ORAL | 3 refills | Status: DC | PRN
  Filled 2022-01-04: qty 30, 5d supply, fill #0

## 2022-01-04 MED ORDER — TOPIRAMATE 50 MG PO TABS
50.0000 mg | ORAL_TABLET | Freq: Every day | ORAL | 3 refills | Status: DC
  Filled 2022-01-04: qty 90, 90d supply, fill #0

## 2022-01-04 NOTE — Progress Notes (Signed)
?Alison Nichols ? ? ? ?Provider:  Dr Lucia Gaskins ?Requesting Provider: No ref. provider found ?Primary Care Provider:  [name], NP ? ?CC:  Migraines ? ?Interval history 01/04/2022:   here for f/u of migraines. In 2021 baseline was makes   15 migraine days a month, 20 headache days a month for > 1 year that are moderately severe or severe and affecting her life significantly. Since then we had her multiple medications and now having 6 migraine days a month still cyclical during her period. Triptan works but she wanted to try something different. She feels much better. 6 migraines can still cause a significant burden monthly, we discussed increasing the topiramate, trying some other medications such as Qulipta, we discussed different acute management medications and decided on trying Nurtec. ? ?Patient complains of symptoms per HPI as well as the following symptoms: migraines, improved . Pertinent negatives and positives per HPI. All others negative ? ? ?Medications tried: Topamax, gabapentin, amitriptyline, propranolol or any other blood pressure medication contraindicated due to hypotension, Tylenol, aspirin, baclofen, Fioricet, Flexeril, Decadron injection, Benadryl capsule and injection, Aimovig, Emgality, ibuprofen, magnesium, Robaxin, Reglan injections, Zofran, prednisone tablets, Phenergan, Zoloft, Imitrex, tizanidine, Topamax, rizatriptan, ? ?Interval history 09/24/2020: Here for a new problem neck pain. PMHx migraines, chiari malformation. She had one injection of botox for migraines in 03/2020 but none since. The botox helped but she is waiting for new insurance. She liked Ajovy. Imitrex works acutely. Migraines are back to their baseline since she is not on anything. She is having a lot of neck pain with the headaches. She did not go to dry needling. She can have severe neck pain, But one day she woke up with her neck she has radiation down the right arm, weakness in the right arm, she has  tried conservative measures for > 3 months,  ? ?MRI brain normal, personally reviewed images ? ?HPI 12/27/2019:  Alison Nichols is a 36 y.o. female here as requested by No ref. provider found for migraines. PMHx chiari malformation.Started around the age of 1 they were severe and sent her to the ED vomiting, she was on several medications, they improved in her early 31s and then started worsening a few years ago and continue to worsen. They are most often during menses but also throughout the month. Migraines are on the right, she has a lot of neck pain and starts there, they are pulsating/pounding/throbbing, photo/phono nausea vomiting, movement makes them worse and they last 24-72 hours, no medication overuse,  15 migraine days a month, 20 headache days a month for > 1 year that are moderately severe or severe and affecting her life significantly. She tried continuous birth control but anything she tries makes the migraines worse even progesterone only IUD. Her headaches were better with her first 2 pregnanies. Blurry vision with the migraines. She does not have auras except rarely, she has morning headaches, bending over makes her headaches much worse. She wakes often with them and once she gets up they may get better. They can last 24-72 hours. Imitrex helps. The migraines are moderately severe to severe and affects her life. Sinuses are not affecting her, she reports no sinus issues. No other focal neurologic deficits, associated symptoms, inciting events or modifiable factors. ? ?Medications tried: Topamax, gabapentin, amitriptyline, propranolol or any other blood pressure medication contraindicated due to hypotension ? ?Reviewed notes, labs and imaging from outside physicians, which showed: ? ?CMP unremarkable (slighty low Na 133 stable), tsh normal 07/27/2019 ? ?  CT Maxillofacial reviewed images and agree with the following: ? ?Clinical data: Sinusitis and cough.  ?LIMITED CT SCAN OF THE PARANASAL SINUSES  WITHOUT CONTRAST:  ?Scans were performed in the axial plane through the paranasal sinuses. The paranasal sinuses are well visualized and there is no significant opacification. There is some very minimal mucosal thickening and a few tiny ethmoid air cells, but there are no air fluid levels or significant mucosal thickening. There is no significant nasal septal deviation. The mastoid air cells and middle ear cavities are clear.   ?IMPRESSION:  ?Essentially normal exam of the paranasal sinuses ? ?Review of Systems: ?Patient complains of symptoms per HPI as well as the following symptoms: neck pain, headaches . Pertinent negatives and positives per HPI. All others negative ? ? ? ?Social History  ? ?Socioeconomic History  ? Marital status: Married  ?  Spouse name: Not on file  ? Number of children: 3  ? Years of education: Not on file  ? Highest education level: Bachelor's degree (e.g., BA, AB, BS)  ?Occupational History  ? Not on file  ?Tobacco Use  ? Smoking status: Never  ? Smokeless tobacco: Never  ?Vaping Use  ? Vaping Use: Never used  ?Substance and Sexual Activity  ? Alcohol use: Never  ? Drug use: Never  ? Sexual activity: Yes  ?  Birth control/protection: Surgical  ?  Comment: tubal ligation  ?Other Topics Concern  ? Not on file  ?Social History Narrative  ? Lives at home with husband & 3 children  ? Right handed  ? Caffeine: 2 cups per day, diet coke  ? ?Social Determinants of Health  ? ?Financial Resource Strain: Not on file  ?Food Insecurity: Not on file  ?Transportation Needs: Not on file  ?Physical Activity: Not on file  ?Stress: Not on file  ?Social Connections: Not on file  ?Intimate Partner Violence: Not on file  ? ? ?Family History  ?Problem Relation Age of Onset  ? Heart failure Maternal Grandmother   ? Hypertension Maternal Grandmother   ? Thyroid disease Maternal Grandmother   ? Arthritis Maternal Grandmother   ? Heart attack Maternal Grandmother   ? CAD Maternal Grandmother   ? Migraines Maternal  Grandmother   ? Depression Mother   ? Hyperlipidemia Mother   ? Hypertension Mother   ? Migraines Mother   ? Hypertension Maternal Grandfather   ? Kidney disease Maternal Grandfather   ? Epilepsy Maternal Grandfather   ? ? ?Past Medical History:  ?Diagnosis Date  ? Acute upper respiratory infection 11/04/2010  ? Formatting of this note might be different from the original. Upper Respiratory Infection  10/1 IMO update  ? Adjustment disorder with anxiety 11/06/2010  ? Formatting of this note might be different from the original. Adjustment Disorder With Anxiety  ? Anxiety   ? Asthma   ? 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 Bilateral impacted cerumen 10/04/2017  ? Bullous myringitis of left ear 10/04/2017  ? Complication of anesthesia   ? COVID-19 01/2019  ? scrachy throat x few days all symptoms resolved  ? Depression   ? Ehlers-Danlos disease   ? Fatigue 06/20/2014  ? Gastroenteritis, non-infectious 06/20/2014  ? Glycosuria 06/20/2014  ? Headache   ? History of gestational hypertension 2019  ? History of TB skin testing 09/22/2015  ? History of varicella infection 06/20/2014  ? IBS (irritable bowel syndrome)   ? Lower urinary tract infectious disease 06/20/2014  ? Formatting of  this note might be different from the original. IMO Problem List Replacer Jan. 2016 Formatting of this note might be different from the original. Formatting of this note might be different from the original. IMO Problem List Replacer Jan. 2016  ? Lumbar pain 06/20/2014  ? Migraines   ? Nausea 06/20/2014  ? Pharyngitis 09/30/2017  ? PONV (postoperative nausea and vomiting)   ? 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 Formatting of this note might be different from the original. Adjustment Disorder With Anxiety And Prolonged Depressed Mood  ? Scoliosis   ? Vaginal Pap smear, abnormal   ? Wears glasses 09/22/2015  ? ? ?Patient Active Problem List  ? Diagnosis Date Noted  ? Migraine without  aura and without status migrainosus, not intractable 01/04/2022  ? 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 Generalized anxiety disorder 11/25/2021  ? Chronic pruritus 11/25/2021  ? 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

## 2022-01-04 NOTE — Patient Instructions (Addendum)
Start Nurtec as needed. Can take with Ondansetron and alleve if needed and with imitrex if needed ?Could also consider Bernita Raisin ?Can consider increasing Topiramate in the future ? ?Meds ordered this encounter  ?Medications  ? Rimegepant Sulfate (NURTEC) 75 MG TBDP  ?  Sig: Take 75 mg by mouth daily as needed. For migraines. Take as close to onset of migraine as possible. One daily maximum.  ?  Dispense:  16 tablet  ?  Refill:  12  ? ondansetron (ZOFRAN-ODT) 4 MG disintegrating tablet  ?  Sig: Take 1-2 tablets (4-8 mg total) by mouth every 8 (eight) hours as needed.  ?  Dispense:  30 tablet  ?  Refill:  3  ? SUMAtriptan (IMITREX) 100 MG tablet  ?  Sig: TAKE 1 TABLET BY MOUTH AT ONSET OF HEADACHE. MAY REPEAT IN 2 HOURS IF HEADACHE PERSISTS OR RECURS. NO MORE THAN 2 TABLET IN 24 HOURS  ?  Dispense:  9 tablet  ?  Refill:  11  ? topiramate (TOPAMAX) 50 MG tablet  ?  Sig: Take 1 tablet (50 mg total) by mouth at bedtime.  ?  Dispense:  90 tablet  ?  Refill:  3  ? ? ? ?Rimegepant Disintegrating Tablets ?What is this medication? ?RIMEGEPANT (ri ME je pant) prevents and treats migraines. It works by blocking a substance in the body that causes migraines. ?This medicine may be used for other purposes; ask your health care provider or pharmacist if you have questions. ?COMMON BRAND NAME(S): NURTEC ODT ?What should I tell my care team before I take this medication? ?They need to know if you have any of these conditions: ?Kidney disease ?Liver disease ?An unusual or allergic reaction to rimegepant, other medications, foods, dyes, or preservatives ?Pregnant or trying to get pregnant ?Breast-feeding ?How should I use this medication? ?Take this medication by mouth. Take it as directed on the prescription label. Leave the tablet in the sealed pack until you are ready to take it. With dry hands, open the pack and gently remove the tablet. If the tablet breaks or crumbles, throw it away. Use a new tablet. Place the tablet in the mouth  and allow it to dissolve. Then, swallow it. Do not cut, crush, or chew this medication. You do not need water to take this medication. ?Talk to your care team about the use of this medication in children. Special care may be needed. ?Overdosage: If you think you have taken too much of this medicine contact a poison control center or emergency room at once. ?NOTE: This medicine is only for you. Do not share this medicine with others. ?What if I miss a dose? ?This does not apply. This medication is not for regular use. ?What may interact with this medication? ?Certain medications for fungal infections, such as fluconazole, itraconazole ?Rifampin ?This list may not describe all possible interactions. Give your health care provider a list of all the medicines, herbs, non-prescription drugs, or dietary supplements you use. Also tell them if you smoke, drink alcohol, or use illegal drugs. Some items may interact with your medicine. ?What should I watch for while using this medication? ?Visit your care team for regular checks on your progress. Tell your care team if your symptoms do not start to get better or if they get worse. ?What side effects may I notice from receiving this medication? ?Side effects that you should report to your care team as soon as possible: ?Allergic reactions--skin rash, itching, hives, swelling of the  face, lips, tongue, or throat ?Side effects that usually do not require medical attention (report to your care team if they continue or are bothersome): ?Nausea ?Stomach pain ?This list may not describe all possible side effects. Call your doctor for medical advice about side effects. You may report side effects to FDA at 1-800-FDA-1088. ?Where should I keep my medication? ?Keep out of the reach of children and pets. ?Store at room temperature between 20 and 25 degrees C (68 and 77 degrees F). Get rid of any unused medication after the expiration date. ?To get rid of medications that are no longer  needed or have expired: ?Take the medication to a medication take-back program. Check with your pharmacy or law enforcement to find a location. ?If you cannot return the medication, check the label or package insert to see if the medication should be thrown out in the garbage or flushed down the toilet. If you are not sure, ask your care team. If it is safe to put it in the trash, take the medication out of the container. Mix the medication with cat litter, dirt, coffee grounds, or other unwanted substance. Seal the mixture in a bag or container. Put it in the trash. ?NOTE: This sheet is a summary. It may not cover all possible information. If you have questions about this medicine, talk to your doctor, pharmacist, or health care provider. ?? 2023 Elsevier/Gold Standard (2021-09-30 00:00:00) ? ?

## 2022-01-05 ENCOUNTER — Other Ambulatory Visit (HOSPITAL_BASED_OUTPATIENT_CLINIC_OR_DEPARTMENT_OTHER): Payer: Self-pay

## 2022-01-21 ENCOUNTER — Other Ambulatory Visit: Payer: Self-pay | Admitting: Neurology

## 2022-01-21 DIAGNOSIS — G43009 Migraine without aura, not intractable, without status migrainosus: Secondary | ICD-10-CM

## 2022-01-26 ENCOUNTER — Telehealth: Payer: No Typology Code available for payment source | Admitting: Physician Assistant

## 2022-01-26 ENCOUNTER — Other Ambulatory Visit (HOSPITAL_BASED_OUTPATIENT_CLINIC_OR_DEPARTMENT_OTHER): Payer: Self-pay

## 2022-01-26 DIAGNOSIS — J069 Acute upper respiratory infection, unspecified: Secondary | ICD-10-CM | POA: Diagnosis not present

## 2022-01-26 DIAGNOSIS — B9689 Other specified bacterial agents as the cause of diseases classified elsewhere: Secondary | ICD-10-CM | POA: Diagnosis not present

## 2022-01-26 MED ORDER — BENZONATATE 100 MG PO CAPS
100.0000 mg | ORAL_CAPSULE | Freq: Three times a day (TID) | ORAL | 0 refills | Status: DC | PRN
  Filled 2022-01-26: qty 30, 10d supply, fill #0

## 2022-01-26 MED ORDER — AZITHROMYCIN 250 MG PO TABS
ORAL_TABLET | ORAL | 0 refills | Status: AC
Start: 2022-01-26 — End: 2022-01-31
  Filled 2022-01-26: qty 6, 5d supply, fill #0

## 2022-01-26 NOTE — Progress Notes (Signed)
I have spent 5 minutes in review of e-visit questionnaire, review and updating patient chart, medical decision making and response to patient.   Spero Gunnels Cody Aeron Donaghey, PA-C    

## 2022-01-26 NOTE — Progress Notes (Signed)
We are sorry that you are not feeling well.  Here is how we plan to help!  Based on your presentation I believe you most likely have a bacterial URI. I want you to keep hydrated and try to rest. You can use Mucinex OTC to help thin out any congestion. Start a saline nasal rinse for any lingering nasal congestion. Salt-water gargles, chloraseptic spray and alternating tylenol and ibuprofen can help with throat pain, headache, etc. I have sent in a prescription antibiotic, Azithromycin, for you to take as directed along with a cough medication, tessalon, to take up to three times daily (safe to take with OTC cough medications)    From your responses in the eVisit questionnaire you describe inflammation in the upper respiratory tract which is causing a significant cough.  This is commonly called Bronchitis and has four common causes:   Allergies Viral Infections Acid Reflux Bacterial Infection Allergies, viruses and acid reflux are treated by controlling symptoms or eliminating the cause. An example might be a cough caused by taking certain blood pressure medications. You stop the cough by changing the medication. Another example might be a cough caused by acid reflux. Controlling the reflux helps control the cough.  USE OF BRONCHODILATOR ("RESCUE") INHALERS: There is a risk from using your bronchodilator too frequently.  The risk is that over-reliance on a medication which only relaxes the muscles surrounding the breathing tubes can reduce the effectiveness of medications prescribed to reduce swelling and congestion of the tubes themselves.  Although you feel brief relief from the bronchodilator inhaler, your asthma may actually be worsening with the tubes becoming more swollen and filled with mucus.  This can delay other crucial treatments, such as oral steroid medications. If you need to use a bronchodilator inhaler daily, several times per day, you should discuss this with your provider.  There are  probably better treatments that could be used to keep your asthma under control.     HOME CARE Only take medications as instructed by your medical team. Complete the entire course of an antibiotic. Drink plenty of fluids and get plenty of rest. Avoid close contacts especially the very young and the elderly Cover your mouth if you cough or cough into your sleeve. Always remember to wash your hands A steam or ultrasonic humidifier can help congestion.   GET HELP RIGHT AWAY IF: You develop worsening fever. You become short of breath You cough up blood. Your symptoms persist after you have completed your treatment plan MAKE SURE YOU  Understand these instructions. Will watch your condition. Will get help right away if you are not doing well or get worse.    Thank you for choosing an e-visit.  Your e-visit answers were reviewed by a board certified advanced clinical practitioner to complete your personal care plan. Depending upon the condition, your plan could have included both over the counter or prescription medications.  Please review your pharmacy choice. Make sure the pharmacy is open so you can pick up prescription now. If there is a problem, you may contact your provider through [employer] and have the prescription routed to another pharmacy.  Your safety is important to [place]. If you have drug allergies check your prescription carefully.   For the next 24 hours you can use MyChart to ask questions about today's visit, request a non-urgent call back, or ask for a work or school excuse. You will get an email in the next two days asking about your experience. I hope  that your e-visit has been valuable and will speed your recovery.

## 2022-02-10 ENCOUNTER — Other Ambulatory Visit (HOSPITAL_COMMUNITY): Payer: Self-pay

## 2022-02-10 ENCOUNTER — Other Ambulatory Visit (HOSPITAL_BASED_OUTPATIENT_CLINIC_OR_DEPARTMENT_OTHER): Payer: Self-pay

## 2022-02-10 MED ORDER — TRETINOIN 0.025 % EX CREA
TOPICAL_CREAM | CUTANEOUS | 3 refills | Status: DC
  Filled 2022-02-10: qty 45, 30d supply, fill #0

## 2022-02-11 ENCOUNTER — Other Ambulatory Visit (HOSPITAL_COMMUNITY): Payer: Self-pay

## 2022-02-16 ENCOUNTER — Other Ambulatory Visit (HOSPITAL_BASED_OUTPATIENT_CLINIC_OR_DEPARTMENT_OTHER): Payer: Self-pay

## 2022-02-26 ENCOUNTER — Other Ambulatory Visit (HOSPITAL_COMMUNITY): Payer: Self-pay

## 2022-02-26 MED ORDER — PENICILLIN V POTASSIUM 500 MG PO TABS
ORAL_TABLET | ORAL | 0 refills | Status: DC
  Filled 2022-02-26: qty 60, 30d supply, fill #0

## 2022-03-16 ENCOUNTER — Other Ambulatory Visit (HOSPITAL_BASED_OUTPATIENT_CLINIC_OR_DEPARTMENT_OTHER): Payer: Self-pay

## 2022-03-21 ENCOUNTER — Telehealth: Payer: No Typology Code available for payment source | Admitting: Family

## 2022-03-21 DIAGNOSIS — T23239A Burn of second degree of unspecified multiple fingers (nail), not including thumb, initial encounter: Secondary | ICD-10-CM

## 2022-03-21 MED ORDER — MUPIROCIN 2 % EX OINT
1.0000 | TOPICAL_OINTMENT | Freq: Two times a day (BID) | CUTANEOUS | 0 refills | Status: DC
  Filled 2022-03-21: qty 22, 7d supply, fill #0

## 2022-03-21 MED ORDER — SILVER SULFADIAZINE 1 % EX CREA
1.0000 | TOPICAL_CREAM | Freq: Every day | CUTANEOUS | 0 refills | Status: DC
  Filled 2022-03-21: qty 50, 7d supply, fill #0
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
## 2022-03-22 ENCOUNTER — Other Ambulatory Visit (HOSPITAL_BASED_OUTPATIENT_CLINIC_OR_DEPARTMENT_OTHER): Payer: Self-pay

## 2022-03-26 ENCOUNTER — Other Ambulatory Visit: Payer: Self-pay | Admitting: Family

## 2022-03-26 DIAGNOSIS — F900 Attention-deficit hyperactivity disorder, predominantly inattentive type: Secondary | ICD-10-CM

## 2022-03-29 ENCOUNTER — Other Ambulatory Visit (HOSPITAL_COMMUNITY): Payer: Self-pay

## 2022-04-21 ENCOUNTER — Ambulatory Visit
Admission: EM | Admit: 2022-04-21 | Discharge: 2022-04-21 | Disposition: A | Discharge status: Home / Self Care | Payer: No Typology Code available for payment source | Attending: Urgent Care | Admitting: Urgent Care

## 2022-04-21 ENCOUNTER — Ambulatory Visit (INDEPENDENT_AMBULATORY_CARE_PROVIDER_SITE_OTHER): Payer: No Typology Code available for payment source

## 2022-04-21 DIAGNOSIS — R1031 Right lower quadrant pain: Secondary | ICD-10-CM

## 2022-04-21 DIAGNOSIS — K529 Noninfective gastroenteritis and colitis, unspecified: Secondary | ICD-10-CM

## 2022-04-21 LAB — POCT URINALYSIS DIP (MANUAL ENTRY)
Bilirubin, UA: NEGATIVE
Blood, UA: NEGATIVE
Glucose, UA: NEGATIVE mg/dL
Ketones, POC UA: NEGATIVE mg/dL
Leukocytes, UA: NEGATIVE
Nitrite, UA: NEGATIVE
Protein Ur, POC: NEGATIVE mg/dL
Spec Grav, UA: 1.02 (ref 1.010–1.025)
Urobilinogen, UA: 0.2 E.U./dL
pH, UA: 5.5 (ref 5.0–8.0)

## 2022-04-21 LAB — POCT URINE PREGNANCY: Preg Test, Ur: NEGATIVE

## 2022-04-21 MED ORDER — IOHEXOL 300 MG/ML  SOLN
100.0000 mL | Freq: Once | INTRAMUSCULAR | Status: AC | PRN
  Administered 2022-04-21: 100 mL via INTRAVENOUS

## 2022-04-21 MED ORDER — ONDANSETRON 4 MG PO TBDP: 4.0000 mg | ORAL_TABLET | Freq: Three times a day (TID) | ORAL | 0 refills | Status: DC | PRN

## 2022-04-21 NOTE — ED Triage Notes (Addendum)
Pt c/o intermittent RT flank pain that starts last night. States she has been having RLQ for a couple weeks now. Denies urinary sxs. Pain 5/10 Taking motrin prn. Last dose 4 hours ago.

## 2022-04-21 NOTE — Discharge Instructions (Signed)
Your CT scan does not show signs of appendicitis. Small amount of colonic thickening suggestive of enteritis, which is a viral inflammation of the bowels. Start taking ondansetron as needed for nausea and to prevent vomiting. Eat a brat diet (bananas, rice, apples, toast, saltine crackers, boiled chicken) -bland foods to help your GI tract. Stay hydrated with water, Gatorade, Pedialyte as needed.  Avoid antidiarrheals.

## 2022-04-21 NOTE — ED Provider Notes (Signed)
[facility]    CSN: [id number] Arrival date & time: 04/21/22  1332      History   Chief Complaint Chief Complaint  Patient presents with   Flank Pain    RT    HPI Alison Nichols is a 36 y.o. female.   Pleasant 36 year old female presents today with primary concern of right lower quadrant abdominal/pelvic pain and right-sided lower back pain.  She states over the past several weeks she been having an intermittent not to severe right lower quadrant pain that would come and go.  She states that eventually seem to go away, so never sought medical attention.  She states last night however she started having a sharp pain in her lower back that would radiate to her right pelvic region.  She states the pelvic pain seems more crampy in nature.  Both are intermittent, lasting usually 10 to 15 minutes, followed by 10 to 15 minutes of no pain.  It occurs spontaneously and is not associated with anything she is doing.  Pain resolves spontaneously as well, no treatments tried.  She denies a fever.  States she has been queasy, but no vomiting.  States today she did have 1 loose stool.  Denies any exposure to STI, no urinary symptoms, no history of kidney stones, no vaginal discharge.  Had a little bit of upper epigastric pain yesterday, took a Tums and states that helped.   Flank Pain Associated symptoms include abdominal pain.    Past Medical History:  Diagnosis Date   Acute upper respiratory infection 11/04/2010   Formatting of this note might be different from the original. Upper Respiratory Infection  10/1 IMO update   Adjustment disorder with anxiety 11/06/2010   Formatting of this note might be different from the original. 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 IMO Problem List Replacer Jan. 2016 Formatting of this note might be different from the original. Formatting of this note might be different from the original. IMO Problem List Replacer Jan. 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   Generalized anxiety disorder 11/25/2021   Chronic pruritus 11/25/2021   Dysautonomia (HCC) 07/15/2020   Migraine with aura and without status migrainosus, not intractable 12/31/2019   Vitamin D  deficiency 07/30/2019   ADHD, predominantly inattentive type 04/07/2018   Night vision loss 09/22/2015   Lumbar degenerative disc disease 06/20/2014   Lumbosacral radiculitis 06/20/2014   Sacroiliitis (HCC) 06/20/2014   Allergic rhinitis 06/16/2010   Irritable bowel syndrome 03/08/2008   Ehlers-Danlos syndrome, unspecified 03/08/2008   Mild asthma without complication 01/27/2008   Chronic migraine without aura without status migrainosus, not intractable 01/27/2008    Past  Surgical History:  Procedure Laterality Date   CHOLECYSTECTOMY  2002   COLPOSCOPY  2004   COMBINED HYSTEROSCOPY DIAGNOSTIC / D&C  2004   DILITATION & CURRETTAGE/HYSTROSCOPY WITH HYDROTHERMAL ABLATION N/A 05/29/2020   Procedure: DILATATION & CURETTAGE/HYSTEROSCOPY WITH NOVASURE ABLATION;  Surgeon: Mitchel Honour, DO;  Location: [facility];  Service: Gynecology;  Laterality: N/A;   HYSTEROSCOPY WITH D & C  2004   KNEE ARTHROSCOPY Right 1998 & 1999   LAPAROSCOPIC TUBAL LIGATION Bilateral 05/29/2020   Procedure: BILATERAL LAPAROSCOPIC TUBAL LIGATION WITH FILSHIE CLIPS;  Surgeon: Mitchel Honour, DO;  Location: [facility];  Service: Gynecology;  Laterality: Bilateral;   sinus sugrery  1998   TONSILLECTOMY  as child   w/ Adenoidectomy    OB History     Gravida  5   Para  3   Term  1   Preterm  2   AB  2   Living  3      SAB  2   IAB      Ectopic      Multiple  0   Live Births  3            Home Medications    Prior to Admission medications   Medication Sig Start Date End Date Taking? Authorizing Provider  ondansetron (ZOFRAN-ODT) 4 MG disintegrating tablet Take 1 tablet (4 mg total) by mouth every 8 (eight) hours as needed for nausea or vomiting. 04/21/22  Yes Shristi Scheib L, PA  albuterol (VENTOLIN HFA) 108 (90 Base) MCG/ACT inhaler INHALE 2 PUFFS BY MOUTH EVERY 6 HOURS AS NEEDED FOR WHEEZING OR SHORTNESS OF BREATH 09/11/20 09/11/21  Olive Bass, FNP  benzonatate (TESSALON) 100 MG capsule Take 1 capsule (100 mg total) by mouth 3 (three) times daily as needed for cough. 01/26/22   Waldon Merl, PA-C  hydrOXYzine (ATARAX) 25 MG tablet Take 1 tablet by mouth daily. 11/25/21   [name], NP  lisdexamfetamine (VYVANSE) 40 MG capsule Take 1 capsule by mouth once daily before breakfast. 12/25/21 03/24/22  [name], NP  lisdexamfetamine (VYVANSE) 40 MG capsule Take 1 capsule by mouth daily before breakfast. (01/23/22)  01/24/22 02/23/22  [name], NP  mupirocin ointment (BACTROBAN) 2 % Apply 1 Application topically 2 (two) times daily. 03/21/22   Junie Spencer, FNP  Rimegepant Sulfate (NURTEC) 75 MG TBDP Take 1 tablet (75 mg) by mouth daily as needed for migraines. Take as close to onset of migraine as possible. Max daily dose: 1 tablet 01/04/22   Anson Fret, MD  sertraline (ZOLOFT) 100 MG tablet Take 1 tablet by mouth at bedtime. 11/25/21   [name], NP  silver sulfADIAZINE (SILVADENE) 1 % cream Apply 1 Application topically daily. 03/21/22   Jannifer Rodney A, FNP  SUMAtriptan (IMITREX) 100 MG tablet TAKE 1 TABLET BY MOUTH AT ONSET OF HEADACHE. MAY REPEAT IN 2 HOURS IF HEADACHE PERSISTS OR RECURS. NO MORE THAN 2 TABLET IN 24 HOURS 01/04/22   Anson Fret, MD  topiramate (TOPAMAX) 50 MG tablet TAKE  1 TABLET(50 MG) BY MOUTH AT BEDTIME 01/21/22   Anson FretAhern, Antonia B, MD  tretinoin (RETIN-A) 0.025 % cream Apply to the face in the evening 02/10/22     triamcinolone cream (KENALOG) 0.1 % Apply to the affected area(s) 2 times weekly as needed 11/02/21       Family History Family History  Problem Relation Age of Onset   Heart failure Maternal Grandmother    Hypertension Maternal Grandmother    Thyroid disease Maternal Grandmother    Arthritis Maternal Grandmother    Heart attack Maternal Grandmother    CAD Maternal Grandmother    Migraines Maternal Grandmother    Depression Mother    Hyperlipidemia Mother    Hypertension Mother    Migraines Mother    Hypertension Maternal Grandfather    Kidney disease Maternal Grandfather    Epilepsy Maternal Grandfather     Social History Social History   Tobacco Use   Smoking status: Never   Smokeless tobacco: Never  Vaping Use   Vaping Use: Never used  Substance Use Topics   Alcohol use: Never   Drug use: Never     Allergies   Cephalexin   Review of Systems Review of Systems  Constitutional:  Negative for fever.  Gastrointestinal:   Positive for abdominal pain, diarrhea and nausea.  Genitourinary:  Positive for pelvic pain. Negative for flank pain.  All other systems reviewed and are negative.    Physical Exam Triage Vital Signs ED Triage Vitals  Enc Vitals Group     BP 04/21/22 1347 131/88     Pulse Rate 04/21/22 1347 70     Resp 04/21/22 1347 18     Temp 04/21/22 1347 (!) 97.3 F (36.3 C)     Temp Source 04/21/22 1347 Oral     SpO2 04/21/22 1347 100 %     Weight --      Height --      Head Circumference --      Peak Flow --      Pain Score 04/21/22 1345 5     Pain Loc --      Pain Edu? --      Excl. in GC? --    No data found.  Updated Vital Signs BP 131/88 (BP Location: Right Arm)   Pulse 70   Temp (!) 97.3 F (36.3 C) (Oral)   Resp 18   SpO2 100%   Visual Acuity Right Eye Distance:   Left Eye Distance:   Bilateral Distance:    Right Eye Near:   Left Eye Near:    Bilateral Near:     Physical Exam Vitals and nursing note reviewed.  Constitutional:      General: She is not in acute distress.    Appearance: She is well-developed.  HENT:     Head: Normocephalic and atraumatic.     Right Ear: External ear normal.     Left Ear: External ear normal.     Nose: Nose normal. No congestion or rhinorrhea.     Mouth/Throat:     Mouth: Mucous membranes are moist.     Pharynx: No oropharyngeal exudate or posterior oropharyngeal erythema.  Eyes:     General: No scleral icterus.       Right eye: No discharge.        Left eye: No discharge.     Extraocular Movements: Extraocular movements intact.     Conjunctiva/sclera: Conjunctivae normal.     Pupils: Pupils are equal, round, and  reactive to light.  Cardiovascular:     Rate and Rhythm: Normal rate and regular rhythm.     Heart sounds: No murmur heard. Pulmonary:     Effort: Pulmonary effort is normal. No respiratory distress.     Breath sounds: Normal breath sounds. No stridor. No wheezing, rhonchi or rales.  Abdominal:     General:  Abdomen is flat. Bowel sounds are normal. There is no distension.     Palpations: Abdomen is soft. There is no mass.     Tenderness: There is abdominal tenderness (RLQ with positive rovsing and obterator sign.). There is rebound. There is no right CVA tenderness, left CVA tenderness or guarding.     Hernia: No hernia is present.     Comments: Negative psoas sign McBurney point positive.  Musculoskeletal:        General: No swelling.     Cervical back: Normal range of motion and neck supple. No rigidity or tenderness.  Lymphadenopathy:     Cervical: No cervical adenopathy.  Skin:    General: Skin is warm and dry.     Capillary Refill: Capillary refill takes less than 2 seconds.     Coloration: Skin is not jaundiced.     Findings: No erythema or rash.  Neurological:     General: No focal deficit present.     Mental Status: She is alert and oriented to person, place, and time.  Psychiatric:        Mood and Affect: Mood normal.      UC Treatments / Results  Labs (all labs ordered are listed, but only abnormal results are displayed) Labs Reviewed  POCT URINALYSIS DIP (MANUAL ENTRY)  POCT URINE PREGNANCY    EKG   Radiology CT ABDOMEN PELVIS W CONTRAST  Result Date: 04/21/2022 CLINICAL DATA:  Right lower quadrant pain concerning for appendicitis. EXAM: CT ABDOMEN AND PELVIS WITH CONTRAST TECHNIQUE: Multidetector CT imaging of the abdomen and pelvis was performed using the standard protocol following bolus administration of intravenous contrast. RADIATION DOSE REDUCTION: This exam was performed according to the departmental dose-optimization program which includes automated exposure control, adjustment of the mA and/or kV according to patient size and/or use of iterative reconstruction technique. CONTRAST:  OMNIPAQUE IOHEXOL 300 MG/ML  SOLN COMPARISON:  None available FINDINGS: Lower chest: No acute abnormality. Hepatobiliary: No focal liver abnormality is seen. Status post  cholecystectomy. No biliary dilatation. Pancreas: Unremarkable. No pancreatic ductal dilatation or surrounding inflammatory changes. Spleen: Normal in size without focal abnormality. Adrenals/Urinary Tract: Adrenal glands are unremarkable. Kidneys are normal, without renal calculi, focal lesion, or hydronephrosis. Bladder is unremarkable. Stomach/Bowel: Fat density material within the lumen of the duodenal bulb most likely ingested material. No bowel dilatation to indicate ileus or obstruction. The cecum is located in the left upper quadrant and fluid filled. This is suggestive of diarrheal illness. The appendix is normal. There is mild diffuse small bowel wall thickening suggestive of mild enteritis. Vascular/Lymphatic: No significant vascular findings are present. No enlarged abdominal or pelvic lymph nodes. Reproductive: Uterus and bilateral adnexa are unremarkable. Bilateral adnexal clips are present. Other: No abdominal wall hernia or abnormality. No abdominopelvic ascites. Musculoskeletal: No acute or significant osseous findings. IMPRESSION: 1. Mild diffuse small bowel wall thickening most likely due to enteritis. No evidence of bowel obstruction. 2. Normal appendix. Cecum and appendix located in the left upper quadrant. Electronically Signed   By: Acquanetta Belling M.D.   On: 04/21/2022 15:36    Procedures Procedures (including critical  care time)  Medications Ordered in UC Medications - No data to display  Initial Impression / Assessment and Plan / UC Course  I have reviewed the triage vital signs and the nursing notes.  Pertinent labs & imaging results that were available during my care of the patient were reviewed by me and considered in my medical decision making (see chart for details).     Enteritis - likely VGE. No appendicitis noted on CT, no other findings to indicate lower back discomfort. UA negative. Supportive measures recommended including BRAT diet, Zofran as needed, maintaining  hydration.  Patient encouraged to return to clinic or head to ER if fever and profuse to vomiting develops.  Final Clinical Impressions(s) / UC Diagnoses   Final diagnoses:  Enteritis     Discharge Instructions      Your CT scan does not show signs of appendicitis. Small amount of colonic thickening suggestive of enteritis, which is a viral inflammation of the bowels. Start taking ondansetron as needed for nausea and to prevent vomiting. Eat a brat diet (bananas, rice, apples, toast, saltine crackers, boiled chicken) -bland foods to help your GI tract. Stay hydrated with water, Gatorade, Pedialyte as needed.  Avoid antidiarrheals.     ED Prescriptions     Medication Sig Dispense Auth. Provider   ondansetron (ZOFRAN-ODT) 4 MG disintegrating tablet Take 1 tablet (4 mg total) by mouth every 8 (eight) hours as needed for nausea or vomiting. 20 tablet Aara Jacquot L, [place]      PDMP not reviewed this encounter.   Maretta Bees, [place] 04/21/22 1610

## 2022-05-20 ENCOUNTER — Encounter: Payer: Self-pay | Admitting: Neurology

## 2022-05-20 MED ORDER — CYCLOBENZAPRINE HCL 10 MG PO TABS: ORAL_TABLET | Freq: Every day | ORAL | 0 refills | Status: AC

## 2022-05-20 MED ORDER — METHOCARBAMOL 500 MG PO TABS: 500.0000 mg | ORAL_TABLET | Freq: Three times a day (TID) | ORAL | 1 refills | Status: DC | PRN

## 2022-05-20 NOTE — Telephone Encounter (Signed)
Spoke with Dr Jaynee Eagles. Ok to refill both but only flexeril at night and Robaxin during the day. Refills sent to [facility] [address].

## 2022-05-21 ENCOUNTER — Ambulatory Visit: Payer: BC Managed Care – PPO | Admitting: Family

## 2022-05-21 ENCOUNTER — Telehealth: Payer: Self-pay | Admitting: Family

## 2022-05-21 ENCOUNTER — Encounter: Payer: Self-pay | Admitting: Family

## 2022-05-21 DIAGNOSIS — F411 Generalized anxiety disorder: Secondary | ICD-10-CM

## 2022-05-21 DIAGNOSIS — F900 Attention-deficit hyperactivity disorder, predominantly inattentive type: Secondary | ICD-10-CM

## 2022-05-21 MED ORDER — LISDEXAMFETAMINE DIMESYLATE 50 MG PO CAPS: 50.0000 mg | ORAL_CAPSULE | Freq: Every day | ORAL | 0 refills | Status: DC

## 2022-05-21 MED ORDER — SERTRALINE HCL 100 MG PO TABS: 100.0000 mg | ORAL_TABLET | Freq: Every day | ORAL | 1 refills | Status: DC

## 2022-05-21 NOTE — Assessment & Plan Note (Signed)
   chronic  feels 40mg  Vyvanse is not working as well, no increased HR, but feels no improvement in sx either  increasing dose to 50mg , advised she let me know if working next month and will send next 2 refills  f/u in 3 mos or prn virtual or in-office

## 2022-05-21 NOTE — Progress Notes (Signed)
Patient ID: Alison Nichols, female    DOB: January 30, 1986, 36 y.o.   MRN: [id number]  Chief Complaint  Patient presents with   ADHD    Medication refill    HPI: Anxiety/Depression: Patient complains of anxiety disorder and hx of depression.   She has the following symptoms: none.  Onset of symptoms was approximately  years ago, She denies current suicidal and homicidal ideation.  Possible organic causes contributing are: none.  Risk factors: previous episode of depression  Previous treatment includes Zoloft and Vistaril.  She complains of the following side effects from the treatment: none. ADHD f/u: Medications helping target goals: Vyvanse - over a year Regimen: daily, work days mostly Medication side effects/concerns: none Weight: no change Sleep: denies issues Mood changes: denies Tics: denies Blood pressure, Weight, Pulse, Behavior reviewed: wnl   Assessment & Plan:   Problem List Items Addressed This Visit       Other   ADHD, predominantly inattentive type    chronic feels 40mg  Vyvanse is not working as well, no increased HR, but feels no improvement in sx either increasing dose to 50mg , advised she let me know if working next month and will send next 2 refills f/u in 3 mos or prn virtual or in-office      Relevant Medications   lisdexamfetamine (VYVANSE) 50 MG capsule   Generalized anxiety disorder    chronic stable on Zoloft 100mg  qd sending refill f/u in 6 mos      Relevant Medications   sertraline (ZOLOFT) 100 MG tablet    Subjective:    Outpatient Medications Prior to Visit  Medication Sig Dispense Refill   cyclobenzaprine (FLEXERIL) 10 MG tablet TAKE 1 TABLET BY MOUTH AT BEDTIME 90 tablet 0   hydrOXYzine (ATARAX) 25 MG tablet Take 1 tablet by mouth daily. 90 tablet 1   methocarbamol (ROBAXIN) 500 MG tablet Take 1 tablet (500 mg total) by mouth every 8 (eight) hours as needed for muscle spasms. 90 tablet 1   ondansetron (ZOFRAN-ODT) 4 MG  disintegrating tablet Take 1 tablet (4 mg total) by mouth every 8 (eight) hours as needed for nausea or vomiting. 20 tablet 0   Rimegepant Sulfate (NURTEC) 75 MG TBDP Take 1 tablet (75 mg) by mouth daily as needed for migraines. Take as close to onset of migraine as possible. Max daily dose: 1 tablet 16 tablet 12   SUMAtriptan (IMITREX) 100 MG tablet TAKE 1 TABLET BY MOUTH AT ONSET OF HEADACHE. MAY REPEAT IN 2 HOURS IF HEADACHE PERSISTS OR RECURS. NO MORE THAN 2 TABLET IN 24 HOURS 9 tablet 11   topiramate (TOPAMAX) 50 MG tablet TAKE 1 TABLET(50 MG) BY MOUTH AT BEDTIME 90 tablet 3   tretinoin (RETIN-A) 0.025 % cream Apply to the face in the evening 45 g 3   triamcinolone cream (KENALOG) 0.1 % Apply to the affected area(s) 2 times weekly as needed 90 g 2   sertraline (ZOLOFT) 100 MG tablet Take 1 tablet by mouth at bedtime. 90 tablet 1   albuterol (VENTOLIN HFA) 108 (90 Base) MCG/ACT inhaler INHALE 2 PUFFS BY MOUTH EVERY 6 HOURS AS NEEDED FOR WHEEZING OR SHORTNESS OF BREATH 18 g 0   benzonatate (TESSALON) 100 MG capsule Take 1 capsule (100 mg total) by mouth 3 (three) times daily as needed for cough. 30 capsule 0   lisdexamfetamine (VYVANSE) 40 MG capsule Take 1 capsule by mouth once daily before breakfast. 30 capsule 0   lisdexamfetamine (VYVANSE) 40 MG capsule  Take 1 capsule by mouth daily before breakfast. (01/23/22) 30 capsule 0   mupirocin ointment (BACTROBAN) 2 % Apply 1 Application topically 2 (two) times daily. 22 g 0   silver sulfADIAZINE (SILVADENE) 1 % cream Apply 1 Application topically daily. 50 g 0   No facility-administered medications prior to visit.   Past Medical History:  Diagnosis Date   Acute upper respiratory infection 11/04/2010   Formatting of this note might be different from the original. Upper Respiratory Infection  10/1 IMO update   Adjustment disorder with anxiety 11/06/2010   Formatting of this note might be different from the original. 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   Bilateral impacted cerumen 10/04/2017   Bullous myringitis of left ear 10/04/2017   Complication of anesthesia    COVID-19 01/2019   scrachy throat x few days all symptoms resolved   Depression    Ehlers-Danlos disease    Fatigue 06/20/2014   Gastroenteritis, non-infectious 06/20/2014   Glycosuria 06/20/2014   Headache    History of gestational hypertension 2019   History of TB skin testing 09/22/2015   History of varicella infection 06/20/2014   IBS (irritable bowel syndrome)    Lower urinary tract infectious disease 06/20/2014   Formatting of this note might be different from the original. IMO Problem List Replacer Jan. 2016 Formatting of this note might be different from the original. Formatting of this note might be different from the original. IMO Problem List Replacer Jan. 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 Adjustment Disorder With Anxiety And Prolonged Depressed Mood   Scoliosis    Vaginal Pap smear, abnormal    Wears glasses 09/22/2015   Past Surgical History:  Procedure Laterality Date   CHOLECYSTECTOMY  2002   COLPOSCOPY  2004   COMBINED HYSTEROSCOPY DIAGNOSTIC / D&C  2004   DILITATION & CURRETTAGE/HYSTROSCOPY WITH HYDROTHERMAL ABLATION N/A 05/29/2020   Procedure: DILATATION & CURETTAGE/HYSTEROSCOPY WITH NOVASURE ABLATION;  Surgeon: Mitchel Honour, DO;  Location: [facility];  Service: Gynecology;  Laterality: N/A;   HYSTEROSCOPY WITH D & C  2004   KNEE ARTHROSCOPY Right 1998 & 1999   LAPAROSCOPIC TUBAL LIGATION Bilateral 05/29/2020   Procedure: BILATERAL LAPAROSCOPIC TUBAL LIGATION WITH FILSHIE CLIPS;  Surgeon: Mitchel Honour, DO;  Location:  [facility];  Service: Gynecology;  Laterality: Bilateral;   sinus sugrery  1998   TONSILLECTOMY  as child   w/ Adenoidectomy   Allergies  Allergen Reactions   Cephalexin Hives    Keflex is hives   Amoxicillin-Pot Clavulanate Rash      Objective:    Physical Exam Vitals and nursing note reviewed.  Constitutional:      Appearance: Normal appearance.  Cardiovascular:     Rate and Rhythm: Normal rate and regular rhythm.  Pulmonary:     Effort: Pulmonary effort is normal.     Breath sounds: Normal breath sounds.  Musculoskeletal:        General: Normal range of motion.  Skin:    General: Skin is warm and dry.  Neurological:     Mental Status: She is alert.  Psychiatric:        Mood and Affect:  Mood normal.        Behavior: Behavior normal.    BP 118/80 (BP Location: Left Arm, Patient Position: Sitting, Cuff Size: Large)   Pulse 75   Temp 97.7 F (36.5 C) (Temporal)   Ht 5\' 7"  (1.702 m)   Wt 152 lb 9.6 oz (69.2 kg)   SpO2 100%   BMI 23.90 kg/m  Wt Readings from Last 3 Encounters:  05/21/22 152 lb 9.6 oz (69.2 kg)  01/04/22 144 lb 3.2 oz (65.4 kg)  11/25/21 144 lb 4 oz (65.4 kg)       01/25/22, NP

## 2022-05-21 NOTE — Telephone Encounter (Signed)
Caller states: -returning call to answer questions about CT abdomen, etc.   Caller Request -call back

## 2022-05-21 NOTE — Assessment & Plan Note (Signed)
   chronic  stable on Zoloft 100mg  qd  sending refill  f/u in 6 mos

## 2022-05-26 ENCOUNTER — Encounter: Payer: Self-pay | Admitting: Family

## 2022-06-18 ENCOUNTER — Ambulatory Visit (INDEPENDENT_AMBULATORY_CARE_PROVIDER_SITE_OTHER): Payer: BC Managed Care – PPO

## 2022-06-18 ENCOUNTER — Encounter: Payer: Self-pay | Admitting: Orthopaedic Surgery

## 2022-06-18 ENCOUNTER — Ambulatory Visit: Payer: BC Managed Care – PPO | Admitting: Orthopaedic Surgery

## 2022-06-18 DIAGNOSIS — M25551 Pain in right hip: Secondary | ICD-10-CM

## 2022-06-18 MED ORDER — METHYLPREDNISOLONE ACETATE 40 MG/ML IJ SUSP
40.0000 mg | INTRAMUSCULAR | Status: AC | PRN
  Administered 2022-06-18: 40 mg via INTRA_ARTICULAR

## 2022-06-18 MED ORDER — LIDOCAINE HCL 1 % IJ SOLN
3.0000 mL | INTRAMUSCULAR | Status: AC | PRN
  Administered 2022-06-18: 3 mL

## 2022-06-18 MED ORDER — BUPIVACAINE HCL 0.5 % IJ SOLN
3.0000 mL | INTRAMUSCULAR | Status: AC | PRN
  Administered 2022-06-18: 3 mL via INTRA_ARTICULAR

## 2022-06-18 NOTE — Progress Notes (Signed)
Office Visit Note   Patient: Alison Nichols           Date of Birth: 12-Jun-1986           MRN: [id number] Visit Date: 06/18/2022              Requested by: [name], NP [address] [place],  [place] [address] PCP: [name], NP   Assessment & Plan: Visit Diagnoses:  1. Pain in right hip     Plan: Impression is right hip trochanteric pain.  Disease processes and treatment options were explained and she elected for cortisone injection today.  Home exercises were also provided.  Follow-up as needed.  Follow-Up Instructions: No follow-ups on file.   Orders:  Orders Placed This Encounter  Procedures   XR Lumbar Spine 2-3 Views   XR HIP UNILAT W OR W/O PELVIS 2-3 VIEWS RIGHT   No orders of the defined types were placed in this encounter.     Procedures: Large Joint Inj: R greater trochanter on 06/18/2022 4:16 PM Indications: pain Details: 22 G needle  Arthrogram: No  Medications: 3 mL lidocaine 1 %; 3 mL bupivacaine 0.5 %; 40 mg methylPREDNISolone acetate 40 MG/ML Patient was prepped and draped in the usual sterile fashion.       Clinical Data: No additional findings.   Subjective: Chief Complaint  Patient presents with   Right Hip - Pain    HPI Alison Nichols is a 36 year old female comes in for 6 weeks of lateral right hip pain.  Denies any injuries.  Feels tenderness to the lateral side with that wakes her up at night.  No real radicular symptoms.  Has pain when sleeping on the right side.  Ibuprofen helps a little bit.  Denies any groin pain.  Review of Systems  Constitutional: Negative.   HENT: Negative.    Eyes: Negative.   Respiratory: Negative.    Cardiovascular: Negative.   Endocrine: Negative.   Musculoskeletal: Negative.   Neurological: Negative.   Hematological: Negative.   Psychiatric/Behavioral: Negative.    All other systems reviewed and are negative.    Objective: Vital Signs: There were no vitals taken for  this visit.  Physical Exam Vitals and nursing note reviewed.  Constitutional:      Appearance: She is well-developed.  HENT:     Head: Atraumatic.     Nose: Nose normal.  Eyes:     Extraocular Movements: Extraocular movements intact.  Cardiovascular:     Pulses: Normal pulses.  Pulmonary:     Effort: Pulmonary effort is normal.  Abdominal:     Palpations: Abdomen is soft.  Musculoskeletal:     Cervical back: Neck supple.  Skin:    General: Skin is warm.     Capillary Refill: Capillary refill takes less than 2 seconds.  Neurological:     Mental Status: She is alert. Mental status is at baseline.  Psychiatric:        Behavior: Behavior normal.        Thought Content: Thought content normal.        Judgment: Judgment normal.     Ortho Exam Examination of the right hip shows fluid painless range of motion.  There is no groin pain.  Trochanteric tenderness is present.  Pain with Ober test.  Skin is normal.  No sciatic tension signs. Specialty Comments:  No specialty comments available.  Imaging: XR Lumbar Spine 2-3 Views  Result Date: 06/18/2022 AP and lateral x-rays demonstrate stable  thoracolumbar scoliosis  XR HIP UNILAT W OR W/O PELVIS 2-3 VIEWS RIGHT  Result Date: 06/18/2022 X-rays demonstrate no acute or structural abnormalities.      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   Generalized anxiety disorder 11/25/2021   Dysautonomia ([place]) 07/15/2020   Vitamin D deficiency 07/30/2019   ADHD, predominantly inattentive type 04/07/2018   Lumbar degenerative disc disease 06/20/2014   Allergic rhinitis 06/16/2010   Irritable bowel syndrome 03/08/2008   Ehlers-Danlos syndrome, unspecified 03/08/2008   Mild asthma without complication 65/10/5463   Chronic migraine without aura without status migrainosus, not intractable  01/27/2008   Past Medical History:  Diagnosis Date   Acute upper respiratory infection 11/04/2010   Formatting of this note might be different from the original. Upper Respiratory Infection  10/1 IMO update   Adjustment disorder with anxiety 11/06/2010   Formatting of this note might be different from the original. 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   Bilateral impacted cerumen 10/04/2017   Bullous myringitis of left ear 6/81/2751   Complication of anesthesia    COVID-19 01/2019   scrachy throat x few days all symptoms resolved   Depression    Ehlers-Danlos disease    Fatigue 06/20/2014   Gastroenteritis, non-infectious 06/20/2014   Glycosuria 06/20/2014   Headache    History of gestational hypertension 2019   History of TB skin testing 09/22/2015   History of varicella infection 06/20/2014   IBS (irritable bowel syndrome)    Lower urinary tract infectious disease 06/20/2014   Formatting of this note might be different from the original. IMO Problem List Replacer Jan. 2016 Formatting of this note might be different from the original. Formatting of this note might be different from the original. IMO Problem List Replacer Jan. 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 Adjustment Disorder With Anxiety And Prolonged Depressed Mood   Scoliosis    Vaginal Pap smear, abnormal    Wears glasses 09/22/2015    Family History  Problem Relation Age of Onset   Heart failure Maternal Grandmother    Hypertension Maternal Grandmother    Thyroid disease Maternal Grandmother    Arthritis Maternal Grandmother    Heart attack Maternal Grandmother    CAD Maternal Grandmother    Migraines  Maternal Grandmother    Depression Mother    Hyperlipidemia Mother    Hypertension Mother    Migraines Mother    Hypertension Maternal Grandfather    Kidney disease Maternal Grandfather    Epilepsy Maternal Grandfather     Past Surgical History:  Procedure Laterality Date   CHOLECYSTECTOMY  2002   COLPOSCOPY  2004   COMBINED HYSTEROSCOPY DIAGNOSTIC / D&C  2004   DILITATION & CURRETTAGE/HYSTROSCOPY WITH HYDROTHERMAL ABLATION N/A 05/29/2020   Procedure: DILATATION & CURETTAGE/HYSTEROSCOPY WITH NOVASURE ABLATION;  Surgeon: Linda Hedges, DO;  Location: [facility];  Service: Gynecology;  Laterality: N/A;   HYSTEROSCOPY WITH D & C  2004   KNEE ARTHROSCOPY Right 1998 & 1999   LAPAROSCOPIC TUBAL LIGATION Bilateral 05/29/2020   Procedure: BILATERAL LAPAROSCOPIC TUBAL  LIGATION WITH FILSHIE CLIPS;  Surgeon: Mitchel Honour, DO;  Location: [facility];  Service: Gynecology;  Laterality: Bilateral;   sinus sugrery  1998   TONSILLECTOMY  as child   w/ Adenoidectomy   Social History   Occupational History   Not on file  Tobacco Use   Smoking status: Never   Smokeless tobacco: Never  Vaping Use   Vaping Use: Never used  Substance and Sexual Activity   Alcohol use: Never   Drug use: Never   Sexual activity: Yes    Birth control/protection: Surgical    Comment: tubal ligation

## 2022-06-30 ENCOUNTER — Other Ambulatory Visit (HOSPITAL_COMMUNITY): Payer: Self-pay

## 2022-06-30 ENCOUNTER — Encounter: Payer: Self-pay | Admitting: Family

## 2022-06-30 DIAGNOSIS — F900 Attention-deficit hyperactivity disorder, predominantly inattentive type: Secondary | ICD-10-CM

## 2022-06-30 MED ORDER — LISDEXAMFETAMINE DIMESYLATE 50 MG PO CAPS
50.0000 mg | ORAL_CAPSULE | Freq: Every day | ORAL | 0 refills | Status: DC
  Filled 2022-06-30 – 2022-07-20 (×2): qty 30, 30d supply, fill #0
  Filled ????-??-??: fill #0

## 2022-06-30 MED ORDER — LISDEXAMFETAMINE DIMESYLATE 50 MG PO CAPS
50.0000 mg | ORAL_CAPSULE | Freq: Every day | ORAL | 0 refills | Status: DC
  Filled 2022-11-08: qty 30, 30d supply, fill #0

## 2022-06-30 NOTE — Addendum Note (Signed)
Addended byDulce Sellar on: 06/30/2022 01:10 PM   Modules accepted: Orders

## 2022-07-03 ENCOUNTER — Other Ambulatory Visit (HOSPITAL_BASED_OUTPATIENT_CLINIC_OR_DEPARTMENT_OTHER): Payer: Self-pay

## 2022-07-13 ENCOUNTER — Encounter: Payer: Self-pay | Admitting: Neurology

## 2022-07-13 DIAGNOSIS — G43009 Migraine without aura, not intractable, without status migrainosus: Secondary | ICD-10-CM

## 2022-07-13 DIAGNOSIS — M7989 Other specified soft tissue disorders: Secondary | ICD-10-CM | POA: Diagnosis not present

## 2022-07-13 MED ORDER — TOPIRAMATE 50 MG PO TABS: ORAL_TABLET | ORAL | 3 refills | Status: DC

## 2022-07-14 DIAGNOSIS — Z01419 Encounter for gynecological examination (general) (routine) without abnormal findings: Secondary | ICD-10-CM | POA: Diagnosis not present

## 2022-07-14 DIAGNOSIS — Z6824 Body mass index (BMI) 24.0-24.9, adult: Secondary | ICD-10-CM | POA: Diagnosis not present

## 2022-07-19 ENCOUNTER — Encounter: Payer: Self-pay | Admitting: Family

## 2022-07-19 ENCOUNTER — Other Ambulatory Visit (HOSPITAL_COMMUNITY): Payer: Self-pay

## 2022-07-20 ENCOUNTER — Other Ambulatory Visit (HOSPITAL_COMMUNITY): Payer: Self-pay

## 2022-07-20 NOTE — Telephone Encounter (Signed)
I called and spoke with pharmacy, They stated they will fill 11/8 RX and pt can pick up today. I called pt and LVM in regards.

## 2022-08-03 ENCOUNTER — Encounter: Payer: Self-pay | Admitting: Orthopaedic Surgery

## 2022-08-05 ENCOUNTER — Encounter: Payer: Self-pay | Admitting: *Deleted

## 2022-08-30 ENCOUNTER — Other Ambulatory Visit: Payer: Self-pay | Admitting: Family

## 2022-08-30 DIAGNOSIS — F900 Attention-deficit hyperactivity disorder, predominantly inattentive type: Secondary | ICD-10-CM

## 2022-08-31 MED ORDER — LISDEXAMFETAMINE DIMESYLATE 50 MG PO CAPS
50.0000 mg | ORAL_CAPSULE | Freq: Every day | ORAL | 0 refills | Status: DC
  Filled 2022-08-31: qty 19, 19d supply, fill #0
  Filled 2022-09-01: qty 11, 11d supply, fill #0
  Filled 2022-09-17: qty 19, 19d supply, fill #0
  Filled 2022-09-17: qty 30, 30d supply, fill #0
  Filled 2022-09-17: qty 11, 11d supply, fill #0
  Filled 2022-09-17: qty 30, 30d supply, fill #0
  Filled ????-??-??: fill #1

## 2022-09-01 ENCOUNTER — Other Ambulatory Visit (HOSPITAL_COMMUNITY): Payer: Self-pay

## 2022-09-01 ENCOUNTER — Other Ambulatory Visit: Payer: Self-pay

## 2022-09-14 ENCOUNTER — Other Ambulatory Visit (HOSPITAL_COMMUNITY): Payer: Self-pay

## 2022-09-17 ENCOUNTER — Other Ambulatory Visit (HOSPITAL_COMMUNITY): Payer: Self-pay

## 2022-11-05 ENCOUNTER — Other Ambulatory Visit: Payer: Self-pay | Admitting: Family

## 2022-11-05 DIAGNOSIS — F900 Attention-deficit hyperactivity disorder, predominantly inattentive type: Secondary | ICD-10-CM

## 2022-11-08 ENCOUNTER — Other Ambulatory Visit: Payer: Self-pay

## 2022-11-08 ENCOUNTER — Other Ambulatory Visit (HOSPITAL_COMMUNITY): Payer: Self-pay

## 2022-11-09 ENCOUNTER — Other Ambulatory Visit (HOSPITAL_COMMUNITY): Payer: Self-pay

## 2022-11-18 ENCOUNTER — Other Ambulatory Visit (HOSPITAL_COMMUNITY): Payer: Self-pay

## 2022-11-29 ENCOUNTER — Other Ambulatory Visit: Payer: Self-pay | Admitting: *Deleted

## 2022-11-29 ENCOUNTER — Other Ambulatory Visit (HOSPITAL_COMMUNITY): Payer: Self-pay

## 2022-11-29 ENCOUNTER — Other Ambulatory Visit: Payer: Self-pay | Admitting: Family

## 2022-11-29 ENCOUNTER — Encounter: Payer: Self-pay | Admitting: Family

## 2022-11-29 DIAGNOSIS — F411 Generalized anxiety disorder: Secondary | ICD-10-CM

## 2022-11-29 MED ORDER — SERTRALINE HCL 100 MG PO TABS
100.0000 mg | ORAL_TABLET | Freq: Every day | ORAL | 0 refills | Status: DC
  Filled 2022-11-29: qty 30, 30d supply, fill #0

## 2022-12-03 ENCOUNTER — Ambulatory Visit: Payer: BC Managed Care – PPO | Admitting: Family

## 2022-12-06 ENCOUNTER — Encounter: Payer: Self-pay | Admitting: Neurology

## 2022-12-06 ENCOUNTER — Ambulatory Visit (INDEPENDENT_AMBULATORY_CARE_PROVIDER_SITE_OTHER): Payer: BC Managed Care – PPO | Admitting: Neurology

## 2022-12-06 VITALS — BP 117/75 | HR 78 | Ht 64.0 in | Wt 154.0 lb

## 2022-12-06 DIAGNOSIS — G43009 Migraine without aura, not intractable, without status migrainosus: Secondary | ICD-10-CM | POA: Diagnosis not present

## 2022-12-06 MED ORDER — QULIPTA 60 MG PO TABS: 60.0000 mg | ORAL_TABLET | Freq: Every day | ORAL | 11 refills | Status: DC

## 2022-12-06 MED ORDER — UBRELVY 100 MG PO TABS: 100.0000 mg | ORAL_TABLET | ORAL | 11 refills | Status: DC | PRN

## 2022-12-06 NOTE — Patient Instructions (Addendum)
Preventative Change from Topiramate to [place] daily. After a few weeks of qulipta can stop topiramate if feeling better.  Acute.emergent: Try Bernita Raisin (still have nurtec and imitrex) Next could try Vyepti or Ajovy  [facility] - [place], [place] - [address], [address] P: [phone] F: [phone] Address  [address]  [place] [address]   Operation    Hours: Not open 24 hours  E-Prescribing  E-Prescribing controlled substances     Atogepant Tablets What is this medication? ATOGEPANT (a TOE je pant) prevents migraines. It works by blocking a substance in the body that causes migraines. This medicine may be used for other purposes; ask your health care provider or pharmacist if you have questions. COMMON BRAND NAME(S): QULIPTA What should I tell my care team before I take this medication? They need to know if you have any of these conditions: Kidney disease Liver disease An unusual or allergic reaction to atogepant, other medications, foods, dyes, or preservatives Pregnant or trying to get pregnant Breast-feeding How should I use this medication? Take this medication by mouth with water. Take it as directed on the prescription label at the same time every day. You can take it with or without food. If it upsets your stomach, take it with food. Keep taking it unless your care team tells you to stop. Talk to your care team about the use of this medication in children. Special care may be needed. Overdosage: If you think you have taken too much of this medicine contact a poison control center or emergency room at once. NOTE: This medicine is only for you. Do not share this medicine with others. What if I miss a dose? If you miss a dose, take it as soon as you can. If it is almost time for your next dose, take only that dose. Do not take double or extra doses. What may interact with this medication? Carbamazepine Certain medications for fungal  infections, such as itraconazole, ketoconazole Clarithromycin Cyclosporine Efavirenz Etravirine Phenytoin Rifampin St. John's wort This list may not describe all possible interactions. Give your health care provider a list of all the medicines, herbs, non-prescription drugs, or dietary supplements you use. Also tell them if you smoke, drink alcohol, or use illegal drugs. Some items may interact with your medicine. What should I watch for while using this medication? Visit your care team for regular checks on your progress. Tell your care team if your symptoms do not start to get better or if they get worse. What side effects may I notice from receiving this medication? Side effects that you should report to your care team as soon as possible: Allergic reactions--skin rash, itching, hives, swelling of the face, lips, tongue, or throat Side effects that usually do not require medical attention (report to your care team if they continue or are bothersome): Constipation Fatigue Loss of appetite with weight loss Nausea This list may not describe all possible side effects. Call your doctor for medical advice about side effects. You may report side effects to FDA at 1-800-FDA-1088. Where should I keep my medication? Keep out of the reach of children and pets. Store at room temperature between 20 and 25 degrees C (68 and 77 degrees F). Get rid of any unused medication after the expiration date. To get rid of medications that are no longer needed or have expired: Take the medication to a medication take-back program. Check with your pharmacy or law enforcement to find a location. If  you cannot return the medication, check the label or package insert to see if the medication should be thrown out in the garbage or flushed down the toilet. If you are not sure, ask your care team. If it is safe to put it in the trash, take the medication out of the container. Mix the medication with cat litter, dirt,  coffee grounds, or other unwanted substance. Seal the mixture in a bag or container. Put it in the trash. NOTE: This sheet is a summary. It may not cover all possible information. If you have questions about this medicine, talk to your doctor, pharmacist, or health care provider.  2023 Elsevier/Gold Standard (2021-08-25 00:00:00) Ubrogepant Tablets What is this medication? UBROGEPANT (ue BROE je pant) treats migraines. It works by blocking a substance in the body that causes migraines. It is not used to prevent migraines. This medicine may be used for other purposes; ask your health care provider or pharmacist if you have questions. COMMON BRAND NAME(S): Bernita Raisin What should I tell my care team before I take this medication? They need to know if you have any of these conditions: Kidney disease Liver disease An unusual or allergic reaction to ubrogepant, other medications, foods, dyes, or preservatives Pregnant or trying to get pregnant Breast-feeding How should I use this medication? Take this medication by mouth with a glass of water. Take it as directed on the prescription label. You can take it with or without food. If it upsets your stomach, take it with food. Keep taking it unless your care team tells you to stop. Talk to your care team about the use of this medication in children. Special care may be needed. Overdosage: If you think you have taken too much of this medicine contact a poison control center or emergency room at once. NOTE: This medicine is only for you. Do not share this medicine with others. What if I miss a dose? This does not apply. This medication is not for regular use. What may interact with this medication? Do not take this medication with any of the following: Adagrasib Ceritinib Certain antibiotics, such as chloramphenicol, clarithromycin, telithromycin Certain antivirals for HIV, such as atazanavir, cobicistat, darunavir, delavirdine, fosamprenavir, indinavir,  ritonavir Certain medications for fungal infections, such as itraconazole, ketoconazole, posaconazole, voriconazole Conivaptan Grapefruit Idelalisib Mifepristone Nefazodone Ribociclib This medication may also interact with the following: Carvedilol Certain medications for seizures, such as phenobarbital, phenytoin Ciprofloxacin Cyclosporine Eltrombopag Fluconazole Fluvoxamine Quinidine Rifampin St. John's wort Verapamil This list may not describe all possible interactions. Give your health care provider a list of all the medicines, herbs, non-prescription drugs, or dietary supplements you use. Also tell them if you smoke, drink alcohol, or use illegal drugs. Some items may interact with your medicine. What should I watch for while using this medication? Visit your care team for regular checks on your progress. Tell your care team if your symptoms do not start to get better or if they get worse. Your mouth may get dry. Chewing sugarless gum or sucking hard candy and drinking plenty of water may help. Contact your care team if the problem does not go away or is severe. What side effects may I notice from receiving this medication? Side effects that you should report to your care team as soon as possible: Allergic reactions--skin rash, itching, hives, swelling of the face, lips, tongue, or throat Side effects that usually do not require medical attention (report to your care team if they continue or are bothersome): Drowsiness Dry  mouth Fatigue Nausea This list may not describe all possible side effects. Call your doctor for medical advice about side effects. You may report side effects to FDA at 1-800-FDA-1088. Where should I keep my medication? Keep out of the reach of children and pets. Store between 15 and 30 degrees C (59 and 86 degrees F). Get rid of any unused medication after the expiration date. To get rid of medications that are no longer needed or have expired: Take the  medication to a medication take-back program. Check with your pharmacy or law enforcement to find a location. If you cannot return the medication, check the label or package insert to see if the medication should be thrown out in the garbage or flushed down the toilet. If you are not sure, ask your care team. If it is safe to put it in the trash, pour the medication out of the container. Mix the medication with cat litter, dirt, coffee grounds, or other unwanted substance. Seal the mixture in a bag or container. Put it in the trash. NOTE: This sheet is a summary. It may not cover all possible information. If you have questions about this medicine, talk to your doctor, pharmacist, or health care provider.  2023 Elsevier/Gold Standard (2021-09-17 00:00:00)

## 2022-12-06 NOTE — Progress Notes (Signed)
[facility]    Provider:  Dr Lucia Gaskins Requesting Provider: [name], NP Primary Care Provider:  [name], NP  CC:  Migraines  12/06/2022: She is here.  6 migraines a month. Nurtec doesn't work as well, imitrex helps acutely. Weather related. < 10 total headache days a month. Currently on topiramate doesn't want to increase it having side effects. imitrex good acutely but wants to try ubrelvy see if it is better. We discussed options.  Can also consider Ajovy, Vyepti if she worsens. We discussed qulipta and ubrelvy at this time, if she feels better can come off of topiramate.  Patient complains of symptoms per HPI as well as the following symptoms: none . Pertinent negatives and positives per HPI. All others negative   Interval history 01/04/2022:   here for f/u of migraines. In 2021 baseline was makes 15 migraine days a month, 20 headache days a month for > 1 year that are moderately severe or severe and affecting her life significantly. Since then we had her multiple medications and now having 6 migraine days a month still cyclical during her period. Triptan works but she wanted to try something different. She feels much better. 6 migraines can still cause a significant burden monthly, we discussed increasing the topiramate, trying some other medications such as Qulipta, we discussed different acute management medications and decided on trying Nurtec.  Patient complains of symptoms per HPI as well as the following symptoms: migraines, improved . Pertinent negatives and positives per HPI. All others negative   Medications tried: Topamax, gabapentin, amitriptyline.  propranolol or any other blood pressure medication contraindicated due to hypotension. Tylenol, aspirin, baclofen, Fioricet, Flexeril, Decadron injection, Benadryl capsule and injection, Aimovig, Emgality, ibuprofen, magnesium, Robaxin, Reglan injections, Zofran, prednisone tablets, Phenergan,  Zoloft, Imitrex, tizanidine, Topamax, rizatriptan, nurtec, ondansetron, botox  Interval history 09/24/2020: Here for a new problem neck pain. PMHx migraines, chiari malformation. She had one injection of botox for migraines in 03/2020 but none since. The botox helped but she is waiting for new insurance. She liked Ajovy. Imitrex works acutely. Migraines are back to their baseline since she is not on anything. She is having a lot of neck pain with the headaches. She did not go to dry needling. She can have severe neck pain, But one day she woke up with her neck she has radiation down the right arm, weakness in the right arm, she has tried conservative measures for > 3 months,   MRI brain normal, personally reviewed images  HPI 12/27/2019:  CARYL FATE is a 37 y.o. female here as requested by [name], NP for migraines. PMHx chiari malformation.Started around the age of 50 they were severe and sent her to the ED vomiting, she was on several medications, they improved in her early 63s and then started worsening a few years ago and continue to worsen. They are most often during menses but also throughout the month. Migraines are on the right, she has a lot of neck pain and starts there, they are pulsating/pounding/throbbing, photo/phono nausea vomiting, movement makes them worse and they last 24-72 hours, no medication overuse,  15 migraine days a month, 20 headache days a month for > 1 year that are moderately severe or severe and affecting her life significantly. She tried continuous birth control but anything she tries makes the migraines worse even progesterone only IUD. Her headaches were better with her first 2 pregnanies. Blurry vision with the migraines. She does not have auras except rarely,  she has morning headaches, bending over makes her headaches much worse. She wakes often with them and once she gets up they may get better. They can last 24-72 hours. Imitrex helps. The migraines are  moderately severe to severe and affects her life. Sinuses are not affecting her, she reports no sinus issues. No other focal neurologic deficits, associated symptoms, inciting events or modifiable factors.  Medications tried: Topamax, gabapentin, amitriptyline, propranolol or any other blood pressure medication contraindicated due to hypotension  Reviewed notes, labs and imaging from outside physicians, which showed:  CMP unremarkable (slighty low Na 133 stable), tsh normal 07/27/2019  CT Maxillofacial reviewed images and agree with the following:  Clinical data: Sinusitis and cough.  LIMITED CT SCAN OF THE PARANASAL SINUSES WITHOUT CONTRAST:  Scans were performed in the axial plane through the paranasal sinuses. The paranasal sinuses are well visualized and there is no significant opacification. There is some very minimal mucosal thickening and a few tiny ethmoid air cells, but there are no air fluid levels or significant mucosal thickening. There is no significant nasal septal deviation. The mastoid air cells and middle ear cavities are clear.   IMPRESSION:  Essentially normal exam of the paranasal sinuses  Review of Systems: Patient complains of symptoms per HPI as well as the following symptoms: neck pain, headaches . Pertinent negatives and positives per HPI. All others negative    Social History   Socioeconomic History   Marital status: Married    Spouse name: Not on file   Number of children: 3   Years of education: Not on file   Highest education level: Bachelor's degree (e.g., BA, AB, BS)  Occupational History   Not on file  Tobacco Use   Smoking status: Never   Smokeless tobacco: Never  Vaping Use   Vaping Use: Never used  Substance and Sexual Activity   Alcohol use: Never   Drug use: Never   Sexual activity: Yes    Birth control/protection: Surgical    Comment: tubal ligation  Other Topics Concern   Not on file  Social History Narrative   Lives at home with  husband & 3 children   Right handed   Caffeine: 2 cups per day, diet coke   Social Determinants of Health   Financial Resource Strain: Not on file  Food Insecurity: Not on file  Transportation Needs: Not on file  Physical Activity: Not on file  Stress: Not on file  Social Connections: Not on file  Intimate Partner Violence: Not on file    Family History  Problem Relation Age of Onset   Heart failure Maternal Grandmother    Hypertension Maternal Grandmother    Thyroid disease Maternal Grandmother    Arthritis Maternal Grandmother    Heart attack Maternal Grandmother    CAD Maternal Grandmother    Migraines Maternal Grandmother    Depression Mother    Hyperlipidemia Mother    Hypertension Mother    Migraines Mother    Hypertension Maternal Grandfather    Kidney disease Maternal Grandfather    Epilepsy Maternal Grandfather     Past Medical History:  Diagnosis Date   Acute upper respiratory infection 11/04/2010   Formatting of this note might be different from the original. Upper Respiratory Infection  10/1 IMO update   Adjustment disorder with anxiety 11/06/2010   Formatting of this note might be different from the original. Adjustment Disorder With Anxiety   Anxiety    Asthma    Attention deficit hyperactivity disorder,  predominantly inattentive type 10/17/2019   Bilateral impacted cerumen 10/04/2017   Bullous myringitis of left ear 10/04/2017   Complication of anesthesia    COVID-19 01/2019   scrachy throat x few days all symptoms resolved   Depression    Ehlers-Danlos disease    Fatigue 06/20/2014   Gastroenteritis, non-infectious 06/20/2014   Glycosuria 06/20/2014   Headache    History of gestational hypertension 2019   History of TB skin testing 09/22/2015   History of varicella infection 06/20/2014   IBS (irritable bowel syndrome)    Lower urinary tract infectious disease 06/20/2014   Formatting of this note might be different from the original. IMO Problem List  Replacer Jan. 2016 Formatting of this note might be different from the original. Formatting of this note might be different from the original. IMO Problem List Replacer Jan. 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   Lumbar degenerative disc disease 06/20/2014   Allergic rhinitis 06/16/2010   Irritable bowel syndrome 03/08/2008   Ehlers-Danlos syndrome, unspecified 03/08/2008   Mild asthma without complication 01/27/2008   Chronic migraine without aura without status migrainosus, not intractable 01/27/2008    Past Surgical History:  Procedure Laterality Date   CHOLECYSTECTOMY  2002   COLPOSCOPY  2004   COMBINED HYSTEROSCOPY DIAGNOSTIC / D&C  2004   DILITATION & CURRETTAGE/HYSTROSCOPY WITH HYDROTHERMAL ABLATION N/A 05/29/2020   Procedure: DILATATION & CURETTAGE/HYSTEROSCOPY WITH NOVASURE ABLATION;  Surgeon: Mitchel Honour, DO;  Location: [facility];  Service: Gynecology;  Laterality: N/A;   HYSTEROSCOPY WITH D & C  2004   KNEE ARTHROSCOPY Right 1998 & 1999   LAPAROSCOPIC TUBAL LIGATION Bilateral 05/29/2020   Procedure: BILATERAL LAPAROSCOPIC TUBAL LIGATION WITH FILSHIE CLIPS;  Surgeon: Mitchel Honour, DO;  Location: [facility];  Service: Gynecology;  Laterality: Bilateral;   sinus sugrery  1998    TONSILLECTOMY  as child   w/ Adenoidectomy    Current Outpatient Medications  Medication Sig Dispense Refill   Atogepant (QULIPTA) 60 MG TABS Take 1 tablet (60 mg total) by mouth daily. 30 tablet 11   cyclobenzaprine (FLEXERIL) 10 MG tablet TAKE 1 TABLET BY MOUTH AT BEDTIME 90 tablet 0   hydrOXYzine (ATARAX) 25 MG tablet Take 1 tablet by mouth daily. 90 tablet 1   lisdexamfetamine (VYVANSE) 50 MG capsule Take 1 capsule (50 mg total) by mouth daily. 30 capsule 0   methocarbamol (ROBAXIN) 500 MG tablet Take 1 tablet (500 mg total) by mouth every 8 (eight) hours as needed for muscle spasms. 90 tablet 1   ondansetron (ZOFRAN-ODT) 4 MG disintegrating tablet Take 1 tablet (4 mg total) by mouth every 8 (eight) hours as needed for nausea or vomiting. 20 tablet 0   Rimegepant Sulfate (NURTEC) 75 MG TBDP Take 1 tablet (75 mg) by mouth daily as needed for migraines.  Take as close to onset of migraine as possible. Max daily dose: 1 tablet 16 tablet 12   sertraline (ZOLOFT) 100 MG tablet Take 1 tablet by mouth at bedtime. 30 tablet 0   SUMAtriptan (IMITREX) 100 MG tablet TAKE 1 TABLET BY MOUTH AT ONSET OF HEADACHE. MAY REPEAT IN 2 HOURS IF HEADACHE PERSISTS OR RECURS. NO MORE THAN 2 TABLET IN 24 HOURS 9 tablet 11   topiramate (TOPAMAX) 50 MG tablet TAKE 1 TABLET(50 MG) BY MOUTH AT BEDTIME 90 tablet 3   tretinoin (RETIN-A) 0.025 % cream Apply to the face in the evening 45 g 3   Ubrogepant (UBRELVY) 100 MG TABS Take 1 tablet (100 mg total) by mouth every 2 (two) hours as needed. Maximum 200mg  a day. 16 tablet 11   albuterol (VENTOLIN HFA) 108 (90 Base) MCG/ACT inhaler INHALE 2 PUFFS BY MOUTH EVERY 6 HOURS AS NEEDED FOR WHEEZING OR SHORTNESS OF BREATH 18 g 0   lisdexamfetamine (VYVANSE) 50 MG capsule Take 1 capsule (50 mg total) by mouth daily before breakfast. 30 capsule 0   No current facility-administered medications for this visit.    Allergies as of 12/06/2022 - Review Complete 12/06/2022  Allergen  Reaction Noted   Cephalexin Hives 09/01/2009   Amoxicillin-pot clavulanate Rash 05/21/2022    Vitals: BP 117/75   Pulse 78   Ht 5\' 4"  (1.626 m)   Wt 154 lb (69.9 kg)   BMI 26.43 kg/m  Last Weight:  Wt Readings from Last 1 Encounters:  12/06/22 154 lb (69.9 kg)   Last Height:   Ht Readings from Last 1 Encounters:  12/06/22 5\' 4"  (1.626 m)   Exam: NAD, pleasant                  Speech:    Speech is normal; fluent and spontaneous with normal comprehension.  Cognition:    The patient is oriented to person, place, and time;     recent and remote memory intact;     language fluent;    Cranial Nerves:    The pupils are equal, round, and reactive to light. The face is symmetric. The palate elevates in the midline. Hearing intact. Voice is normal. Shoulder shrug is normal. The tongue has normal motion without fasciculations.   Coordination:  No dysmetria noted  Motor Observation:    No asymmetry, no atrophy, and no involuntary movements noted. Tone:    Appears to have Normal muscle tone.     Strength:    Strength is equal and antigraviry in the upper and lower limbs without focal deficit     Sensation: intact to LT  Gait normal    Assessment/Plan:  37 year old with migraines without aura also rarely with aura. MRI brain normal.    12/06/2022: She is here.  6 migraines a month. Nurtec doesn't work as well, imitrex helps acutely. Weather related. < 10 total headache days a month. Currently on topiramate doesn't want to increase it having side effects. imitrex good acutely but wants to try ubrelvy see if it is better. We discussed options.  Can also consider Ajovy, Vyepti if she worsens. We discussed qulipta and ubrelvy at this time, if she feels better can come off of topiramate.   Tried botox them maybe couldn't afford it  In the past tried Dry needling: [facility] or PT referral for dry needling [place]: Evaluate and treat for cervical myofascial pain syndrome and  migraines including Dry Needling  Medications tried: Topamax, gabapentin,  amitriptyline.  propranolol or any other blood pressure medication contraindicated due to hypotension. Tylenol, aspirin, baclofen, Fioricet, Flexeril, Decadron injection, Benadryl capsule and injection, Aimovig, Emgality, ibuprofen, magnesium, Robaxin, Reglan injections, Zofran, prednisone tablets, Phenergan, Zoloft, Imitrex, tizanidine, Topamax, rizatriptan, nurtec, ondansetron, botox  In the past discussed:  There is increased risk for stroke in women with migraine with aura and a contraindication for the combined contraceptive pill for use by women who have migraine with aura. The risk for women with migraine without aura is lower. However other risk factors like smoking are far more likely to increase stroke risk than migraine. There is a recommendation for no smoking and for the use of OCPs without estrogen such as progestogen only pills particularly for women with migraine with aura.Marland Kitchen People who have migraine headaches with auras may be 3 times more likely to have a stroke caused by a blood clot, compared to migraine patients who don't see auras. Women who take hormone-replacement therapy may be 30 percent more likely to suffer a clot-based stroke than women not taking medication containing estrogen. Other risk factors like smoking and high blood pressure may be  much more important.  Cc: [name], NP  Naomie Dean, MD  [facility] [address] [place], [place] [address]  Phone [phone] Fax [phone]  I spent 30 minutes of face-to-face and non-face-to-face time with patient on the  1. Migraine without aura and without status migrainosus, not intractable    diagnosis.  This included previsit chart review, lab review, study review, order entry, electronic health record documentation, patient education on the different diagnostic and therapeutic options, counseling and  coordination of care, risks and benefits of management, compliance, or risk factor reduction

## 2022-12-17 ENCOUNTER — Ambulatory Visit: Payer: Self-pay | Admitting: Family

## 2022-12-22 ENCOUNTER — Telehealth: Payer: Self-pay | Admitting: *Deleted

## 2022-12-22 ENCOUNTER — Other Ambulatory Visit (HOSPITAL_COMMUNITY): Payer: Self-pay

## 2022-12-22 ENCOUNTER — Other Ambulatory Visit: Payer: Self-pay | Admitting: *Deleted

## 2022-12-22 ENCOUNTER — Encounter: Payer: Self-pay | Admitting: Neurology

## 2022-12-22 DIAGNOSIS — G43009 Migraine without aura, not intractable, without status migrainosus: Secondary | ICD-10-CM

## 2022-12-22 MED ORDER — SUMATRIPTAN SUCCINATE 100 MG PO TABS
ORAL_TABLET | ORAL | 5 refills | Status: DC
  Filled 2022-12-22: qty 9, 30d supply, fill #0

## 2022-12-22 MED ORDER — UBRELVY 100 MG PO TABS
100.0000 mg | ORAL_TABLET | ORAL | 11 refills | Status: AC | PRN
  Filled 2022-12-22: qty 16, 30d supply, fill #0
  Filled 2023-04-24: qty 16, 30d supply, fill #1
  Filled 2023-07-12: qty 16, 30d supply, fill #2

## 2022-12-22 NOTE — Telephone Encounter (Signed)
Completed Ubrelvy PA on CMM. Key: [id number].

## 2022-12-24 ENCOUNTER — Encounter: Payer: Self-pay | Admitting: Family

## 2022-12-24 ENCOUNTER — Other Ambulatory Visit (HOSPITAL_COMMUNITY): Payer: Self-pay

## 2022-12-24 ENCOUNTER — Ambulatory Visit (INDEPENDENT_AMBULATORY_CARE_PROVIDER_SITE_OTHER): Payer: 59 | Admitting: Family

## 2022-12-24 DIAGNOSIS — F411 Generalized anxiety disorder: Secondary | ICD-10-CM | POA: Diagnosis not present

## 2022-12-24 DIAGNOSIS — F900 Attention-deficit hyperactivity disorder, predominantly inattentive type: Secondary | ICD-10-CM | POA: Diagnosis not present

## 2022-12-24 MED ORDER — LISDEXAMFETAMINE DIMESYLATE 50 MG PO CAPS
50.0000 mg | ORAL_CAPSULE | Freq: Every day | ORAL | 0 refills | Status: DC
  Filled 2022-12-24: qty 90, 90d supply, fill #0

## 2022-12-24 MED ORDER — SERTRALINE HCL 100 MG PO TABS
100.0000 mg | ORAL_TABLET | Freq: Every day | ORAL | 1 refills | Status: DC
  Filled 2022-12-24: qty 90, 90d supply, fill #0
  Filled 2023-02-01 – 2023-03-24 (×3): qty 90, 90d supply, fill #1

## 2022-12-24 NOTE — Assessment & Plan Note (Signed)
   chronic  stable on Zoloft 100mg qd  sending refill  f/u in 6 mos 

## 2022-12-24 NOTE — Progress Notes (Signed)
Patient ID: Alison Nichols, female    DOB: Jul 16, 1986, 37 y.o.   MRN: [id number]  Chief Complaint  Patient presents with   Anxiety    Follow up    ADHD    HPI: Anxiety/Depression: Patient complains of anxiety disorder and hx of depression.   She has the following symptoms: none.  Onset of symptoms was approximately  years ago, She denies current suicidal and homicidal ideation.  Possible organic causes contributing are: none.  Risk factors: previous episode of depression  Previous treatment includes Zoloft and Vistaril.  She complains of the following side effects from the treatment: none. ADHD f/u: Medications helping target goals: Vyvanse 50mg  - over a year Regimen: daily, work days mostly Medication side effects/concerns: none Weight: no change Sleep: denies issues Mood changes: denies Tics: denies Blood pressure, Weight, Pulse, Behavior reviewed: wnl  Assessment & Plan:  ADHD, predominantly inattentive type Assessment & Plan: chronic, stable taking Vyvanse 50mg  qd - doing well, denies SE sending refill for 90 pills f/u in 3 mos or prn virtual or in-office  Orders: -     Lisdexamfetamine Dimesylate; Take 1 capsule (50 mg total) by mouth daily.  Dispense: 90 capsule; Refill: 0  Generalized anxiety disorder Assessment & Plan: chronic stable on Zoloft 100mg  qd sending refill f/u in 6 mos  Orders: -     Sertraline HCl; Take 1 tablet by mouth at bedtime.  Dispense: 90 tablet; Refill: 1   Subjective:    Outpatient Medications Prior to Visit  Medication Sig Dispense Refill   Atogepant (QULIPTA) 60 MG TABS Take 1 tablet (60 mg total) by mouth daily. 30 tablet 11   cyclobenzaprine (FLEXERIL) 10 MG tablet TAKE 1 TABLET BY MOUTH AT BEDTIME 90 tablet 0   hydrOXYzine (ATARAX) 25 MG tablet Take 1 tablet by mouth daily. 90 tablet 1   methocarbamol (ROBAXIN) 500 MG tablet Take 1 tablet (500 mg total) by mouth every 8 (eight) hours as needed for muscle spasms. 90 tablet  1   ondansetron (ZOFRAN-ODT) 4 MG disintegrating tablet Take 1 tablet (4 mg total) by mouth every 8 (eight) hours as needed for nausea or vomiting. 20 tablet 0   SUMAtriptan (IMITREX) 100 MG tablet TAKE 1 TABLET BY MOUTH AT ONSET OF HEADACHE. MAY REPEAT IN 2 HOURS IF HEADACHE PERSISTS OR RECURS. NO MORE THAN 2 TABLET IN 24 HOURS 9 tablet 5   topiramate (TOPAMAX) 50 MG tablet TAKE 1 TABLET(50 MG) BY MOUTH AT BEDTIME 90 tablet 3   tretinoin (RETIN-A) 0.025 % cream Apply to the face in the evening 45 g 3   Ubrogepant (UBRELVY) 100 MG TABS Take 1 tablet (100 mg total) by mouth every 2 (two) hours as needed. Maximum 200mg  a day. 16 tablet 11   lisdexamfetamine (VYVANSE) 50 MG capsule Take 1 capsule (50 mg total) by mouth daily. 30 capsule 0   sertraline (ZOLOFT) 100 MG tablet Take 1 tablet by mouth at bedtime. 30 tablet 0   albuterol (VENTOLIN HFA) 108 (90 Base) MCG/ACT inhaler INHALE 2 PUFFS BY MOUTH EVERY 6 HOURS AS NEEDED FOR WHEEZING OR SHORTNESS OF BREATH 18 g 0   lisdexamfetamine (VYVANSE) 50 MG capsule Take 1 capsule (50 mg total) by mouth daily before breakfast. 30 capsule 0   Rimegepant Sulfate (NURTEC) 75 MG TBDP Take 1 tablet (75 mg) by mouth daily as needed for migraines. Take as close to onset of migraine as possible. Max daily dose: 1 tablet (Patient not taking: Reported on 12/24/2022)  16 tablet 12   No facility-administered medications prior to visit.   Past Medical History:  Diagnosis Date   Acute upper respiratory infection 11/04/2010   Formatting of this note might be different from the original. Upper Respiratory Infection  10/1 IMO update   Adjustment disorder with anxiety 11/06/2010   Formatting of this note might be different from the original. 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   Bilateral impacted cerumen 10/04/2017   Bullous myringitis of left ear 10/04/2017    Complication of anesthesia    COVID-19 01/2019   scrachy throat x few days all symptoms resolved   Depression    Ehlers-Danlos disease    Fatigue 06/20/2014   Gastroenteritis, non-infectious 06/20/2014   Glycosuria 06/20/2014   Headache    History of gestational hypertension 2019   History of TB skin testing 09/22/2015   History of varicella infection 06/20/2014   IBS (irritable bowel syndrome)    Lower urinary tract infectious disease 06/20/2014   Formatting of this note might be different from the original. IMO Problem List Replacer Jan. 2016 Formatting of this note might be different from the original. Formatting of this note might be different from the original. IMO Problem List Replacer Jan. 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 Adjustment Disorder With Anxiety And Prolonged Depressed Mood   Scoliosis    Vaginal Pap smear, abnormal    Wears glasses 09/22/2015   Past Surgical History:  Procedure Laterality Date   CHOLECYSTECTOMY  2002   COLPOSCOPY  2004   COMBINED HYSTEROSCOPY DIAGNOSTIC / D&C  2004   DILITATION & CURRETTAGE/HYSTROSCOPY WITH HYDROTHERMAL ABLATION N/A 05/29/2020   Procedure: DILATATION & CURETTAGE/HYSTEROSCOPY WITH NOVASURE ABLATION;  Surgeon: Mitchel Honour, DO;  Location: [facility];  Service: Gynecology;  Laterality: N/A;   HYSTEROSCOPY WITH D & C  2004   KNEE ARTHROSCOPY Right 1998 & 1999   LAPAROSCOPIC TUBAL LIGATION Bilateral 05/29/2020   Procedure: BILATERAL LAPAROSCOPIC TUBAL LIGATION WITH FILSHIE CLIPS;  Surgeon: Mitchel Honour, DO;  Location: [facility];  Service: Gynecology;  Laterality: Bilateral;   sinus  sugrery  1998   TONSILLECTOMY  as child   w/ Adenoidectomy   Allergies  Allergen Reactions   Cephalexin Hives    Keflex is hives   Amoxicillin-Pot Clavulanate Rash      Objective:    Physical Exam Vitals and nursing note reviewed.  Constitutional:      Appearance: Normal appearance.  Cardiovascular:     Rate and Rhythm: Normal rate and regular rhythm.  Pulmonary:     Effort: Pulmonary effort is normal.     Breath sounds: Normal breath sounds.  Musculoskeletal:        General: Normal range of motion.  Skin:    General: Skin is warm and dry.  Neurological:     Mental Status: She is alert.  Psychiatric:        Mood and Affect: Mood normal.        Behavior: Behavior normal.    BP 108/73   Pulse 77  Temp (!) 97.5 F (36.4 C) (Temporal)   Ht 5\' 7"  (1.702 m)   Wt 153 lb 9.6 oz (69.7 kg)   SpO2 100%   BMI 24.06 kg/m  Wt Readings from Last 3 Encounters:  12/24/22 153 lb 9.6 oz (69.7 kg)  12/06/22 154 lb (69.9 kg)  05/21/22 152 lb 9.6 oz (69.2 kg)       [name], NP

## 2022-12-24 NOTE — Assessment & Plan Note (Signed)
chronic, stable taking Vyvanse 50mg  qd - doing well, denies SE sending refill for 90 pills f/u in 3 mos or prn virtual or in-office

## 2022-12-27 ENCOUNTER — Other Ambulatory Visit (HOSPITAL_COMMUNITY): Payer: Self-pay

## 2022-12-27 MED ORDER — QULIPTA 60 MG PO TABS
60.0000 mg | ORAL_TABLET | Freq: Every day | ORAL | 11 refills | Status: DC
  Filled 2022-12-27: qty 30, 30d supply, fill #0
  Filled 2023-01-25: qty 30, 30d supply, fill #1
  Filled 2023-02-25: qty 30, 30d supply, fill #2
  Filled 2023-03-28: qty 30, 30d supply, fill #3
  Filled 2023-04-24: qty 30, 30d supply, fill #4
  Filled 2023-06-01: qty 30, 30d supply, fill #5
  Filled 2023-06-27: qty 30, 30d supply, fill #6
  Filled 2023-07-28: qty 30, 30d supply, fill #7
  Filled 2023-08-26: qty 30, 30d supply, fill #8
  Filled 2023-09-23 – 2023-10-04 (×2): qty 30, 30d supply, fill #9
  Filled 2023-11-01: qty 30, 30d supply, fill #10
  Filled 2023-11-29 – 2023-12-01 (×2): qty 30, 30d supply, fill #11

## 2022-12-29 ENCOUNTER — Other Ambulatory Visit (HOSPITAL_COMMUNITY): Payer: Self-pay

## 2022-12-29 ENCOUNTER — Telehealth: Payer: Self-pay | Admitting: *Deleted

## 2022-12-29 NOTE — Telephone Encounter (Signed)
Completed Qulipta PA on CMM. Key: [id number]. Approved immediately for an initial period through 07/01/2023.

## 2023-01-20 DIAGNOSIS — R2231 Localized swelling, mass and lump, right upper limb: Secondary | ICD-10-CM | POA: Diagnosis not present

## 2023-01-20 DIAGNOSIS — M79644 Pain in right finger(s): Secondary | ICD-10-CM | POA: Diagnosis not present

## 2023-01-20 DIAGNOSIS — Q796 Ehlers-Danlos syndrome, unspecified: Secondary | ICD-10-CM | POA: Diagnosis not present

## 2023-01-20 DIAGNOSIS — M654 Radial styloid tenosynovitis [de Quervain]: Secondary | ICD-10-CM | POA: Diagnosis not present

## 2023-01-22 HISTORY — PX: WRIST SURGERY: SHX841

## 2023-02-01 ENCOUNTER — Other Ambulatory Visit (HOSPITAL_COMMUNITY): Payer: Self-pay

## 2023-02-02 ENCOUNTER — Encounter: Payer: Self-pay | Admitting: Neurology

## 2023-02-02 ENCOUNTER — Other Ambulatory Visit (HOSPITAL_COMMUNITY): Payer: Self-pay

## 2023-02-02 ENCOUNTER — Other Ambulatory Visit: Payer: Self-pay | Admitting: Neurology

## 2023-02-02 MED ORDER — ONDANSETRON 4 MG PO TBDP
4.0000 mg | ORAL_TABLET | Freq: Three times a day (TID) | ORAL | 11 refills | Status: AC | PRN
  Filled 2023-02-02: qty 30, 5d supply, fill #0
  Filled 2023-04-24: qty 30, 5d supply, fill #1

## 2023-02-10 ENCOUNTER — Other Ambulatory Visit (HOSPITAL_BASED_OUTPATIENT_CLINIC_OR_DEPARTMENT_OTHER): Payer: Self-pay

## 2023-02-10 MED ORDER — DOXYCYCLINE HYCLATE 100 MG PO CAPS
100.0000 mg | ORAL_CAPSULE | Freq: Two times a day (BID) | ORAL | 0 refills | Status: DC
  Filled 2023-02-10: qty 20, 10d supply, fill #0

## 2023-02-10 MED ORDER — OXYCODONE HCL 5 MG PO TABS
5.0000 mg | ORAL_TABLET | ORAL | 0 refills | Status: DC
  Filled 2023-02-10: qty 42, 7d supply, fill #0

## 2023-03-07 ENCOUNTER — Other Ambulatory Visit: Payer: Self-pay | Admitting: Oncology

## 2023-03-07 DIAGNOSIS — Z006 Encounter for examination for normal comparison and control in clinical research program: Secondary | ICD-10-CM

## 2023-03-26 ENCOUNTER — Other Ambulatory Visit (HOSPITAL_COMMUNITY): Payer: Self-pay

## 2023-03-30 ENCOUNTER — Other Ambulatory Visit (HOSPITAL_COMMUNITY): Payer: Self-pay

## 2023-03-31 ENCOUNTER — Encounter: Payer: Self-pay | Admitting: Family

## 2023-04-06 ENCOUNTER — Encounter: Payer: Self-pay | Admitting: Family

## 2023-04-06 ENCOUNTER — Ambulatory Visit (INDEPENDENT_AMBULATORY_CARE_PROVIDER_SITE_OTHER): Payer: 59 | Admitting: Family

## 2023-04-06 VITALS — BP 118/82 | HR 80 | Temp 97.7°F | Ht 67.0 in | Wt 147.8 lb

## 2023-04-06 DIAGNOSIS — E878 Other disorders of electrolyte and fluid balance, not elsewhere classified: Secondary | ICD-10-CM | POA: Diagnosis not present

## 2023-04-06 DIAGNOSIS — N644 Mastodynia: Secondary | ICD-10-CM

## 2023-04-06 DIAGNOSIS — Z0000 Encounter for general adult medical examination without abnormal findings: Secondary | ICD-10-CM | POA: Diagnosis not present

## 2023-04-06 DIAGNOSIS — E782 Mixed hyperlipidemia: Secondary | ICD-10-CM

## 2023-04-06 LAB — COMPREHENSIVE METABOLIC PANEL
ALT: 9 U/L (ref 0–35)
AST: 12 U/L (ref 0–37)
Albumin: 4.6 g/dL (ref 3.5–5.2)
Alkaline Phosphatase: 78 U/L (ref 39–117)
BUN: 10 mg/dL (ref 6–23)
CO2: 27 mEq/L (ref 19–32)
Calcium: 9.5 mg/dL (ref 8.4–10.5)
Chloride: 101 mEq/L (ref 96–112)
Creatinine, Ser: 0.7 mg/dL (ref 0.40–1.20)
GFR: 109.36 mL/min (ref >60.00)
Glucose, Bld: 101 mg/dL — ABNORMAL HIGH (ref 70–99)
Potassium: 3.4 mEq/L — ABNORMAL LOW (ref 3.5–5.1)
Sodium: 134 mEq/L — ABNORMAL LOW (ref 135–145)
Total Bilirubin: 0.5 mg/dL (ref 0.2–1.2)
Total Protein: 7.5 g/dL (ref 6.0–8.3)

## 2023-04-06 LAB — CBC WITH DIFFERENTIAL/PLATELET
Basophils Absolute: 0.2 10*3/uL — ABNORMAL HIGH (ref 0.0–0.1)
Basophils Relative: 2.8 % (ref 0.0–3.0)
Eosinophils Absolute: 0 10*3/uL (ref 0.0–0.7)
Eosinophils Relative: 0.5 % (ref 0.0–5.0)
HCT: 43.8 % (ref 36.0–46.0)
Hemoglobin: 14.3 g/dL (ref 12.0–15.0)
Lymphocytes Relative: 27.6 % (ref 12.0–46.0)
Lymphs Abs: 1.9 10*3/uL (ref 0.7–4.0)
MCHC: 32.6 g/dL (ref 30.0–36.0)
MCV: 92.6 fl (ref 78.0–100.0)
Monocytes Absolute: 0.5 10*3/uL (ref 0.1–1.0)
Monocytes Relative: 6.4 % (ref 3.0–12.0)
Neutro Abs: 4.4 10*3/uL (ref 1.4–7.7)
Neutrophils Relative %: 62.7 % (ref 43.0–77.0)
Platelets: 319 10*3/uL (ref 150.0–400.0)
RBC: 4.73 Mil/uL (ref 3.87–5.11)
RDW: 14.1 % (ref 11.5–15.5)
WBC: 7 10*3/uL (ref 4.0–10.5)

## 2023-04-06 LAB — LIPID PANEL
Cholesterol: 214 mg/dL — ABNORMAL HIGH (ref 0–200)
HDL: 71.5 mg/dL (ref >39.00)
LDL Cholesterol: 128 mg/dL — ABNORMAL HIGH (ref 0–99)
NonHDL: 142.22
Total CHOL/HDL Ratio: 3
Triglycerides: 73 mg/dL (ref 0.0–149.0)
VLDL: 14.6 mg/dL (ref 0.0–40.0)

## 2023-04-06 NOTE — Progress Notes (Signed)
Phone [phone]  Subjective:   Patient is a 37 y.o. female presenting for annual physical.    Chief Complaint  Patient presents with   Annual Exam    Fasting. Right breast pain. Requesting pap smear from [facility]. Declined tdap   HPI: Right breast pain:  top of breast pt feels a tingling, mostly burning sensation. She complains of right-sided burning pain at the top & just above the breast, suspected to be shingles, and described a tingling sensation without visible rash or lumps,and has been experiencing it for approximately two weeks. She reports having a mammogram in her 41s d/t galactorrhea thought to be r/t taking Zoloft. Has not occurred again outside of pregnancy.  See problem oriented charting- ROS- full  review of systems was completed and negative.   The following were reviewed and entered/updated in epic: Past Medical History:  Diagnosis Date   Acute upper respiratory infection 11/04/2010   Formatting of this note might be different from the original. Upper Respiratory Infection  10/1 IMO update   Adjustment disorder with anxiety 11/06/2010   Formatting of this note might be different from the original. 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   Bilateral impacted cerumen 10/04/2017   Bullous myringitis of left ear 10/04/2017   Complication of anesthesia    COVID-19 01/2019   scrachy throat x few days all symptoms resolved   Depression    Ehlers-Danlos disease    Fatigue 06/20/2014   Gastroenteritis, non-infectious 06/20/2014   Glycosuria 06/20/2014   Headache    History of gestational hypertension 2019   History of TB skin testing 09/22/2015   History of varicella infection 06/20/2014   IBS (irritable bowel syndrome)    Lower urinary tract infectious disease 06/20/2014   Formatting of this note might be different from the original. IMO Problem  List Replacer Jan. 2016 Formatting of this note might be different from the original. Formatting of this note might be different from the original. IMO Problem List Replacer Jan. 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   Generalized anxiety disorder 11/25/2021   Dysautonomia (HCC) 07/15/2020   Vitamin D deficiency 07/30/2019   ADHD, predominantly inattentive type 04/07/2018   Lumbar degenerative disc disease 06/20/2014   Allergic rhinitis 06/16/2010   Irritable bowel syndrome 03/08/2008   Ehlers-Danlos syndrome, unspecified 03/08/2008   Chronic migraine without aura without status migrainosus, not intractable 01/27/2008   Past Surgical History:  Procedure Laterality Date   CHOLECYSTECTOMY  2002   COLPOSCOPY  2004   COMBINED HYSTEROSCOPY DIAGNOSTIC / D&C  2004   DILITATION & CURRETTAGE/HYSTROSCOPY WITH HYDROTHERMAL ABLATION N/A 05/29/2020   Procedure: DILATATION & CURETTAGE/HYSTEROSCOPY WITH NOVASURE ABLATION;  Surgeon: Mitchel Honour, DO;  Location:  [facility];  Service: Gynecology;  Laterality: N/A;   HYSTEROSCOPY WITH D & C  2004   KNEE ARTHROSCOPY Right 1998 & 1999   LAPAROSCOPIC TUBAL LIGATION Bilateral 05/29/2020   Procedure: BILATERAL LAPAROSCOPIC TUBAL LIGATION WITH FILSHIE CLIPS;  Surgeon: Mitchel Honour, DO;  Location:  [facility];  Service: Gynecology;  Laterality: Bilateral;   sinus sugrery  1998   TONSILLECTOMY  as child   w/ Adenoidectomy   WRIST SURGERY Right 01/2023    Family History  Problem Relation Age of Onset   Heart failure Maternal Grandmother    Hypertension Maternal Grandmother    Thyroid disease Maternal Grandmother    Arthritis Maternal Grandmother    Heart attack Maternal Grandmother    CAD Maternal Grandmother    Migraines Maternal Grandmother    Depression Mother    Hyperlipidemia Mother    Hypertension Mother    Migraines Mother    Hypertension Maternal Grandfather    Kidney disease Maternal Grandfather    Epilepsy Maternal Grandfather     Medications- reviewed and updated Current Outpatient Medications  Medication Sig Dispense Refill   Atogepant (QULIPTA) 60 MG TABS Take 1 tablet (60 mg total) by mouth daily. 30 tablet 11   cetirizine (ZYRTEC) 10 MG tablet Take by mouth.     cyclobenzaprine (FLEXERIL) 10 MG tablet TAKE 1 TABLET BY MOUTH AT BEDTIME 90 tablet 0   hydrOXYzine (ATARAX) 25 MG tablet Take 1 tablet by mouth daily. 90 tablet 1   lisdexamfetamine (VYVANSE) 50 MG capsule Take 1 capsule (50 mg total) by mouth daily. 90 capsule 0   methocarbamol (ROBAXIN) 500 MG tablet Take 1 tablet (500 mg total) by mouth every 8 (eight) hours as needed for muscle spasms. 90 tablet 1   ondansetron (ZOFRAN-ODT) 4 MG disintegrating tablet Dissolve 1 - 2 tablets (4 - 8 mg total) in mouth every 8 hours as needed. 30 tablet 11   sertraline (ZOLOFT) 100 MG tablet Take 1 tablet by mouth at bedtime. 90 tablet 1   SUMAtriptan (IMITREX) 100 MG tablet TAKE 1 TABLET BY MOUTH AT ONSET OF HEADACHE. MAY REPEAT IN 2 HOURS IF HEADACHE PERSISTS OR RECURS. NO MORE THAN 2 TABLET IN 24 HOURS 9 tablet 5   tretinoin (RETIN-A) 0.025 % cream Apply to the face in the evening 45 g 3   Ubrogepant (UBRELVY) 100 MG TABS Take 1 tablet (100 mg total) by mouth every 2 (two) hours as needed. Maximum  200mg  a day. 16 tablet 11   albuterol (VENTOLIN HFA) 108 (90 Base) MCG/ACT inhaler INHALE 2 PUFFS BY MOUTH EVERY 6 HOURS AS NEEDED FOR WHEEZING OR SHORTNESS OF BREATH 18 g 0   lisdexamfetamine (VYVANSE) 50 MG capsule Take 1 capsule (50 mg total) by mouth daily before breakfast. 30 capsule 0   oxyCODONE (OXY IR/ROXICODONE) 5 MG immediate release tablet Take 1 tablet (5 mg total) by mouth every 4-6 hours as needed for pain. 42 tablet 0   No current facility-administered medications for this visit.    Allergies-reviewed and updated Allergies  Allergen Reactions   Cephalexin Hives    Keflex is hives   Amoxicillin-Pot Clavulanate Rash    Social History   Social History Narrative   Lives at home with husband & 3 children   Right handed   Caffeine: 2 cups per day, diet coke    Objective:  BP 118/82 (BP Location: Right Arm, Patient Position: Sitting, Cuff Size: Large)   Pulse 80   Temp 97.7 F (  36.5 C) (Temporal)   Ht 5\' 7"  (1.702 m)   Wt 147 lb 12.8 oz (67 kg)   SpO2 97%   BMI 23.15 kg/m  Physical Exam    Assessment and Plan   Health Maintenance counseling: 1. Anticipatory guidance: Patient counseled regarding regular dental exams q6 months, eye exams,  avoiding smoking and second hand smoke, limiting alcohol to 1 beverage per day, no illicit drugs.   2. Risk factor reduction:  Advised patient of need for regular exercise and diet rich with fruits and vegetables to reduce risk of heart attack and stroke. Wt Readings from Last 3 Encounters:  04/06/23 147 lb 12.8 oz (67 kg)  12/24/22 153 lb 9.6 oz (69.7 kg)  12/06/22 154 lb (69.9 kg)   3. Immunizations/screenings/ancillary studies Immunization History  Administered Date(s) Administered   Hepatitis B, PED/ADOLESCENT 08/24/2003   Influenza, Seasonal, Injecte, Preservative Fre 06/11/2021   Influenza,inj,quad, With Preservative 05/20/2014   Influenza-Unspecified 05/15/2018, 05/28/2019, 05/26/2020   MMR 04/09/2009   Tdap  04/24/2010   Health Maintenance Due  Topic Date Due   DTaP/Tdap/Td (2 - Td or Tdap) 04/24/2020   PAP SMEAR-Modifier  08/10/2020    4. Cervical cancer screening: scheduled with GYN 5. Skin cancer screening- advised regular sunscreen use. Denies worrisome, changing, or new skin lesions. Pt is established with Dermatology for psoriasis.  6. Birth control/STD check: tubal ligation/N/A 7. Smoking associated screening: non- smoker 8. Alcohol screening: rare  Annual physical exam -     CBC with Differential/Platelet -     Comprehensive metabolic panel  Mixed hyperlipidemia Assessment & Plan: chronic no meds, working on lifestyle modifications rechecking lipids today advised on low saturated fat diet, increase exercise where able f/u 50m-3yr  Orders: -     Lipid panel  Breast pain, right- Skin feels fibrous with thicker tissue, no lump or adenomas detected, no erythema. Ordering imaging for reassurance.  -     MM 3D DIAGNOSTIC MAMMOGRAM UNILATERAL RIGHT BREAST; Future -     US BREAST COMPLETE UNI RIGHT INC AXILLA; Future   Recommended follow up:  No follow-ups on file. No future appointments.  Lab/Order associations: fasting    [name], NP

## 2023-04-06 NOTE — Patient Instructions (Addendum)
It was very nice to see you today!   I will review your lab results via MyChart in a few days.  I have sent the order for your breast Ultrasound.  Have a great rest of the summer!       PLEASE NOTE:  If you had any lab tests please let us know if you have not heard back within a few days. You may see your results on MyChart before we have a chance to review them but we will give you a call once they are reviewed by [place]. If we ordered any referrals today, please let us know if you have not heard from their office within the next week.

## 2023-04-06 NOTE — Assessment & Plan Note (Signed)
chronic no meds, working on lifestyle modifications rechecking lipids today advised on low saturated fat diet, increase exercise where able f/u 70m-21yr

## 2023-04-12 NOTE — Addendum Note (Signed)
Addended byDulce Sellar on: 04/12/2023 04:52 PM   Modules accepted: Orders

## 2023-04-13 ENCOUNTER — Encounter: Payer: Self-pay | Admitting: Obstetrics & Gynecology

## 2023-04-14 NOTE — Progress Notes (Signed)
please enter pt PAP done 02/11/2021 - normal - thx

## 2023-04-24 ENCOUNTER — Other Ambulatory Visit: Payer: Self-pay | Admitting: Family

## 2023-04-24 DIAGNOSIS — F900 Attention-deficit hyperactivity disorder, predominantly inattentive type: Secondary | ICD-10-CM

## 2023-04-26 ENCOUNTER — Other Ambulatory Visit: Payer: Self-pay

## 2023-04-27 ENCOUNTER — Other Ambulatory Visit (HOSPITAL_COMMUNITY): Payer: Self-pay

## 2023-04-28 NOTE — Telephone Encounter (Signed)
pt needs visit - can she do virtual?

## 2023-04-29 ENCOUNTER — Ambulatory Visit
Admission: RE | Admit: 2023-04-29 | Discharge: 2023-04-29 | Disposition: A | Discharge status: Home / Self Care | Payer: 59 | Source: Ambulatory Visit | Attending: Family | Admitting: Family

## 2023-04-29 ENCOUNTER — Ambulatory Visit: Payer: 59

## 2023-04-29 DIAGNOSIS — N644 Mastodynia: Secondary | ICD-10-CM

## 2023-05-10 NOTE — Progress Notes (Unsigned)
Patient ID: Alison Nichols, female    DOB: 1985-09-13, 37 y.o.   MRN: [id number]  No chief complaint on file.   HPI: ADHD f/u: Medications helping target goals: Vyvanse 50mg  - over a year Regimen: daily, work days mostly Medication side effects/concerns: none Weight: no change Sleep: denies issues Mood changes: denies Tics: denies Blood pressure, Weight, Pulse, Behavior reviewed: wnl  Assessment & Plan:  There are no diagnoses linked to this encounter.  Subjective:    Outpatient Medications Prior to Visit  Medication Sig Dispense Refill   albuterol (VENTOLIN HFA) 108 (90 Base) MCG/ACT inhaler INHALE 2 PUFFS BY MOUTH EVERY 6 HOURS AS NEEDED FOR WHEEZING OR SHORTNESS OF BREATH 18 g 0   Atogepant (QULIPTA) 60 MG TABS Take 1 tablet (60 mg total) by mouth daily. 30 tablet 11   cetirizine (ZYRTEC) 10 MG tablet Take by mouth.     cyclobenzaprine (FLEXERIL) 10 MG tablet TAKE 1 TABLET BY MOUTH AT BEDTIME 90 tablet 0   hydrOXYzine (ATARAX) 25 MG tablet Take 1 tablet by mouth daily. 90 tablet 1   lisdexamfetamine (VYVANSE) 50 MG capsule Take 1 capsule (50 mg total) by mouth daily before breakfast. 30 capsule 0   lisdexamfetamine (VYVANSE) 50 MG capsule Take 1 capsule (50 mg total) by mouth daily. 90 capsule 0   methocarbamol (ROBAXIN) 500 MG tablet Take 1 tablet (500 mg total) by mouth every 8 (eight) hours as needed for muscle spasms. 90 tablet 1   ondansetron (ZOFRAN-ODT) 4 MG disintegrating tablet Dissolve 1 - 2 tablets (4 - 8 mg total) in mouth every 8 hours as needed. 30 tablet 11   oxyCODONE (OXY IR/ROXICODONE) 5 MG immediate release tablet Take 1 tablet (5 mg total) by mouth every 4-6 hours as needed for pain. 42 tablet 0   sertraline (ZOLOFT) 100 MG tablet Take 1 tablet by mouth at bedtime. 90 tablet 1   SUMAtriptan (IMITREX) 100 MG tablet TAKE 1 TABLET BY MOUTH AT ONSET OF HEADACHE. MAY REPEAT IN 2 HOURS IF HEADACHE PERSISTS OR RECURS. NO MORE THAN 2 TABLET IN 24 HOURS 9 tablet  5   tretinoin (RETIN-A) 0.025 % cream Apply to the face in the evening 45 g 3   Ubrogepant (UBRELVY) 100 MG TABS Take 1 tablet (100 mg total) by mouth every 2 (two) hours as needed. Maximum 200mg  a day. 16 tablet 11   No facility-administered medications prior to visit.   Past Medical History:  Diagnosis Date   Acute upper respiratory infection 11/04/2010   Formatting of this note might be different from the original. Upper Respiratory Infection  10/1 IMO update   Adjustment disorder with anxiety 11/06/2010   Formatting of this note might be different from the original. 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   Bilateral impacted cerumen 10/04/2017   Bullous myringitis of left ear 10/04/2017   Complication of anesthesia    COVID-19 01/2019   scrachy throat x few days all symptoms resolved   Depression    Ehlers-Danlos disease    Fatigue 06/20/2014   Gastroenteritis, non-infectious 06/20/2014   Glycosuria 06/20/2014   Headache    History of gestational hypertension 2019   History of TB skin testing 09/22/2015   History of varicella infection 06/20/2014   IBS (irritable bowel syndrome)    Lower urinary tract infectious disease 06/20/2014   Formatting of this note might be different from  the original. IMO Problem List Replacer Jan. 2016 Formatting of this note might be different from the original. Formatting of this note might be different from the original. IMO Problem List Replacer Jan. 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 Adjustment Disorder With Anxiety And Prolonged  Depressed Mood   Scoliosis    Vaginal Pap smear, abnormal    Wears glasses 09/22/2015   Past Surgical History:  Procedure Laterality Date   CHOLECYSTECTOMY  2002   COLPOSCOPY  2004   COMBINED HYSTEROSCOPY DIAGNOSTIC / D&C  2004   DILITATION & CURRETTAGE/HYSTROSCOPY WITH HYDROTHERMAL ABLATION N/A 05/29/2020   Procedure: DILATATION & CURETTAGE/HYSTEROSCOPY WITH NOVASURE ABLATION;  Surgeon: Mitchel Honour, DO;  Location: [facility];  Service: Gynecology;  Laterality: N/A;   HYSTEROSCOPY WITH D & C  2004   KNEE ARTHROSCOPY Right 1998 & 1999   LAPAROSCOPIC TUBAL LIGATION Bilateral 05/29/2020   Procedure: BILATERAL LAPAROSCOPIC TUBAL LIGATION WITH FILSHIE CLIPS;  Surgeon: Mitchel Honour, DO;  Location: [facility];  Service: Gynecology;  Laterality: Bilateral;   sinus sugrery  1998   TONSILLECTOMY  as child   w/ Adenoidectomy   WRIST SURGERY Right 01/2023   Allergies  Allergen Reactions   Cephalexin Hives    Keflex is hives   Amoxicillin-Pot Clavulanate Rash      Objective:    Physical Exam Vitals and nursing note reviewed.  Constitutional:      Appearance: Normal appearance.  Cardiovascular:     Rate and Rhythm: Normal rate and regular rhythm.  Pulmonary:     Effort: Pulmonary effort is normal.     Breath sounds: Normal breath sounds.  Musculoskeletal:        General: Normal range of motion.  Skin:    General: Skin is warm and dry.  Neurological:     Mental Status: She is alert.  Psychiatric:        Mood and Affect: Mood normal.        Behavior: Behavior normal.    There were no vitals taken for this visit. Wt Readings from Last 3 Encounters:  04/06/23 147 lb 12.8 oz (67 kg)  12/24/22 153 lb 9.6 oz (69.7 kg)  12/06/22 154 lb (69.9 kg)       [name], NP

## 2023-05-11 ENCOUNTER — Encounter: Payer: Self-pay | Admitting: Family

## 2023-05-11 ENCOUNTER — Ambulatory Visit (INDEPENDENT_AMBULATORY_CARE_PROVIDER_SITE_OTHER): Payer: 59 | Admitting: Family

## 2023-05-11 ENCOUNTER — Other Ambulatory Visit (HOSPITAL_COMMUNITY): Payer: Self-pay

## 2023-05-11 DIAGNOSIS — F900 Attention-deficit hyperactivity disorder, predominantly inattentive type: Secondary | ICD-10-CM | POA: Diagnosis not present

## 2023-05-11 MED ORDER — LISDEXAMFETAMINE DIMESYLATE 50 MG PO CAPS
50.0000 mg | ORAL_CAPSULE | Freq: Every day | ORAL | 0 refills | Status: DC
Start: 2023-05-11 — End: 2023-08-01
  Filled 2023-05-11: qty 90, 90d supply, fill #0

## 2023-05-11 NOTE — Assessment & Plan Note (Signed)
chronic, stable taking Vyvanse 50mg  qd - doing well, denies SE, having trouble falling asleep some nights, but does not think r/t vyvanse, takes early in am sending refill for 90 pills f/u in 3 mos- virtual or in-office

## 2023-05-13 ENCOUNTER — Other Ambulatory Visit (HOSPITAL_COMMUNITY): Payer: Self-pay

## 2023-06-02 ENCOUNTER — Other Ambulatory Visit (HOSPITAL_COMMUNITY): Payer: Self-pay

## 2023-06-24 ENCOUNTER — Other Ambulatory Visit: Payer: Self-pay | Admitting: Family

## 2023-06-24 DIAGNOSIS — F411 Generalized anxiety disorder: Secondary | ICD-10-CM

## 2023-06-27 ENCOUNTER — Other Ambulatory Visit: Payer: Self-pay | Admitting: Family

## 2023-06-27 DIAGNOSIS — F411 Generalized anxiety disorder: Secondary | ICD-10-CM

## 2023-06-28 ENCOUNTER — Other Ambulatory Visit: Payer: Self-pay | Admitting: Family

## 2023-06-28 DIAGNOSIS — F411 Generalized anxiety disorder: Secondary | ICD-10-CM

## 2023-06-29 ENCOUNTER — Encounter: Payer: Self-pay | Admitting: Family

## 2023-06-29 ENCOUNTER — Other Ambulatory Visit (HOSPITAL_COMMUNITY): Payer: Self-pay

## 2023-06-29 DIAGNOSIS — F411 Generalized anxiety disorder: Secondary | ICD-10-CM

## 2023-06-29 MED ORDER — SERTRALINE HCL 100 MG PO TABS
100.0000 mg | ORAL_TABLET | Freq: Every day | ORAL | 1 refills | Status: DC
  Filled 2023-06-29: qty 90, 90d supply, fill #0

## 2023-06-30 ENCOUNTER — Other Ambulatory Visit (HOSPITAL_COMMUNITY): Payer: Self-pay

## 2023-07-01 ENCOUNTER — Telehealth: Payer: Self-pay

## 2023-07-01 ENCOUNTER — Other Ambulatory Visit (HOSPITAL_COMMUNITY): Payer: Self-pay

## 2023-07-01 NOTE — Telephone Encounter (Signed)
   It is time to renew the PA for Both Qulipta and Ubrelvy-Insurance is asking the above questions-PT has not been evaluated since starting both meds and I did not see an upcoming appointment scheduled for this PT-Please advise.

## 2023-07-01 NOTE — Telephone Encounter (Signed)
   PT has not been evaluated since starting the med, no upcoming appointment scheduled-It is time to renew their RX for both Qulipta and Ubrelvy-Please advise.

## 2023-07-04 NOTE — Telephone Encounter (Signed)
LVM for pt to call back to discuss qulipta and ubrevly

## 2023-07-04 NOTE — Telephone Encounter (Signed)
I called pt again and she said that the answers are yes to questions. 2-4.  #1 is unknown, but pt states that she has experienced improvement when taking for acute migraine.

## 2023-07-04 NOTE — Telephone Encounter (Signed)
I called pt and she did relay that all the answers  to the questions are yes.

## 2023-07-05 ENCOUNTER — Other Ambulatory Visit (HOSPITAL_COMMUNITY): Payer: Self-pay

## 2023-07-05 NOTE — Telephone Encounter (Signed)
Clinical questions have been submitted-awaiting determination. 

## 2023-07-05 NOTE — Telephone Encounter (Signed)
Pharmacy Patient Advocate Encounter  Received notification from [facility] that Prior Authorization for Qulipta 60MG  tablets has been APPROVED from 07/01/2023 to 07/04/2024   PA #/Case ID/Reference #: PA Case ID #: [id number]

## 2023-07-08 ENCOUNTER — Other Ambulatory Visit (HOSPITAL_COMMUNITY): Payer: Self-pay

## 2023-07-08 NOTE — Telephone Encounter (Signed)
Pharmacy Patient Advocate Encounter  Received notification from [facility] that Prior Authorization for Ubrelvy 100MG  tablets has been APPROVED from 07/07/2023 to 07/05/2024   PA #/Case ID/Reference #: [id number]

## 2023-07-18 ENCOUNTER — Other Ambulatory Visit (HOSPITAL_COMMUNITY): Payer: Self-pay

## 2023-07-27 ENCOUNTER — Other Ambulatory Visit (HOSPITAL_BASED_OUTPATIENT_CLINIC_OR_DEPARTMENT_OTHER): Payer: Self-pay

## 2023-07-27 ENCOUNTER — Ambulatory Visit (INDEPENDENT_AMBULATORY_CARE_PROVIDER_SITE_OTHER): Payer: 59 | Admitting: Family

## 2023-07-27 VITALS — BP 108/74 | HR 79 | Temp 98.0°F | Ht 67.0 in | Wt 152.6 lb

## 2023-07-27 DIAGNOSIS — J208 Acute bronchitis due to other specified organisms: Secondary | ICD-10-CM | POA: Diagnosis not present

## 2023-07-27 DIAGNOSIS — J029 Acute pharyngitis, unspecified: Secondary | ICD-10-CM

## 2023-07-27 DIAGNOSIS — B9689 Other specified bacterial agents as the cause of diseases classified elsewhere: Secondary | ICD-10-CM

## 2023-07-27 LAB — POCT RAPID STREP A (OFFICE): Rapid Strep A Screen: NEGATIVE

## 2023-07-27 MED ORDER — LIDOCAINE VISCOUS HCL 2 % MT SOLN
15.0000 mL | OROMUCOSAL | 0 refills | Status: DC | PRN
  Filled 2023-07-27: qty 100, 10d supply, fill #0

## 2023-07-27 MED ORDER — PREDNISONE 20 MG PO TABS
ORAL_TABLET | ORAL | 0 refills | Status: DC
  Filled 2023-07-27: qty 8, 5d supply, fill #0

## 2023-07-27 MED ORDER — AZITHROMYCIN 250 MG PO TABS
ORAL_TABLET | ORAL | 0 refills | Status: DC
  Filled 2023-07-27: qty 6, 5d supply, fill #0

## 2023-07-27 MED ORDER — ALBUTEROL SULFATE HFA 108 (90 BASE) MCG/ACT IN AERS
INHALATION_SPRAY | RESPIRATORY_TRACT | 1 refills | Status: DC
  Filled 2023-07-27: qty 6.7, 25d supply, fill #0

## 2023-07-27 NOTE — Progress Notes (Signed)
Patient ID: Alison Nichols, female    DOB: 02-22-1986, 37 y.o.   MRN: [id number]  Chief Complaint  Patient presents with   Sinus Problem    Pt c/o Cough with yellow mucus, sore throat and nasal congestion. Has tried OTC cold and flu. Sx for 3 weeks and worsening. Covid negative yesterday.        Discussed the use of AI scribe software for clinical note transcription with the patient, who gave verbal consent to proceed.  History of Present Illness   The patient, with a history of strep throat, presents with a persistent cough and sore throat for the past six days. She reports a low-grade fever ranging from 99 to 100.4 degrees Fahrenheit. The cough is productive with yellowish mucus. She also reports occasional chest tightness, especially during coughing episodes. She has been taking ibuprofen, up to 800mg  at a time, for symptom relief. She also reports sinus congestion, particularly at night, which she believes is contributing to her cough. She does not currently have an albuterol inhaler for symptom management.     Assessment & Plan:  Sore throat - Rapid strep negative.  - POCT rapid strep A - lidocaine (XYLOCAINE) 2 % solution; Use as directed 15 mLs in the mouth or throat every 3 (three) hours as needed for mouth pain (gargle; may spit or swallow).  Dispense: 100 mL; Refill: 0  Upper Respiratory Infection/Bronchitis -  Persistent cough and throat irritation for the past six days with low-grade fever. Yellowish mucus production and post-nasal drip. No significant chest tightness. Physical exam reveals old fluid in the right ear. -Start Azithromycin (Z-Pak) to cover potential bacterial infection. -Start low-dose Prednisone pack to reduce inflammation, to be taken in the morning to avoid sleep disturbance. -Refill Albuterol inhaler for symptomatic relief of cough. -Continue Ibuprofen 600-800mg  tid as needed for throat pain. -Use humidifier & nasal saline inhaler to alleviate dryness and  irritation. -Send Lidocaine rinse for throat pain relief. -RTO precautions provided.     Subjective:    Outpatient Medications Prior to Visit  Medication Sig Dispense Refill   Atogepant (QULIPTA) 60 MG TABS Take 1 tablet (60 mg total) by mouth daily. 30 tablet 11   cetirizine (ZYRTEC) 10 MG tablet Take by mouth.     hydrOXYzine (ATARAX) 25 MG tablet Take 1 tablet by mouth daily. 90 tablet 1   lisdexamfetamine (VYVANSE) 50 MG capsule Take 1 capsule (50 mg total) by mouth daily. 90 capsule 0   methocarbamol (ROBAXIN) 500 MG tablet Take 1 tablet (500 mg total) by mouth every 8 (eight) hours as needed for muscle spasms. 90 tablet 1   ondansetron (ZOFRAN-ODT) 4 MG disintegrating tablet Dissolve 1 - 2 tablets (4 - 8 mg total) in mouth every 8 hours as needed. 30 tablet 11   sertraline (ZOLOFT) 100 MG tablet Take 1 tablet by mouth at bedtime. 90 tablet 1   SUMAtriptan (IMITREX) 100 MG tablet TAKE 1 TABLET BY MOUTH AT ONSET OF HEADACHE. MAY REPEAT IN 2 HOURS IF HEADACHE PERSISTS OR RECURS. NO MORE THAN 2 TABLET IN 24 HOURS 9 tablet 5   tretinoin (RETIN-A) 0.025 % cream Apply to the face in the evening 45 g 3   Ubrogepant (UBRELVY) 100 MG TABS Take 1 tablet (100 mg total) by mouth every 2 (two) hours as needed. Maximum 200mg  a day. 16 tablet 11   albuterol (VENTOLIN HFA) 108 (90 Base) MCG/ACT inhaler INHALE 2 PUFFS BY MOUTH EVERY 6 HOURS AS NEEDED FOR  WHEEZING OR SHORTNESS OF BREATH 18 g 0   No facility-administered medications prior to visit.   Past Medical History:  Diagnosis Date   Acute upper respiratory infection 11/04/2010   Formatting of this note might be different from the original. Upper Respiratory Infection  10/1 IMO update   Adjustment disorder with anxiety 11/06/2010   Formatting of this note might be different from the original. 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   Bilateral  impacted cerumen 10/04/2017   Bullous myringitis of left ear 10/04/2017   Complication of anesthesia    COVID-19 01/2019   scrachy throat x few days all symptoms resolved   Depression    Ehlers-Danlos disease    Fatigue 06/20/2014   Gastroenteritis, non-infectious 06/20/2014   Glycosuria 06/20/2014   Headache    History of gestational hypertension 2019   History of TB skin testing 09/22/2015   History of varicella infection 06/20/2014   IBS (irritable bowel syndrome)    Lower urinary tract infectious disease 06/20/2014   Formatting of this note might be different from the original. IMO Problem List Replacer Jan. 2016 Formatting of this note might be different from the original. Formatting of this note might be different from the original. IMO Problem List Replacer Jan. 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 Adjustment Disorder With Anxiety And Prolonged Depressed Mood   Scoliosis    Vaginal Pap smear, abnormal    Wears glasses 09/22/2015   Past Surgical History:  Procedure Laterality Date   CHOLECYSTECTOMY  2002   COLPOSCOPY  2004   COMBINED HYSTEROSCOPY DIAGNOSTIC / D&C  2004   DILITATION & CURRETTAGE/HYSTROSCOPY WITH HYDROTHERMAL ABLATION N/A 05/29/2020   Procedure: DILATATION & CURETTAGE/HYSTEROSCOPY WITH NOVASURE ABLATION;  Surgeon: Mitchel Honour, DO;  Location: [facility];  Service: Gynecology;  Laterality: N/A;   HYSTEROSCOPY WITH D & C  2004   KNEE ARTHROSCOPY Right 1998 & 1999   LAPAROSCOPIC TUBAL LIGATION Bilateral 05/29/2020   Procedure: BILATERAL LAPAROSCOPIC  TUBAL LIGATION WITH FILSHIE CLIPS;  Surgeon: Mitchel Honour, DO;  Location: [facility];  Service: Gynecology;  Laterality: Bilateral;   sinus sugrery  1998   TONSILLECTOMY  as child   w/ Adenoidectomy   WRIST SURGERY Right 01/2023   Allergies  Allergen Reactions   Cephalexin Hives    Keflex is hives   Amoxicillin-Pot Clavulanate Rash      Objective:    Physical Exam Vitals and nursing note reviewed.  Constitutional:      Appearance: Normal appearance. She is ill-appearing.     Interventions: Face mask in place.  HENT:     Right Ear: Tympanic membrane and ear canal normal.     Left Ear: Tympanic membrane and ear canal normal.     Nose:     Right Sinus: Frontal sinus tenderness present.     Left Sinus: Frontal sinus tenderness present.     Mouth/Throat:     Mouth: Mucous membranes are moist.  Pharynx: Posterior oropharyngeal erythema present. No pharyngeal swelling, oropharyngeal exudate or uvula swelling.     Tonsils: No tonsillar exudate or tonsillar abscesses.  Cardiovascular:     Rate and Rhythm: Normal rate and regular rhythm.  Pulmonary:     Effort: Pulmonary effort is normal.     Breath sounds: Normal breath sounds.  Musculoskeletal:        General: Normal range of motion.  Lymphadenopathy:     Head:     Right side of head: No preauricular or posterior auricular adenopathy.     Left side of head: No preauricular or posterior auricular adenopathy.     Cervical: No cervical adenopathy.  Skin:    General: Skin is warm and dry.  Neurological:     Mental Status: She is alert.  Psychiatric:        Mood and Affect: Mood normal.        Behavior: Behavior normal.    BP 108/74 (BP Location: Left Arm, Patient Position: Sitting, Cuff Size: Normal)   Pulse 79   Temp 98 F (36.7 C) (Temporal)   Ht 5\' 7"  (1.702 m)   Wt 152 lb 9.6 oz (69.2 kg)   SpO2 98%   BMI 23.90 kg/m  Wt Readings from Last 3 Encounters:  07/27/23 152 lb 9.6 oz (69.2 kg)   05/11/23 148 lb 6 oz (67.3 kg)  04/06/23 147 lb 12.8 oz (67 kg)     [name], NP

## 2023-08-01 ENCOUNTER — Encounter: Payer: Self-pay | Admitting: Family

## 2023-08-01 ENCOUNTER — Other Ambulatory Visit (HOSPITAL_COMMUNITY): Payer: Self-pay

## 2023-08-01 ENCOUNTER — Ambulatory Visit (INDEPENDENT_AMBULATORY_CARE_PROVIDER_SITE_OTHER): Payer: 59 | Admitting: Family

## 2023-08-01 VITALS — Temp 98.2°F | Ht 67.0 in | Wt 150.2 lb

## 2023-08-01 DIAGNOSIS — F900 Attention-deficit hyperactivity disorder, predominantly inattentive type: Secondary | ICD-10-CM | POA: Diagnosis not present

## 2023-08-01 DIAGNOSIS — F411 Generalized anxiety disorder: Secondary | ICD-10-CM

## 2023-08-01 MED ORDER — LISDEXAMFETAMINE DIMESYLATE 50 MG PO CAPS
50.0000 mg | ORAL_CAPSULE | Freq: Every day | ORAL | 0 refills | Status: DC
  Filled 2023-08-01 – 2023-08-25 (×3): qty 90, 90d supply, fill #0

## 2023-08-01 MED ORDER — SERTRALINE HCL 100 MG PO TABS
100.0000 mg | ORAL_TABLET | Freq: Every day | ORAL | Status: DC
Start: 2023-08-01 — End: 2023-08-30

## 2023-08-01 NOTE — Assessment & Plan Note (Signed)
chronic, stable taking Vyvanse 50mg  qd sending refill for 90 pills f/u in 3 mos- virtual or in-office

## 2023-08-01 NOTE — Progress Notes (Signed)
Patient ID: Alison Nichols, female    DOB: 01-01-1986, 37 y.o.   MRN: [id number]  Chief Complaint  Patient presents with   Medication Management    Vyvanse, Sertraline refills w/o any concerns   Anxiety   Depression   Asthma   HPI: Anxiety/Depression: Patient complains of anxiety disorder and hx of depression.   She has the following symptoms: none.  Onset of symptoms was approximately  years ago, She denies current suicidal and homicidal ideation.  Possible organic causes contributing are: none.  Risk factors: previous episode of depression  Previous treatment includes Zoloft and Vistaril.  She complains of the following side effects from the treatment: none. ADHD f/u: Medications helping target goals: Vyvanse 50mg  - over a year Regimen: daily, work days mostly Medication side effects/concerns: none Weight: no change Sleep: denies issues Mood changes: denies Tics: denies Blood pressure, Weight, Pulse, Behavior reviewed: wnl    Assessment & Plan:  ADHD, predominantly inattentive type Assessment & Plan: chronic, stable taking Vyvanse 50mg  qd sending refill for 90 pills f/u in 3 mos- virtual or in-office  Orders: -     Lisdexamfetamine Dimesylate; Take 1 capsule (50 mg total) by mouth daily.  Dispense: 90 capsule; Refill: 0  Generalized anxiety disorder Assessment & Plan: chronic stable on Zoloft 100mg  qd sending refill f/u in 6 mos  Orders: -     Sertraline HCl; Take 1 tablet by mouth at bedtime.   Subjective:    Outpatient Medications Prior to Visit  Medication Sig Dispense Refill   albuterol (VENTOLIN HFA) 108 (90 Base) MCG/ACT inhaler INHALE 2 PUFFS BY MOUTH EVERY 6 HOURS AS NEEDED FOR WHEEZING OR SHORTNESS OF BREATH 6.7 g 1   Atogepant (QULIPTA) 60 MG TABS Take 1 tablet (60 mg total) by mouth daily. 30 tablet 11   cetirizine (ZYRTEC) 10 MG tablet Take by mouth.     hydrOXYzine (ATARAX) 25 MG tablet Take 1 tablet by mouth daily. 90 tablet 1    methocarbamol (ROBAXIN) 500 MG tablet Take 1 tablet (500 mg total) by mouth every 8 (eight) hours as needed for muscle spasms. 90 tablet 1   ondansetron (ZOFRAN-ODT) 4 MG disintegrating tablet Dissolve 1 - 2 tablets (4 - 8 mg total) in mouth every 8 hours as needed. 30 tablet 11   SUMAtriptan (IMITREX) 100 MG tablet TAKE 1 TABLET BY MOUTH AT ONSET OF HEADACHE. MAY REPEAT IN 2 HOURS IF HEADACHE PERSISTS OR RECURS. NO MORE THAN 2 TABLET IN 24 HOURS 9 tablet 5   tretinoin (RETIN-A) 0.025 % cream Apply to the face in the evening 45 g 3   Ubrogepant (UBRELVY) 100 MG TABS Take 1 tablet (100 mg total) by mouth every 2 (two) hours as needed. Maximum 200mg  a day. 16 tablet 11   lisdexamfetamine (VYVANSE) 50 MG capsule Take 1 capsule (50 mg total) by mouth daily. 90 capsule 0   sertraline (ZOLOFT) 100 MG tablet Take 1 tablet by mouth at bedtime. 90 tablet 1   azithromycin (ZITHROMAX) 250 MG tablet Take 2 tablets on day 1, then 1 tablet daily on days 2 through 5 (Patient not taking: Reported on 08/01/2023) 6 tablet 0   lidocaine (XYLOCAINE) 2 % solution Use as directed 15 mLs in the mouth or throat every 3 (three) hours as needed for mouth pain (gargle; may spit or swallow). (Patient not taking: Reported on 08/01/2023) 100 mL 0   predniSONE (DELTASONE) 20 MG tablet Take 2 tablets in the morning with breakfast for  3 days, then 1 tablet for 2 days (Patient not taking: Reported on 08/01/2023) 8 tablet 0   No facility-administered medications prior to visit.   Past Medical History:  Diagnosis Date   Acute upper respiratory infection 11/04/2010   Formatting of this note might be different from the original. Upper Respiratory Infection  10/1 IMO update   Adjustment disorder with anxiety 11/06/2010   Formatting of this note might be different from the original. 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   Bilateral  impacted cerumen 10/04/2017   Bullous myringitis of left ear 10/04/2017   Complication of anesthesia    COVID-19 01/2019   scrachy throat x few days all symptoms resolved   Depression    Ehlers-Danlos disease    Fatigue 06/20/2014   Gastroenteritis, non-infectious 06/20/2014   Glycosuria 06/20/2014   Headache    History of gestational hypertension 2019   History of TB skin testing 09/22/2015   History of varicella infection 06/20/2014   IBS (irritable bowel syndrome)    Lower urinary tract infectious disease 06/20/2014   Formatting of this note might be different from the original. IMO Problem List Replacer Jan. 2016 Formatting of this note might be different from the original. Formatting of this note might be different from the original. IMO Problem List Replacer Jan. 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 Adjustment Disorder With Anxiety And Prolonged Depressed Mood   Scoliosis    Vaginal Pap smear, abnormal    Wears glasses 09/22/2015   Past Surgical History:  Procedure Laterality Date   CHOLECYSTECTOMY  2002   COLPOSCOPY  2004   COMBINED HYSTEROSCOPY DIAGNOSTIC / D&C  2004   DILITATION & CURRETTAGE/HYSTROSCOPY WITH HYDROTHERMAL ABLATION N/A 05/29/2020   Procedure: DILATATION & CURETTAGE/HYSTEROSCOPY WITH NOVASURE ABLATION;  Surgeon: Mitchel Honour, DO;  Location: [facility];  Service: Gynecology;  Laterality: N/A;   HYSTEROSCOPY WITH D & C  2004   KNEE ARTHROSCOPY Right 1998 & 1999   LAPAROSCOPIC TUBAL LIGATION Bilateral 05/29/2020   Procedure: BILATERAL LAPAROSCOPIC  TUBAL LIGATION WITH FILSHIE CLIPS;  Surgeon: Mitchel Honour, DO;  Location: [facility];  Service: Gynecology;  Laterality: Bilateral;   sinus sugrery  1998   TONSILLECTOMY  as child   w/ Adenoidectomy   WRIST SURGERY Right 01/2023   Allergies  Allergen Reactions   Cephalexin Hives    Keflex is hives   Amoxicillin-Pot Clavulanate Rash      Objective:    Physical Exam Vitals and nursing note reviewed.  Constitutional:      Appearance: Normal appearance.  Cardiovascular:     Rate and Rhythm: Normal rate and regular rhythm.  Pulmonary:     Effort: Pulmonary effort is normal.     Breath sounds: Normal breath sounds.  Musculoskeletal:        General: Normal range of motion.  Skin:    General: Skin is warm and dry.  Neurological:     Mental Status: She is alert.  Psychiatric:  Mood and Affect: Mood normal.        Behavior: Behavior normal.    Temp 98.2 F (36.8 C)   Ht 5\' 7"  (1.702 m)   Wt 150 lb 3.2 oz (68.1 kg)   BMI 23.52 kg/m  Wt Readings from Last 3 Encounters:  08/01/23 150 lb 3.2 oz (68.1 kg)  07/27/23 152 lb 9.6 oz (69.2 kg)  05/11/23 148 lb 6 oz (67.3 kg)       [name], NP

## 2023-08-01 NOTE — Assessment & Plan Note (Signed)
   chronic  stable on Zoloft 100mg  qd  sending refill  f/u in 6 mos

## 2023-08-25 ENCOUNTER — Encounter (HOSPITAL_COMMUNITY): Payer: Self-pay

## 2023-08-25 ENCOUNTER — Other Ambulatory Visit (HOSPITAL_COMMUNITY): Payer: Self-pay

## 2023-08-29 ENCOUNTER — Other Ambulatory Visit (HOSPITAL_COMMUNITY): Payer: Self-pay

## 2023-08-30 ENCOUNTER — Other Ambulatory Visit: Payer: Self-pay | Admitting: Family

## 2023-08-30 DIAGNOSIS — F411 Generalized anxiety disorder: Secondary | ICD-10-CM

## 2023-08-30 MED ORDER — SERTRALINE HCL 100 MG PO TABS: 100.0000 mg | ORAL_TABLET | Freq: Every day | ORAL | Status: DC

## 2023-08-31 DIAGNOSIS — M5432 Sciatica, left side: Secondary | ICD-10-CM | POA: Diagnosis not present

## 2023-08-31 DIAGNOSIS — M9903 Segmental and somatic dysfunction of lumbar region: Secondary | ICD-10-CM | POA: Diagnosis not present

## 2023-09-08 ENCOUNTER — Other Ambulatory Visit (HOSPITAL_COMMUNITY): Payer: Self-pay

## 2023-09-08 DIAGNOSIS — S46011A Strain of muscle(s) and tendon(s) of the rotator cuff of right shoulder, initial encounter: Secondary | ICD-10-CM | POA: Diagnosis not present

## 2023-09-08 MED ORDER — MELOXICAM 7.5 MG PO TABS
7.5000 mg | ORAL_TABLET | Freq: Two times a day (BID) | ORAL | 0 refills | Status: DC
  Filled 2023-09-08: qty 28, 14d supply, fill #0

## 2023-09-09 ENCOUNTER — Other Ambulatory Visit: Payer: Self-pay | Admitting: Family

## 2023-09-09 DIAGNOSIS — F411 Generalized anxiety disorder: Secondary | ICD-10-CM

## 2023-09-13 ENCOUNTER — Other Ambulatory Visit (HOSPITAL_COMMUNITY): Payer: Self-pay

## 2023-09-15 ENCOUNTER — Other Ambulatory Visit (HOSPITAL_COMMUNITY): Payer: Self-pay

## 2023-09-15 ENCOUNTER — Other Ambulatory Visit: Payer: Self-pay | Admitting: Family

## 2023-09-15 ENCOUNTER — Encounter: Payer: Self-pay | Admitting: Family

## 2023-09-15 ENCOUNTER — Other Ambulatory Visit: Payer: Self-pay

## 2023-09-15 DIAGNOSIS — F411 Generalized anxiety disorder: Secondary | ICD-10-CM

## 2023-09-15 MED ORDER — SERTRALINE HCL 100 MG PO TABS
100.0000 mg | ORAL_TABLET | Freq: Every day | ORAL | 1 refills | Status: DC
  Filled 2023-09-15: qty 90, 90d supply, fill #0
  Filled 2023-12-29: qty 90, 90d supply, fill #1

## 2023-09-16 ENCOUNTER — Other Ambulatory Visit (HOSPITAL_COMMUNITY): Payer: Self-pay

## 2023-09-20 DIAGNOSIS — N760 Acute vaginitis: Secondary | ICD-10-CM | POA: Diagnosis not present

## 2023-09-20 DIAGNOSIS — Z1151 Encounter for screening for human papillomavirus (HPV): Secondary | ICD-10-CM | POA: Diagnosis not present

## 2023-09-20 DIAGNOSIS — Z6824 Body mass index (BMI) 24.0-24.9, adult: Secondary | ICD-10-CM | POA: Diagnosis not present

## 2023-09-20 DIAGNOSIS — Z01419 Encounter for gynecological examination (general) (routine) without abnormal findings: Secondary | ICD-10-CM | POA: Diagnosis not present

## 2023-09-20 DIAGNOSIS — Z124 Encounter for screening for malignant neoplasm of cervix: Secondary | ICD-10-CM | POA: Diagnosis not present

## 2023-09-23 ENCOUNTER — Other Ambulatory Visit: Payer: Self-pay

## 2023-09-23 ENCOUNTER — Encounter: Payer: Self-pay | Admitting: Pharmacist

## 2023-09-28 ENCOUNTER — Other Ambulatory Visit: Payer: Self-pay

## 2023-10-02 ENCOUNTER — Telehealth: Payer: 59 | Admitting: Family

## 2023-10-02 DIAGNOSIS — J019 Acute sinusitis, unspecified: Secondary | ICD-10-CM | POA: Diagnosis not present

## 2023-10-02 MED ORDER — DOXYCYCLINE HYCLATE 100 MG PO TABS: 100.0000 mg | ORAL_TABLET | Freq: Two times a day (BID) | ORAL | 0 refills | Status: DC

## 2023-10-02 NOTE — Progress Notes (Signed)
E-Visit for Sinus Problems  We are sorry that you are not feeling well.  Here is how we plan to help!  Based on what you have shared with me it looks like you have sinusitis.  Sinusitis is inflammation and infection in the sinus cavities of the head.  Based on your presentation I believe you most likely have Acute Bacterial Sinusitis.  This is an infection caused by bacteria and is treated with antibiotics. I have prescribed Doxycycline 100mg by mouth twice a day for 10 days. You may use an oral decongestant such as Mucinex D or if you have glaucoma or high blood pressure use plain Mucinex. Saline nasal spray help and can safely be used as often as needed for congestion.  If you develop worsening sinus pain, fever or notice severe headache and vision changes, or if symptoms are not better after completion of antibiotic, please schedule an appointment with a health care provider.    Sinus infections are not as easily transmitted as other respiratory infection, however we still recommend that you avoid close contact with loved ones, especially the very young and elderly.  Remember to wash your hands thoroughly throughout the day as this is the number one way to prevent the spread of infection!  Home Care: Only take medications as instructed by your medical team. Complete the entire course of an antibiotic. Do not take these medications with alcohol. A steam or ultrasonic humidifier can help congestion.  You can place a towel over your head and breathe in the steam from hot water coming from a faucet. Avoid close contacts especially the very young and the elderly. Cover your mouth when you cough or sneeze. Always remember to wash your hands.  Get Help Right Away If: You develop worsening fever or sinus pain. You develop a severe head ache or visual changes. Your symptoms persist after you have completed your treatment plan.  Make sure you Understand these instructions. Will watch your  condition. Will get help right away if you are not doing well or get worse.  Thank you for choosing an e-visit.  Your e-visit answers were reviewed by a board certified advanced clinical practitioner to complete your personal care plan. Depending upon the condition, your plan could have included both over the counter or prescription medications.  Please review your pharmacy choice. Make sure the pharmacy is open so you can pick up prescription now. If there is a problem, you may contact your provider through MyChart messaging and have the prescription routed to another pharmacy.  Your safety is important to us. If you have drug allergies check your prescription carefully.   For the next 24 hours you can use MyChart to ask questions about today's visit, request a non-urgent call back, or ask for a work or school excuse. You will get an email in the next two days asking about your experience. I hope that your e-visit has been valuable and will speed your recovery.   Approximately 5 minutes was spent documenting and reviewing patient's chart.   

## 2023-10-05 ENCOUNTER — Other Ambulatory Visit (HOSPITAL_COMMUNITY): Payer: Self-pay

## 2023-10-05 ENCOUNTER — Encounter: Payer: Self-pay | Admitting: Family

## 2023-10-05 ENCOUNTER — Ambulatory Visit: Payer: 59 | Admitting: Family

## 2023-10-05 VITALS — BP 128/81 | HR 74 | Temp 97.3°F | Ht 67.0 in | Wt 153.4 lb

## 2023-10-05 DIAGNOSIS — M9903 Segmental and somatic dysfunction of lumbar region: Secondary | ICD-10-CM | POA: Diagnosis not present

## 2023-10-05 DIAGNOSIS — J0110 Acute frontal sinusitis, unspecified: Secondary | ICD-10-CM

## 2023-10-05 DIAGNOSIS — M5432 Sciatica, left side: Secondary | ICD-10-CM | POA: Diagnosis not present

## 2023-10-05 MED ORDER — AZITHROMYCIN 250 MG PO TABS
ORAL_TABLET | ORAL | 0 refills | Status: AC
  Filled 2023-10-05: qty 6, 5d supply, fill #0

## 2023-10-05 NOTE — Progress Notes (Signed)
Patient ID: Alison Nichols, female    DOB: Dec 29, 1985, 38 y.o.   MRN: [id number]  Chief Complaint  Patient presents with   Sinus Problem    sx for 1-2 weeks       HPI: Sinus sx:     Pt c/o nasal congestion, slight cough and mouth ulcers, present for almost 2 weeks, as her children have also been ill,  but her sx worsened Friday. Pt reports doing an evisit over the w/e and given DOXY, but this does not work for her.  Negative for Covid and flu at home on Friday. Has tried mucinex, advil cold/sinus, flonase and zyrtec with minimal relief.  Assessment & Plan:  1. Acute non-recurrent frontal sinusitis (Primary) sending Zpack as DOXY does not work for her sinusitis in past. Advised on use & SE. Recommend saline nasal spray tid for disinfection, increase water intake, Ibuprofen up to 600mg  for pain relief/fever, continue Flonase nasal spray to relieve sinus sx. Can use OTC Peroxyl mouth rinse bid for mouth ulcers, and ambesol gel prn.  - azithromycin (ZITHROMAX) 250 MG tablet; Take 2 tablets on day 1, then 1 tablet daily on days 2 through 5  Dispense: 6 tablet; Refill: 0  Subjective:    Outpatient Medications Prior to Visit  Medication Sig Dispense Refill   albuterol (VENTOLIN HFA) 108 (90 Base) MCG/ACT inhaler INHALE 2 PUFFS BY MOUTH EVERY 6 HOURS AS NEEDED FOR WHEEZING OR SHORTNESS OF BREATH 6.7 g 1   Atogepant (QULIPTA) 60 MG TABS Take 1 tablet (60 mg total) by mouth daily. 30 tablet 11   cetirizine (ZYRTEC) 10 MG tablet Take by mouth.     meloxicam (MOBIC) 7.5 MG tablet Take 1 tablet (7.5 mg total) by mouth 2 (two) times daily for 14 days. 28 tablet 0   methocarbamol (ROBAXIN) 500 MG tablet Take 1 tablet (500 mg total) by mouth every 8 (eight) hours as needed for muscle spasms. 90 tablet 1   ondansetron (ZOFRAN-ODT) 4 MG disintegrating tablet Dissolve 1 - 2 tablets (4 - 8 mg total) in mouth every 8 hours as needed. 30 tablet 11   sertraline (ZOLOFT) 100 MG tablet Take 1 tablet by mouth  at bedtime. 30 tablet    sertraline (ZOLOFT) 100 MG tablet Take 1 tablet by mouth at bedtime. 90 tablet 1   SUMAtriptan (IMITREX) 100 MG tablet TAKE 1 TABLET BY MOUTH AT ONSET OF HEADACHE. MAY REPEAT IN 2 HOURS IF HEADACHE PERSISTS OR RECURS. NO MORE THAN 2 TABLET IN 24 HOURS 9 tablet 5   Ubrogepant (UBRELVY) 100 MG TABS Take 1 tablet (100 mg total) by mouth every 2 (two) hours as needed. Maximum 200mg  a day. 16 tablet 11   doxycycline (VIBRA-TABS) 100 MG tablet Take 1 tablet (100 mg total) by mouth 2 (two) times daily. (Patient not taking: Reported on 10/05/2023) 20 tablet 0   hydrOXYzine (ATARAX) 25 MG tablet Take 1 tablet by mouth daily. 90 tablet 1   lisdexamfetamine (VYVANSE) 50 MG capsule Take 1 capsule (50 mg total) by mouth daily. 90 capsule 0   tretinoin (RETIN-A) 0.025 % cream Apply to the face in the evening 45 g 3   No facility-administered medications prior to visit.   Past Medical History:  Diagnosis Date   Acute upper respiratory infection 11/04/2010   Formatting of this note might be different from the original. Upper Respiratory Infection  10/1 IMO update   Adjustment disorder with anxiety 11/06/2010   Formatting of this note  might be different from the original. 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   Bilateral impacted cerumen 10/04/2017   Bullous myringitis of left ear 10/04/2017   Complication of anesthesia    COVID-19 01/2019   scrachy throat x few days all symptoms resolved   Depression    Ehlers-Danlos disease    Fatigue 06/20/2014   Gastroenteritis, non-infectious 06/20/2014   Glycosuria 06/20/2014   Headache    History of gestational hypertension 2019   History of TB skin testing 09/22/2015   History of varicella infection 06/20/2014   IBS (irritable bowel syndrome)    Lower urinary tract infectious disease 06/20/2014   Formatting of this note might be  different from the original. IMO Problem List Replacer Jan. 2016 Formatting of this note might be different from the original. Formatting of this note might be different from the original. IMO Problem List Replacer Jan. 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 Adjustment Disorder With Anxiety And Prolonged Depressed Mood   Scoliosis    Vaginal Pap smear, abnormal    Wears glasses 09/22/2015   Past Surgical History:  Procedure Laterality Date   CHOLECYSTECTOMY  2002   COLPOSCOPY  2004   COMBINED HYSTEROSCOPY DIAGNOSTIC / D&C  2004   DILITATION & CURRETTAGE/HYSTROSCOPY WITH HYDROTHERMAL ABLATION N/A 05/29/2020   Procedure: DILATATION & CURETTAGE/HYSTEROSCOPY WITH NOVASURE ABLATION;  Surgeon: Mitchel Honour, DO;  Location: [facility];  Service: Gynecology;  Laterality: N/A;   HYSTEROSCOPY WITH D & C  2004   KNEE ARTHROSCOPY Right 1998 & 1999   LAPAROSCOPIC TUBAL LIGATION Bilateral 05/29/2020   Procedure: BILATERAL LAPAROSCOPIC TUBAL LIGATION WITH FILSHIE CLIPS;  Surgeon: Mitchel Honour, DO;  Location: [facility];  Service: Gynecology;  Laterality: Bilateral;   sinus sugrery  1998   TONSILLECTOMY  as child   w/ Adenoidectomy   WRIST SURGERY Right 01/2023   Allergies  Allergen Reactions   Cephalexin Hives    Keflex is hives   Amoxicillin-Pot Clavulanate Rash      Objective:    Physical Exam Vitals and nursing note reviewed.  Constitutional:      Appearance: Normal appearance. She is ill-appearing.     Interventions: Face mask in place.  HENT:     Right Ear:  Tympanic membrane and ear canal normal.     Left Ear: Tympanic membrane and ear canal normal.     Nose:     Right Sinus: Frontal sinus tenderness present.     Left Sinus: Frontal sinus tenderness present.     Mouth/Throat:     Mouth: Mucous membranes are moist.     Pharynx: No pharyngeal swelling, oropharyngeal exudate, posterior oropharyngeal erythema or uvula swelling.     Tonsils: No tonsillar exudate or tonsillar abscesses.  Cardiovascular:     Rate and Rhythm: Normal rate and regular rhythm.  Pulmonary:     Effort: Pulmonary effort is normal.     Breath sounds: Normal breath sounds.  Musculoskeletal:  General: Normal range of motion.  Lymphadenopathy:     Head:     Right side of head: No preauricular or posterior auricular adenopathy.     Left side of head: No preauricular or posterior auricular adenopathy.     Cervical: No cervical adenopathy.  Skin:    General: Skin is warm and dry.  Neurological:     Mental Status: She is alert.  Psychiatric:        Mood and Affect: Mood normal.        Behavior: Behavior normal.    BP 128/81 (BP Location: Left Arm, Patient Position: Sitting, Cuff Size: Normal)   Pulse 74   Temp (!) 97.3 F (36.3 C) (Temporal)   Ht 5\' 7"  (1.702 m)   Wt 153 lb 6.4 oz (69.6 kg)   SpO2 100%   BMI 24.03 kg/m  Wt Readings from Last 3 Encounters:  10/05/23 153 lb 6.4 oz (69.6 kg)  08/01/23 150 lb 3.2 oz (68.1 kg)  07/27/23 152 lb 9.6 oz (69.2 kg)      [name], NP

## 2023-10-13 DIAGNOSIS — Z3202 Encounter for pregnancy test, result negative: Secondary | ICD-10-CM | POA: Diagnosis not present

## 2023-10-13 DIAGNOSIS — R87810 Cervical high risk human papillomavirus (HPV) DNA test positive: Secondary | ICD-10-CM | POA: Diagnosis not present

## 2023-10-31 DIAGNOSIS — M5432 Sciatica, left side: Secondary | ICD-10-CM | POA: Diagnosis not present

## 2023-10-31 DIAGNOSIS — M9903 Segmental and somatic dysfunction of lumbar region: Secondary | ICD-10-CM | POA: Diagnosis not present

## 2023-11-01 ENCOUNTER — Other Ambulatory Visit: Payer: Self-pay

## 2023-11-01 ENCOUNTER — Other Ambulatory Visit (HOSPITAL_COMMUNITY): Payer: Self-pay

## 2023-11-01 MED ORDER — TOPIRAMATE 25 MG PO TABS
25.0000 mg | ORAL_TABLET | Freq: Two times a day (BID) | ORAL | 3 refills | Status: DC
  Filled 2023-11-01: qty 60, 30d supply, fill #0

## 2023-11-01 MED ORDER — TOPIRAMATE 50 MG PO TABS
50.0000 mg | ORAL_TABLET | Freq: Two times a day (BID) | ORAL | 3 refills | Status: DC
  Filled 2023-11-29 – 2023-12-14 (×2): qty 60, 30d supply, fill #0
  Filled 2024-01-30: qty 60, 30d supply, fill #1
  Filled 2024-03-26: qty 60, 30d supply, fill #2
  Filled 2024-06-06: qty 60, 30d supply, fill #3

## 2023-11-21 DIAGNOSIS — M9903 Segmental and somatic dysfunction of lumbar region: Secondary | ICD-10-CM | POA: Diagnosis not present

## 2023-11-21 DIAGNOSIS — M5432 Sciatica, left side: Secondary | ICD-10-CM | POA: Diagnosis not present

## 2023-11-29 ENCOUNTER — Other Ambulatory Visit: Payer: Self-pay | Admitting: Nurse Practitioner

## 2023-11-29 ENCOUNTER — Telehealth: Admitting: Nurse Practitioner

## 2023-11-29 DIAGNOSIS — J029 Acute pharyngitis, unspecified: Secondary | ICD-10-CM

## 2023-11-29 DIAGNOSIS — R0982 Postnasal drip: Secondary | ICD-10-CM | POA: Diagnosis not present

## 2023-11-29 DIAGNOSIS — K122 Cellulitis and abscess of mouth: Secondary | ICD-10-CM

## 2023-11-29 MED ORDER — FLUTICASONE PROPIONATE 50 MCG/ACT NA SUSP
2.0000 | Freq: Every day | NASAL | 6 refills | Status: DC
  Filled 2023-11-29: qty 16, 30d supply, fill #0

## 2023-11-29 MED ORDER — AZITHROMYCIN 250 MG PO TABS
ORAL_TABLET | ORAL | 0 refills | Status: AC
  Filled 2023-11-29: qty 6, 5d supply, fill #0

## 2023-11-29 MED ORDER — LIDOCAINE VISCOUS HCL 2 % MT SOLN
5.0000 mL | Freq: Four times a day (QID) | OROMUCOSAL | 0 refills | Status: DC | PRN
  Filled 2023-11-29: qty 360, 18d supply, fill #0

## 2023-11-29 MED ORDER — CETIRIZINE HCL 10 MG PO TABS
10.0000 mg | ORAL_TABLET | Freq: Every day | ORAL | 3 refills | Status: AC
  Filled 2023-11-29: qty 30, 30d supply, fill #0

## 2023-11-29 NOTE — Progress Notes (Signed)
 E-Visit for Sore Throat  We are sorry that you are not feeling well.  Here is how we plan to help!  It appears that you have post nasal drainage, I can see that in the picture you uploaded thank you.  This may be from allergies or from a virus similar to what your daughter had.   If you are not already we recommend starting your allergy medicine including Zyrtec and flonase we have called in refills to your pharmacy to assure you have them available.   Your symptoms indicate a likely viral infection (Pharyngitis).   Pharyngitis is inflammation in the back of the throat which can cause a sore throat, scratchiness and sometimes difficulty swallowing.   Pharyngitis is typically caused by a respiratory virus and will just run its course.  Please keep in mind that your symptoms could last up to 10 days.  For throat pain, we recommend over the counter oral pain relief medications such as acetaminophen or aspirin, or anti-inflammatory medications such as ibuprofen or naproxen sodium.  Topical treatments such as oral throat lozenges or sprays may be used as needed.  Avoid close contact with loved ones, especially the very young and elderly.  Remember to wash your hands thoroughly throughout the day as this is the number one way to prevent the spread of infection and wipe down door knobs and counters with disinfectant.    After careful review of your answers, I would not recommend an antibiotic for your condition.  Antibiotics should not be used to treat conditions that we suspect are caused by viruses like the virus that causes the common cold or flu. However, some people can have Strep with atypical symptoms. You may need formal testing in clinic or office to confirm if your symptoms continue or worsen.  Providers prescribe antibiotics to treat infections caused by bacteria. Antibiotics are very powerful in treating bacterial infections when they are used properly.  To maintain their effectiveness, they  should be used only when necessary.  Overuse of antibiotics has resulted in the development of super bugs that are resistant to treatment!    Home Care: Only take medications as instructed by your medical team. Do not drink alcohol while taking these medications. A steam or ultrasonic humidifier can help congestion.  You can place a towel over your head and breathe in the steam from hot water coming from a faucet. Avoid close contacts especially the very young and the elderly. Cover your mouth when you cough or sneeze. Always remember to wash your hands.  Get Help Right Away If: You develop worsening fever or throat pain. You develop a severe head ache or visual changes. Your symptoms persist after you have completed your treatment plan.  Make sure you Understand these instructions. Will watch your condition. Will get help right away if you are not doing well or get worse.   Thank you for choosing an e-visit.  Your e-visit answers were reviewed by a board certified advanced clinical practitioner to complete your personal care plan. Depending upon the condition, your plan could have included both over the counter or prescription medications.  Please review your pharmacy choice. Make sure the pharmacy is open so you can pick up prescription now. If there is a problem, you may contact your provider through [employer] and have the prescription routed to another pharmacy.  Your safety is important to [place]. If you have drug allergies check your prescription carefully.   For the next 24 hours you  can use MyChart to ask questions about today's visit, request a non-urgent call back, or ask for a work or school excuse. You will get an email in the next two days asking about your experience. I hope that your e-visit has been valuable and will speed your recovery.   I spent approximately 5 minutes reviewing the patient's history, current symptoms and coordinating their care today.

## 2023-11-30 ENCOUNTER — Other Ambulatory Visit (HOSPITAL_BASED_OUTPATIENT_CLINIC_OR_DEPARTMENT_OTHER): Payer: Self-pay

## 2023-11-30 ENCOUNTER — Other Ambulatory Visit (HOSPITAL_COMMUNITY): Payer: Self-pay

## 2023-11-30 ENCOUNTER — Other Ambulatory Visit: Payer: Self-pay

## 2023-12-01 ENCOUNTER — Encounter (HOSPITAL_COMMUNITY): Payer: Self-pay

## 2023-12-01 ENCOUNTER — Ambulatory Visit
Admission: EM | Admit: 2023-12-01 | Discharge: 2023-12-01 | Disposition: A | Discharge status: Home / Self Care | Attending: Family Medicine | Admitting: Family Medicine

## 2023-12-01 ENCOUNTER — Other Ambulatory Visit (HOSPITAL_BASED_OUTPATIENT_CLINIC_OR_DEPARTMENT_OTHER): Payer: Self-pay

## 2023-12-01 ENCOUNTER — Other Ambulatory Visit (HOSPITAL_COMMUNITY): Payer: Self-pay

## 2023-12-01 ENCOUNTER — Other Ambulatory Visit: Payer: Self-pay

## 2023-12-01 DIAGNOSIS — J4521 Mild intermittent asthma with (acute) exacerbation: Secondary | ICD-10-CM | POA: Diagnosis not present

## 2023-12-01 DIAGNOSIS — B349 Viral infection, unspecified: Secondary | ICD-10-CM | POA: Insufficient documentation

## 2023-12-01 DIAGNOSIS — J029 Acute pharyngitis, unspecified: Secondary | ICD-10-CM | POA: Diagnosis not present

## 2023-12-01 LAB — POCT RAPID STREP A (OFFICE): Rapid Strep A Screen: NEGATIVE

## 2023-12-01 LAB — POC SARS CORONAVIRUS 2 AG -  ED: SARS Coronavirus 2 Ag: NEGATIVE

## 2023-12-01 MED ORDER — ALBUTEROL SULFATE HFA 108 (90 BASE) MCG/ACT IN AERS
1.0000 | INHALATION_SPRAY | Freq: Four times a day (QID) | RESPIRATORY_TRACT | 0 refills | Status: AC | PRN
  Filled 2023-12-01: qty 6.7, 25d supply, fill #0

## 2023-12-01 MED ORDER — PREDNISONE 20 MG PO TABS
40.0000 mg | ORAL_TABLET | Freq: Every day | ORAL | 0 refills | Status: AC
Start: 2023-12-01 — End: 2023-12-06
  Filled 2023-12-01: qty 10, 5d supply, fill #0

## 2023-12-01 NOTE — ED Triage Notes (Signed)
 Pt present with a sore throat and cough x Sunday. Pt has also had nasal congestion and felt SOB. Has taken Zyrtec, used Flonase and Ibuprofen for pain. Pt has also used throat lozenges that have not helped.

## 2023-12-01 NOTE — Discharge Instructions (Addendum)
 You have tested negative for COVID and strep throat.  I have refilled your albuterol inhaler to use as needed for wheezing or shortness of breath.  Start prednisone daily for 5 days that will help with throat pain as well as your asthma symptoms.  Continue the Flonase and allergy medicine as previously prescribed.  Lots of rest and fluids.  Please follow-up with your PCP in 2 to 3 days for recheck.  Please go to the ER if you develop any worsening symptoms.  I hope you feel better soon!

## 2023-12-01 NOTE — ED Provider Notes (Addendum)
 [facility]    CSN: [id number] Arrival date & time: 12/01/23  1008      History   Chief Complaint Chief Complaint  Patient presents with   Cough   Sore Throat    HPI Alison Nichols is a 38 y.o. female  presents for evaluation of URI symptoms for 5 days. Patient reports associated symptoms of cough, congestion, sore throat, shortness of breath. Denies N/V/D, fevers, chills, ear pain, body aches. Patient does have a hx of asthma.  Does not have a inhaler currently.  Patient is not an active smoker.   Reports daughter has similar symptoms.  She did do an e-visit on April 8 for same symptoms.  She was prescribed Flonase and cetirizine as well as azithromycin and Magic mouthwash.  She states she has not filled azithromycin but has been using the other treatments with minimal improvement.  Pt has taken pain OTC for symptoms. Pt has no other concerns at this time.     Cough Associated symptoms: shortness of breath and sore throat   Sore Throat Associated symptoms include shortness of breath.    Past Medical History:  Diagnosis Date   Acute upper respiratory infection 11/04/2010   Formatting of this note might be different from the original. Upper Respiratory Infection  10/1 IMO update   Adjustment disorder with anxiety 11/06/2010   Formatting of this note might be different from the original. 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   Bilateral impacted cerumen 10/04/2017   Bullous myringitis of left ear 10/04/2017   Complication of anesthesia    COVID-19 01/2019   scrachy throat x few days all symptoms resolved   Depression    Ehlers-Danlos disease    Fatigue 06/20/2014   Gastroenteritis, non-infectious 06/20/2014   Glycosuria 06/20/2014   Headache    History of gestational hypertension 2019   History of TB skin testing 09/22/2015   History of varicella  infection 06/20/2014   IBS (irritable bowel syndrome)    Lower urinary tract infectious disease 06/20/2014   Formatting of this note might be different from the original. IMO Problem List Replacer Jan. 2016 Formatting of this note might be different from the original. Formatting of this note might be different from the original. IMO Problem List Replacer Jan. 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   Generalized anxiety disorder 11/25/2021   Dysautonomia (HCC) 07/15/2020   Vitamin D deficiency 07/30/2019   ADHD, predominantly inattentive type 04/07/2018   Lumbar degenerative disc disease 06/20/2014   Allergic rhinitis 06/16/2010   Irritable bowel syndrome 03/08/2008   Ehlers-Danlos syndrome, unspecified 03/08/2008   Chronic migraine without aura without status migrainosus, not intractable 01/27/2008    Past Surgical History:  Procedure  Laterality Date   CHOLECYSTECTOMY  2002   COLPOSCOPY  2004   COMBINED HYSTEROSCOPY DIAGNOSTIC / D&C  2004   DILITATION & CURRETTAGE/HYSTROSCOPY WITH HYDROTHERMAL ABLATION N/A 05/29/2020   Procedure: DILATATION & CURETTAGE/HYSTEROSCOPY WITH NOVASURE ABLATION;  Surgeon: Mitchel Honour, DO;  Location: [facility];  Service: Gynecology;  Laterality: N/A;   HYSTEROSCOPY WITH D & C  2004   KNEE  ARTHROSCOPY Right 1998 & 1999   LAPAROSCOPIC TUBAL LIGATION Bilateral 05/29/2020   Procedure: BILATERAL LAPAROSCOPIC TUBAL LIGATION WITH FILSHIE CLIPS;  Surgeon: Mitchel Honour, DO;  Location: [facility];  Service: Gynecology;  Laterality: Bilateral;   sinus sugrery  1998   TONSILLECTOMY  as child   w/ Adenoidectomy   WRIST SURGERY Right 01/2023    OB History     Gravida  5   Para  3   Term  1   Preterm  2   AB  2   Living  3      SAB  2   IAB      Ectopic      Multiple  0   Live Births  3            Home Medications    Prior to Admission medications   Medication Sig Start Date End Date Taking? Authorizing Provider  albuterol (VENTOLIN HFA) 108 (90 Base) MCG/ACT inhaler Inhale 1-2 puffs into the lungs every 6 (six) hours as needed. 12/01/23  Yes Radford Pax, NP  predniSONE (DELTASONE) 20 MG tablet Take 2 tablets (40 mg total) by mouth daily with breakfast for 5 days. 12/01/23 12/06/23 Yes Radford Pax, NP  Atogepant (QULIPTA) 60 MG TABS Take 1 tablet (60 mg total) by mouth daily. 12/27/22   Anson Fret, MD  azithromycin (ZITHROMAX) 250 MG tablet Take 2 tablets on day 1, then 1 tablet daily on days 2 through 5 11/29/23 12/05/23  Viviano Simas, FNP  cetirizine (ZYRTEC ALLERGY) 10 MG tablet Take 1 tablet (10 mg total) by mouth at bedtime. 11/29/23 03/28/24  Viviano Simas, FNP  cetirizine (ZYRTEC) 10 MG tablet Take by mouth. 10/04/17   [provider]  fluticasone (FLONASE) 50 MCG/ACT nasal spray Place 2 sprays into both nostrils daily. 11/29/23   Viviano Simas, FNP  magic mouthwash (lidocaine, diphenhydrAMINE, alum & mag hydroxide) suspension Swish and spit 5 mLs 4 (four) times daily as needed for mouth pain. 11/29/23   Viviano Simas, FNP  meloxicam (MOBIC) 7.5 MG tablet Take 1 tablet (7.5 mg total) by mouth 2 (two) times daily for 14 days. 09/08/23     methocarbamol (ROBAXIN) 500 MG tablet Take 1 tablet (500 mg total) by mouth every 8 (eight) hours as  needed for muscle spasms. 05/20/22   Anson Fret, MD  ondansetron (ZOFRAN-ODT) 4 MG disintegrating tablet Dissolve 1 - 2 tablets (4 - 8 mg total) in mouth every 8 hours as needed. 02/02/23   Anson Fret, MD  sertraline (ZOLOFT) 100 MG tablet Take 1 tablet by mouth at bedtime. 08/30/23   [name], NP  sertraline (ZOLOFT) 100 MG tablet Take 1 tablet by mouth at bedtime. 09/15/23   [name], NP  SUMAtriptan (IMITREX) 100 MG tablet TAKE 1 TABLET BY MOUTH AT ONSET OF HEADACHE. MAY REPEAT IN 2 HOURS IF HEADACHE PERSISTS OR RECURS. NO MORE THAN 2 TABLET IN 24 HOURS 12/22/22   Anson Fret, MD  topiramate (TOPAMAX) 25 MG tablet Take one tablet by mouth in the  morning and evening or as directed 10/31/23     topiramate (TOPAMAX) 50 MG tablet Take 1 tablet (50 mg total) by mouth 2 (two) times daily  in the morning and evening or as directed 11/14/23     Ubrogepant (UBRELVY) 100 MG TABS Take 1 tablet (100 mg total) by mouth every 2 (two) hours as needed. Maximum 200mg  a day. 12/22/22   Anson Fret, MD    Family History Family History  Problem Relation Age of Onset   Heart failure Maternal Grandmother    Hypertension Maternal Grandmother    Thyroid disease Maternal Grandmother    Arthritis Maternal Grandmother    Heart attack Maternal Grandmother    CAD Maternal Grandmother    Migraines Maternal Grandmother    Depression Mother    Hyperlipidemia Mother    Hypertension Mother    Migraines Mother    Hypertension Maternal Grandfather    Kidney disease Maternal Grandfather    Epilepsy Maternal Grandfather     Social History Social History   Tobacco Use   Smoking status: Never    Passive exposure: Never   Smokeless tobacco: Never  Vaping Use   Vaping status: Never Used  Substance Use Topics   Alcohol use: Never   Drug use: Never     Allergies   Cephalexin and Amoxicillin-pot clavulanate   Review of Systems Review of Systems  HENT:  Positive for congestion  and sore throat.   Respiratory:  Positive for cough and shortness of breath.      Physical Exam Triage Vital Signs ED Triage Vitals [12/01/23 1013]  Encounter Vitals Group     BP 109/77     Systolic BP Percentile      Diastolic BP Percentile      Pulse Rate 71     Resp 17     Temp 98.2 F (36.8 C)     Temp Source Oral     SpO2 98 %     Weight      Height      Head Circumference      Peak Flow      Pain Score 7     Pain Loc      Pain Education      Exclude from Growth Chart    No data found.  Updated Vital Signs BP 109/77 (BP Location: Left Arm)   Pulse 71   Temp 98.2 F (36.8 C) (Oral)   Resp 17   SpO2 98%   Visual Acuity Right Eye Distance:   Left Eye Distance:   Bilateral Distance:    Right Eye Near:   Left Eye Near:    Bilateral Near:     Physical Exam Vitals and nursing note reviewed.  Constitutional:      General: She is not in acute distress.    Appearance: She is well-developed. She is not ill-appearing.  HENT:     Head: Normocephalic and atraumatic.     Right Ear: Tympanic membrane and ear canal normal.     Left Ear: Tympanic membrane and ear canal normal.     Nose: Congestion present.     Mouth/Throat:     Mouth: Mucous membranes are moist.     Pharynx: Oropharynx is clear. Uvula midline. Posterior oropharyngeal erythema present.     Tonsils: No tonsillar exudate or tonsillar abscesses.  Eyes:     Conjunctiva/sclera: Conjunctivae normal.     Pupils: Pupils are equal, round, and reactive to light.  Cardiovascular:  Rate and Rhythm: Normal rate and regular rhythm.     Heart sounds: Normal heart sounds.  Pulmonary:     Effort: Pulmonary effort is normal.     Breath sounds: Normal breath sounds. No wheezing or rhonchi.  Musculoskeletal:     Cervical back: Normal range of motion and neck supple.  Lymphadenopathy:     Cervical: No cervical adenopathy.  Skin:    General: Skin is warm and dry.  Neurological:     General: No focal deficit  present.     Mental Status: She is alert and oriented to person, place, and time.  Psychiatric:        Mood and Affect: Mood normal.        Behavior: Behavior normal.      UC Treatments / Results  Labs (all labs ordered are listed, but only abnormal results are displayed) Labs Reviewed  CULTURE, GROUP A STREP [facility])  POCT RAPID STREP A (OFFICE)  POC SARS CORONAVIRUS 2 AG -  ED    EKG   Radiology No results found.  Procedures Procedures (including critical care time)  Medications Ordered in UC Medications - No data to display  Initial Impression / Assessment and Plan / UC Course  I have reviewed the triage vital signs and the nursing notes.  Pertinent labs & imaging results that were available during my care of the patient were reviewed by me and considered in my medical decision making (see chart for details).     Reviewed exam and symptoms with patient.  No red flags.  Negative rapid strep and COVID testing.  Will send strep throat culture and contact for any positive results.  Discussed viral illness/asthma exacerbation and symptomatic treatment.  Refilled albuterol inhaler and will start prednisone daily for 5 days.  Patient to continue Flonase and allergy medicine as previously prescribed.  She declines cough medicine wheeze OTC treatments if needed.  Discussed rest fluids and PCP follow-up 2 to 3 days for recheck.  ER precautions reviewed and patient verbalized understanding. Final Clinical Impressions(s) / UC Diagnoses   Final diagnoses:  Viral illness  Mild intermittent asthma with acute exacerbation  Sore throat     Discharge Instructions      You have tested negative for COVID and strep throat.  I have refilled your albuterol inhaler to use as needed for wheezing or shortness of breath.  Start prednisone daily for 5 days that will help with throat pain as well as your asthma symptoms.  Continue the Flonase and allergy medicine as previously prescribed.  Lots  of rest and fluids.  Please follow-up with your PCP in 2 to 3 days for recheck.  Please go to the ER if you develop any worsening symptoms.  I hope you feel better soon!     ED Prescriptions     Medication Sig Dispense Auth. Provider   albuterol (VENTOLIN HFA) 108 (90 Base) MCG/ACT inhaler Inhale 1-2 puffs into the lungs every 6 (six) hours as needed. 6.7 g Radford Pax, NP   predniSONE (DELTASONE) 20 MG tablet Take 2 tablets (40 mg total) by mouth daily with breakfast for 5 days. 10 tablet Radford Pax, NP      PDMP not reviewed this encounter.   Radford Pax, NP 12/01/23 1118    Radford Pax, NP 12/01/23 [phone]

## 2023-12-02 ENCOUNTER — Other Ambulatory Visit (HOSPITAL_COMMUNITY): Payer: Self-pay

## 2023-12-04 LAB — CULTURE, GROUP A STREP (THRC)

## 2023-12-05 DIAGNOSIS — M6289 Other specified disorders of muscle: Secondary | ICD-10-CM | POA: Diagnosis not present

## 2023-12-05 DIAGNOSIS — N3946 Mixed incontinence: Secondary | ICD-10-CM | POA: Diagnosis not present

## 2023-12-05 DIAGNOSIS — M6281 Muscle weakness (generalized): Secondary | ICD-10-CM | POA: Diagnosis not present

## 2023-12-05 DIAGNOSIS — R151 Fecal smearing: Secondary | ICD-10-CM | POA: Diagnosis not present

## 2023-12-05 DIAGNOSIS — N941 Unspecified dyspareunia: Secondary | ICD-10-CM | POA: Diagnosis not present

## 2023-12-05 DIAGNOSIS — K5900 Constipation, unspecified: Secondary | ICD-10-CM | POA: Diagnosis not present

## 2023-12-08 ENCOUNTER — Ambulatory Visit (INDEPENDENT_AMBULATORY_CARE_PROVIDER_SITE_OTHER): Admitting: Family

## 2023-12-08 ENCOUNTER — Other Ambulatory Visit (HOSPITAL_BASED_OUTPATIENT_CLINIC_OR_DEPARTMENT_OTHER): Payer: Self-pay

## 2023-12-08 VITALS — BP 105/73 | HR 80 | Temp 97.3°F | Ht 67.0 in | Wt 166.6 lb

## 2023-12-08 DIAGNOSIS — J208 Acute bronchitis due to other specified organisms: Secondary | ICD-10-CM

## 2023-12-08 DIAGNOSIS — H65193 Other acute nonsuppurative otitis media, bilateral: Secondary | ICD-10-CM

## 2023-12-08 MED ORDER — AMOXICILLIN 500 MG PO CAPS
1000.0000 mg | ORAL_CAPSULE | Freq: Three times a day (TID) | ORAL | 0 refills | Status: DC
  Filled 2023-12-08: qty 30, 5d supply, fill #0

## 2023-12-08 MED ORDER — PREDNISONE 20 MG PO TABS
ORAL_TABLET | ORAL | 0 refills | Status: AC
  Filled 2023-12-08: qty 8, 5d supply, fill #0

## 2023-12-08 NOTE — Progress Notes (Signed)
 Patient ID: EARLY ORD, female    DOB: 06-Apr-1986, 38 y.o.   MRN: [id number]  Chief Complaint  Patient presents with   Cough    Pt c/o cough, sore throat and fatigue , present for 2 weeks. Has tried z- pack and prednisone which did not help sx.   Discussed the use of AI scribe software for clinical note transcription with the patient, who gave verbal consent to proceed.  History of Present Illness The patient, with a history of childhood asthma and frequent bronchitis, presents with a persistent cough and shortness of breath. She describes the sputum as 'greenish yellow.' She also reports difficulty taking deep breaths. She has been using an albuterol inhaler, which provides some relief. She suspects that her symptoms may be due to allergies or a recent illness contracted from her children. She takes Flonase and Zyrtec daily for allergies, which have been effective until this recent episode.  The patient also reports ear pain and a sore throat, which began prior to the cough. She describes the throat pain as severe, likening it to 'swallowing glass.' Despite the severity of the pain, the throat does not appear red. She was previously prescribed prednisone, which alleviated the throat pain but did not improve the cough. The cough has worsened since she finished the prednisone course.  Assessment & Plan Acute Bronchitis Persistent cough, dyspnea, and greenish-yellow sputum. Asthma and history of frequent bronchitis. Albuterol provides relief. Azithromycin ineffective. Lungs ok on exam. - Prescribe prednisone 20 mg, take two tablets in the morning for a few days, then taper to one tablet. - Drink at least 2L water daily - Advise using saline spray for pollen-related symptoms. - Recommend wearing a mask and limiting outdoor exposure to reduce pollen inhalation. - Call office next week if no relief  Sore Throat Severe sore throat, not visibly erythematous, tonsillectomized. Prednisone  previously alleviated symptoms. - Sending prednisone for bronchitis, may help throat symptoms. - Advise hydration and use of ibuprofen or Aleve for pain management after completing prednisone course. - Call office next week if no relief  Otitis media bilaterally, mild - Ear discomfort with dull tympanic membranes. Possible ear infection. Amoxicillin considered due to effectiveness and tolerance. - Prescribe amoxicillin 1g tid x 5d, advised on use & SE - Prednisone will help with pain - Call office next week if no relief  Allergic Rhinitis Worsening sinus symptoms likely due to pollen exposure. Uses Flonase and Zyrtec daily. - Continue Flonase and Zyrtec as prescribed. - Use saline spray to alleviate nasal congestion and flush out allergens.      Subjective:    Outpatient Medications Prior to Visit  Medication Sig Dispense Refill   albuterol (VENTOLIN HFA) 108 (90 Base) MCG/ACT inhaler Inhale 1-2 puffs into the lungs every 6 (six) hours as needed. 6.7 g 0   Atogepant (QULIPTA) 60 MG TABS Take 1 tablet (60 mg total) by mouth daily. 30 tablet 11   cetirizine (ZYRTEC) 10 MG tablet Take by mouth.     magic mouthwash (lidocaine, diphenhydrAMINE, alum & mag hydroxide) suspension Swish and spit 5 mLs 4 (four) times daily as needed for mouth pain. 360 mL 0   methocarbamol (ROBAXIN) 500 MG tablet Take 1 tablet (500 mg total) by mouth every 8 (eight) hours as needed for muscle spasms. 90 tablet 1   ondansetron (ZOFRAN-ODT) 4 MG disintegrating tablet Dissolve 1 - 2 tablets (4 - 8 mg total) in mouth every 8 hours as needed. 30 tablet 11  sertraline (ZOLOFT) 100 MG tablet Take 1 tablet by mouth at bedtime. 30 tablet    sertraline (ZOLOFT) 100 MG tablet Take 1 tablet by mouth at bedtime. 90 tablet 1   SUMAtriptan (IMITREX) 100 MG tablet TAKE 1 TABLET BY MOUTH AT ONSET OF HEADACHE. MAY REPEAT IN 2 HOURS IF HEADACHE PERSISTS OR RECURS. NO MORE THAN 2 TABLET IN 24 HOURS 9 tablet 5   topiramate  (TOPAMAX) 50 MG tablet Take 1 tablet (50 mg total) by mouth 2 (two) times daily  in the morning and evening or as directed 60 tablet 3   Ubrogepant (UBRELVY) 100 MG TABS Take 1 tablet (100 mg total) by mouth every 2 (two) hours as needed. Maximum 200mg  a day. 16 tablet 11   cetirizine (ZYRTEC ALLERGY) 10 MG tablet Take 1 tablet (10 mg total) by mouth at bedtime. 30 tablet 3   fluticasone (FLONASE) 50 MCG/ACT nasal spray Place 2 sprays into both nostrils daily. 16 g 6   meloxicam (MOBIC) 7.5 MG tablet Take 1 tablet (7.5 mg total) by mouth 2 (two) times daily for 14 days. 28 tablet 0   topiramate (TOPAMAX) 25 MG tablet Take one tablet by mouth in the morning and evening or as directed 60 tablet 3   No facility-administered medications prior to visit.   Past Medical History:  Diagnosis Date   Acute upper respiratory infection 11/04/2010   Formatting of this note might be different from the original. Upper Respiratory Infection  10/1 IMO update   Adjustment disorder with anxiety 11/06/2010   Formatting of this note might be different from the original. 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   Bilateral impacted cerumen 10/04/2017   Bullous myringitis of left ear 10/04/2017   Complication of anesthesia    COVID-19 01/2019   scrachy throat x few days all symptoms resolved   Depression    Ehlers-Danlos disease    Fatigue 06/20/2014   Gastroenteritis, non-infectious 06/20/2014   Glycosuria 06/20/2014   Headache    History of gestational hypertension 2019   History of TB skin testing 09/22/2015   History of varicella infection 06/20/2014   IBS (irritable bowel syndrome)    Lower urinary tract infectious disease 06/20/2014   Formatting of this note might be different from the original. IMO Problem List Replacer Jan. 2016 Formatting of this note might be different from the original. Formatting  of this note might be different from the original. IMO Problem List Replacer Jan. 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 Adjustment Disorder With Anxiety And Prolonged Depressed Mood   Scoliosis    Vaginal Pap smear, abnormal    Wears glasses 09/22/2015   Past Surgical History:  Procedure Laterality Date   CHOLECYSTECTOMY  2002   COLPOSCOPY  2004   COMBINED HYSTEROSCOPY DIAGNOSTIC / D&C  2004   DILITATION & CURRETTAGE/HYSTROSCOPY WITH HYDROTHERMAL ABLATION N/A 05/29/2020   Procedure: DILATATION & CURETTAGE/HYSTEROSCOPY WITH NOVASURE ABLATION;  Surgeon: Mitchel Honour, DO;  Location: [facility];  Service: Gynecology;  Laterality: N/A;   HYSTEROSCOPY WITH D & C  2004   KNEE ARTHROSCOPY Right 1998 & 1999   LAPAROSCOPIC TUBAL LIGATION Bilateral 05/29/2020   Procedure: BILATERAL LAPAROSCOPIC TUBAL LIGATION WITH FILSHIE CLIPS;  Surgeon: Dyanna Glasgow, DO;  Location: [facility];  Service: Gynecology;  Laterality: Bilateral;   sinus sugrery  1998   TONSILLECTOMY  as child   w/ Adenoidectomy   WRIST SURGERY Right 01/2023   Allergies  Allergen Reactions   Cephalexin Hives    Keflex is hives   Amoxicillin-Pot Clavulanate Rash      Objective:    Physical Exam Vitals and nursing note reviewed.  Constitutional:      Appearance: Normal appearance.  HENT:     Right Ear: Tenderness present. A middle ear effusion is present. Tympanic membrane is not erythematous or bulging.     Left Ear: Tenderness present. A middle ear effusion is present. Tympanic membrane is not  erythematous or bulging.     Mouth/Throat:     Pharynx: Posterior oropharyngeal erythema present. No pharyngeal swelling, oropharyngeal exudate, uvula swelling or postnasal drip.     Tonsils: 0 on the right. 0 on the left.  Cardiovascular:     Rate and Rhythm: Normal rate and regular rhythm.  Pulmonary:     Effort: Pulmonary effort is normal.     Breath sounds: Examination of the right-upper field reveals rhonchi. Rhonchi (mild) present.  Musculoskeletal:        General: Normal range of motion.  Skin:    General: Skin is warm and dry.  Neurological:     Mental Status: She is alert.  Psychiatric:        Mood and Affect: Mood normal.        Behavior: Behavior normal.    BP 105/73 (BP Location: Left Arm, Patient Position: Sitting, Cuff Size: Large)   Pulse 80   Temp (!) 97.3 F (36.3 C) (Temporal)   Ht 5\' 7"  (1.702 m)   Wt 166 lb 9.6 oz (75.6 kg)   SpO2 96%   BMI 26.09 kg/m  Wt Readings from Last 3 Encounters:  12/08/23 166 lb 9.6 oz (75.6 kg)  10/05/23 153 lb 6.4 oz (69.6 kg)  08/01/23 150 lb 3.2 oz (68.1 kg)      Versa Gore, NP

## 2023-12-09 ENCOUNTER — Other Ambulatory Visit (HOSPITAL_COMMUNITY): Payer: Self-pay

## 2023-12-09 ENCOUNTER — Other Ambulatory Visit (HOSPITAL_BASED_OUTPATIENT_CLINIC_OR_DEPARTMENT_OTHER): Payer: Self-pay

## 2023-12-12 ENCOUNTER — Encounter: Payer: Self-pay | Admitting: Family

## 2023-12-12 DIAGNOSIS — J4521 Mild intermittent asthma with (acute) exacerbation: Secondary | ICD-10-CM

## 2023-12-12 DIAGNOSIS — J208 Acute bronchitis due to other specified organisms: Secondary | ICD-10-CM

## 2023-12-13 MED ORDER — BENZONATATE 200 MG PO CAPS
200.0000 mg | ORAL_CAPSULE | Freq: Three times a day (TID) | ORAL | 0 refills | Status: AC | PRN
  Filled 2023-12-13: qty 30, 10d supply, fill #0

## 2023-12-13 MED ORDER — BUDESONIDE-FORMOTEROL FUMARATE 80-4.5 MCG/ACT IN AERO
2.0000 | INHALATION_SPRAY | Freq: Two times a day (BID) | RESPIRATORY_TRACT | 3 refills | Status: DC
  Filled 2023-12-13: qty 10.2, 30d supply, fill #0

## 2023-12-13 NOTE — Addendum Note (Signed)
 Addended by: Jaccob Czaplicki on: 12/13/2023 09:02 PM   Modules accepted: Orders

## 2023-12-14 ENCOUNTER — Other Ambulatory Visit: Payer: Self-pay

## 2023-12-14 ENCOUNTER — Other Ambulatory Visit (HOSPITAL_COMMUNITY): Payer: Self-pay

## 2023-12-19 ENCOUNTER — Other Ambulatory Visit (HOSPITAL_COMMUNITY): Payer: Self-pay

## 2023-12-28 ENCOUNTER — Encounter: Payer: Self-pay | Admitting: Family

## 2023-12-28 ENCOUNTER — Ambulatory Visit: Admitting: Family

## 2023-12-28 ENCOUNTER — Other Ambulatory Visit (HOSPITAL_COMMUNITY): Payer: Self-pay

## 2023-12-28 VITALS — BP 118/77 | HR 83 | Temp 97.7°F | Ht 67.0 in | Wt 163.2 lb

## 2023-12-28 DIAGNOSIS — F900 Attention-deficit hyperactivity disorder, predominantly inattentive type: Secondary | ICD-10-CM

## 2023-12-28 DIAGNOSIS — M9903 Segmental and somatic dysfunction of lumbar region: Secondary | ICD-10-CM | POA: Diagnosis not present

## 2023-12-28 DIAGNOSIS — F411 Generalized anxiety disorder: Secondary | ICD-10-CM

## 2023-12-28 DIAGNOSIS — M5432 Sciatica, left side: Secondary | ICD-10-CM | POA: Diagnosis not present

## 2023-12-28 MED ORDER — LISDEXAMFETAMINE DIMESYLATE 50 MG PO CAPS
50.0000 mg | ORAL_CAPSULE | Freq: Every day | ORAL | 0 refills | Status: DC
  Filled 2023-12-28: qty 90, 90d supply, fill #0

## 2023-12-28 NOTE — Assessment & Plan Note (Signed)
 Taking Vyvanse  50mg  qd. Had to meet her $500 deductible before med could be covered. Now copay is reasonable. Doing well on dose, no SE. - Sending refill for Vyvanse  50mg  90 pill supply. - F/U in 3 mos, virtual ok

## 2023-12-28 NOTE — Progress Notes (Signed)
 Patient ID: Alison Nichols, female    DOB: 11-Oct-1985, 38 y.o.   MRN: [id number]  Chief Complaint  Patient presents with   ADHD  Discussed the use of AI scribe software for clinical note transcription with the patient, who gave verbal consent to proceed.  History of Present Illness Alison Nichols is a 38 year old female who presents for ADHD follow up visit. She also is experiencing a persistent, slightly productive cough that worsens at night when lying down. Symbicort  has provided some improvement, and she has added omeprazole for concurrent heartburn. The cough is constant and bothersome, especially at work. Allergy symptoms have been significant over the past two weeks, with her eyes matted shut upon waking. She typically experiences high allergies but not this specific symptom. She attributes this to a high pollen count. Current medications include a low dose of Symbicort , Flonase , and Nasacort , with no side effects. Lozenges are used for throat irritation, which has subsided. No wheezing is present. Heartburn symptoms are noted.  Assessment & Plan ADHD - Taking Vyvanse  50mg  qd. Had to meet her $500 deductible before med could be covered. Now copay is reasonable. Doing well on dose, no SE. - Sending refill for Vyvanse  50mg  90 pill supply. - F/U in 3 mos, virtual ok  Cough and allergies Persistent nocturnal cough likely due to allergies, with possible viral overlap. Reflux may contribute to symptoms. Lungs clear on exam. - Continue Symbicort , consider reducing to one puff per day if symptoms allow. - Use Flonase  or Nasacort , one squirt each nostril per day. - Use lozenges for throat tickle. - Continue omeprazole for reflux management.   Subjective:    Outpatient Medications Prior to Visit  Medication Sig Dispense Refill   albuterol  (VENTOLIN  HFA) 108 (90 Base) MCG/ACT inhaler Inhale 1-2 puffs into the lungs every 6 (six) hours as needed. 6.7 g 0   Atogepant  (QULIPTA ) 60  MG TABS Take 1 tablet (60 mg total) by mouth daily. 30 tablet 11   budesonide -formoterol  (SYMBICORT ) 80-4.5 MCG/ACT inhaler Inhale 2 puffs into the lungs 2 (two) times daily. 10.2 g 3   cetirizine  (ZYRTEC  ALLERGY) 10 MG tablet Take 1 tablet (10 mg total) by mouth at bedtime. 30 tablet 3   fluticasone  (FLONASE ) 50 MCG/ACT nasal spray Place 2 sprays into both nostrils daily. 16 g 6   methocarbamol  (ROBAXIN ) 500 MG tablet Take 1 tablet (500 mg total) by mouth every 8 (eight) hours as needed for muscle spasms. 90 tablet 1   ondansetron  (ZOFRAN -ODT) 4 MG disintegrating tablet Dissolve 1 - 2 tablets (4 - 8 mg total) in mouth every 8 hours as needed. 30 tablet 11   sertraline  (ZOLOFT ) 100 MG tablet Take 1 tablet by mouth at bedtime. 90 tablet 1   SUMAtriptan  (IMITREX ) 100 MG tablet TAKE 1 TABLET BY MOUTH AT ONSET OF HEADACHE. MAY REPEAT IN 2 HOURS IF HEADACHE PERSISTS OR RECURS. NO MORE THAN 2 TABLET IN 24 HOURS 9 tablet 5   topiramate  (TOPAMAX ) 50 MG tablet Take 1 tablet (50 mg total) by mouth 2 (two) times daily  in the morning and evening or as directed 60 tablet 3   Ubrogepant  (UBRELVY ) 100 MG TABS Take 1 tablet (100 mg total) by mouth every 2 (two) hours as needed. Maximum 200mg  a day. 16 tablet 11   lisdexamfetamine (VYVANSE ) 50 MG capsule      amoxicillin  (AMOXIL ) 500 MG capsule Take 2 capsules (1,000 mg total) by mouth 3 (three) times daily. 30  capsule 0   magic mouthwash (lidocaine , diphenhydrAMINE , alum & mag hydroxide) suspension Swish and spit 5 mLs 4 (four) times daily as needed for mouth pain. 360 mL 0   No facility-administered medications prior to visit.   Past Medical History:  Diagnosis Date   Acute upper respiratory infection 11/04/2010   Formatting of this note might be different from the original. Upper Respiratory Infection  10/1 IMO update   Adjustment disorder with anxiety 11/06/2010   Formatting of this note might be different from the original. 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   Bilateral impacted cerumen 10/04/2017   Bullous myringitis of left ear 10/04/2017   Complication of anesthesia    COVID-19 01/2019   scrachy throat x few days all symptoms resolved   Depression    Ehlers-Danlos disease    Fatigue 06/20/2014   Gastroenteritis, non-infectious 06/20/2014   Glycosuria 06/20/2014   Headache    History of gestational hypertension 2019   History of TB skin testing 09/22/2015   History of varicella infection 06/20/2014   IBS (irritable bowel syndrome)    Lower urinary tract infectious disease 06/20/2014   Formatting of this note might be different from the original. IMO Problem List Replacer Jan. 2016 Formatting of this note might be different from the original. Formatting of this note might be different from the original. IMO Problem List Replacer Jan. 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 Adjustment Disorder With Anxiety And Prolonged Depressed Mood   Scoliosis    Vaginal Pap smear, abnormal    Wears glasses 09/22/2015   Past Surgical History:  Procedure Laterality Date   CHOLECYSTECTOMY  2002   COLPOSCOPY  2004   COMBINED HYSTEROSCOPY DIAGNOSTIC / D&C  2004   DILITATION & CURRETTAGE/HYSTROSCOPY WITH HYDROTHERMAL ABLATION N/A 05/29/2020   Procedure: DILATATION & CURETTAGE/HYSTEROSCOPY WITH NOVASURE ABLATION;  Surgeon: Dyanna Glasgow, DO;  Location: [facility];  Service: Gynecology;  Laterality: N/A;   HYSTEROSCOPY WITH D & C  2004    KNEE ARTHROSCOPY Right 1998 & 1999   LAPAROSCOPIC TUBAL LIGATION Bilateral 05/29/2020   Procedure: BILATERAL LAPAROSCOPIC TUBAL LIGATION WITH FILSHIE CLIPS;  Surgeon: Dyanna Glasgow, DO;  Location: [facility];  Service: Gynecology;  Laterality: Bilateral;   sinus sugrery  1998   TONSILLECTOMY  as child   w/ Adenoidectomy   WRIST SURGERY Right 01/2023   Allergies  Allergen Reactions   Cephalexin Hives    Keflex is hives   Amoxicillin -Pot Clavulanate Rash      Objective:    Physical Exam Vitals and nursing note reviewed.  Constitutional:      Appearance: Normal appearance.  Cardiovascular:     Rate and Rhythm: Normal rate and regular rhythm.  Pulmonary:     Effort: Pulmonary effort is normal.     Breath sounds: Normal breath sounds.  Musculoskeletal:        General: Normal range of motion.  Skin:    General: Skin is warm and dry.  Neurological:     Mental Status: She is alert.  Psychiatric:        Mood and Affect: Mood normal.        Behavior: Behavior normal.    BP 118/77 (BP Location: Left Arm, Patient Position: Sitting, Cuff Size: Large)   Pulse 83   Temp 97.7 F (36.5 C) (Temporal)   Ht 5\' 7"  (1.702 m)   Wt 163 lb 4 oz (74 kg)   SpO2 99%   BMI 25.57 kg/m  Wt Readings from Last 3 Encounters:  12/28/23 163 lb 4 oz (74 kg)  12/08/23 166 lb 9.6 oz (75.6 kg)  10/05/23 153 lb 6.4 oz (69.6 kg)       Versa Gore, NP

## 2023-12-29 ENCOUNTER — Other Ambulatory Visit: Payer: Self-pay | Admitting: Neurology

## 2023-12-29 DIAGNOSIS — G43009 Migraine without aura, not intractable, without status migrainosus: Secondary | ICD-10-CM

## 2023-12-30 ENCOUNTER — Other Ambulatory Visit (HOSPITAL_COMMUNITY): Payer: Self-pay

## 2024-01-02 ENCOUNTER — Other Ambulatory Visit (HOSPITAL_COMMUNITY): Payer: Self-pay

## 2024-01-02 MED ORDER — QULIPTA 60 MG PO TABS
60.0000 mg | ORAL_TABLET | Freq: Every day | ORAL | 11 refills | Status: AC
  Filled 2024-01-02: qty 30, 30d supply, fill #0
  Filled 2024-01-30: qty 30, 30d supply, fill #1
  Filled 2024-03-07: qty 30, 30d supply, fill #2
  Filled 2024-04-03: qty 30, 30d supply, fill #3
  Filled 2024-05-06: qty 30, 30d supply, fill #4
  Filled 2024-06-06: qty 30, 30d supply, fill #5
  Filled 2024-07-03: qty 30, 30d supply, fill #6
  Filled 2024-07-30: qty 30, 30d supply, fill #7
  Filled 2024-08-31: qty 30, 30d supply, fill #8
  Filled 2024-09-25: qty 90, 90d supply, fill #9

## 2024-01-02 NOTE — Telephone Encounter (Signed)
 ok to refill as before, thx - migraines

## 2024-01-05 DIAGNOSIS — N941 Unspecified dyspareunia: Secondary | ICD-10-CM | POA: Diagnosis not present

## 2024-01-05 DIAGNOSIS — M6289 Other specified disorders of muscle: Secondary | ICD-10-CM | POA: Diagnosis not present

## 2024-01-05 DIAGNOSIS — N3946 Mixed incontinence: Secondary | ICD-10-CM | POA: Diagnosis not present

## 2024-01-05 DIAGNOSIS — R151 Fecal smearing: Secondary | ICD-10-CM | POA: Diagnosis not present

## 2024-01-05 DIAGNOSIS — M6281 Muscle weakness (generalized): Secondary | ICD-10-CM | POA: Diagnosis not present

## 2024-01-05 DIAGNOSIS — K5900 Constipation, unspecified: Secondary | ICD-10-CM | POA: Diagnosis not present

## 2024-02-08 DIAGNOSIS — M9903 Segmental and somatic dysfunction of lumbar region: Secondary | ICD-10-CM | POA: Diagnosis not present

## 2024-02-08 DIAGNOSIS — M5432 Sciatica, left side: Secondary | ICD-10-CM | POA: Diagnosis not present

## 2024-03-07 DIAGNOSIS — M9903 Segmental and somatic dysfunction of lumbar region: Secondary | ICD-10-CM | POA: Diagnosis not present

## 2024-03-07 DIAGNOSIS — M5432 Sciatica, left side: Secondary | ICD-10-CM | POA: Diagnosis not present

## 2024-03-15 ENCOUNTER — Other Ambulatory Visit (HOSPITAL_COMMUNITY): Payer: Self-pay

## 2024-03-15 DIAGNOSIS — M25512 Pain in left shoulder: Secondary | ICD-10-CM | POA: Diagnosis not present

## 2024-03-15 MED ORDER — MELOXICAM 15 MG PO TABS
15.0000 mg | ORAL_TABLET | Freq: Every day | ORAL | 0 refills | Status: DC
  Filled 2024-03-15: qty 10, 10d supply, fill #0

## 2024-03-16 ENCOUNTER — Other Ambulatory Visit (HOSPITAL_BASED_OUTPATIENT_CLINIC_OR_DEPARTMENT_OTHER): Payer: Self-pay

## 2024-03-17 ENCOUNTER — Other Ambulatory Visit (HOSPITAL_COMMUNITY): Payer: Self-pay

## 2024-03-21 DIAGNOSIS — M25512 Pain in left shoulder: Secondary | ICD-10-CM | POA: Diagnosis not present

## 2024-03-22 ENCOUNTER — Other Ambulatory Visit (HOSPITAL_COMMUNITY): Payer: Self-pay

## 2024-03-22 MED ORDER — MELOXICAM 7.5 MG PO TABS
7.5000 mg | ORAL_TABLET | Freq: Two times a day (BID) | ORAL | 0 refills | Status: DC
  Filled 2024-03-22: qty 28, 14d supply, fill #0

## 2024-03-26 ENCOUNTER — Other Ambulatory Visit: Payer: Self-pay | Admitting: Family

## 2024-03-26 ENCOUNTER — Other Ambulatory Visit (HOSPITAL_BASED_OUTPATIENT_CLINIC_OR_DEPARTMENT_OTHER): Payer: Self-pay

## 2024-03-26 ENCOUNTER — Other Ambulatory Visit: Payer: Self-pay

## 2024-03-26 DIAGNOSIS — F411 Generalized anxiety disorder: Secondary | ICD-10-CM

## 2024-03-26 MED ORDER — SERTRALINE HCL 100 MG PO TABS
100.0000 mg | ORAL_TABLET | Freq: Every day | ORAL | 1 refills | Status: DC
  Filled 2024-03-26 (×2): qty 90, 90d supply, fill #0
  Filled 2024-06-06 – 2024-06-28 (×2): qty 90, 90d supply, fill #1

## 2024-06-06 ENCOUNTER — Other Ambulatory Visit: Payer: Self-pay

## 2024-06-06 ENCOUNTER — Other Ambulatory Visit: Payer: Self-pay | Admitting: Medical Genetics

## 2024-06-06 ENCOUNTER — Other Ambulatory Visit (HOSPITAL_BASED_OUTPATIENT_CLINIC_OR_DEPARTMENT_OTHER): Payer: Self-pay

## 2024-06-06 DIAGNOSIS — Z006 Encounter for examination for normal comparison and control in clinical research program: Secondary | ICD-10-CM

## 2024-06-07 ENCOUNTER — Ambulatory Visit: Admitting: Family

## 2024-06-07 ENCOUNTER — Other Ambulatory Visit (HOSPITAL_BASED_OUTPATIENT_CLINIC_OR_DEPARTMENT_OTHER): Payer: Self-pay

## 2024-06-07 DIAGNOSIS — F900 Attention-deficit hyperactivity disorder, predominantly inattentive type: Secondary | ICD-10-CM | POA: Diagnosis not present

## 2024-06-07 MED ORDER — LISDEXAMFETAMINE DIMESYLATE 50 MG PO CAPS
50.0000 mg | ORAL_CAPSULE | Freq: Every day | ORAL | 0 refills | Status: AC
  Filled 2024-06-07: qty 90, 90d supply, fill #0

## 2024-06-07 NOTE — Progress Notes (Signed)
 Patient ID: Alison Nichols, female    DOB: 14-Apr-1986, 38 y.o.   MRN: [id number]  Chief Complaint  Patient presents with   ADHD  Discussed the use of AI scribe software for clinical note transcription with the patient, who gave verbal consent to proceed.  History of Present Illness Alison Nichols is a 38 year old female who presents for a follow-up on Vyvanse  use.  She takes Vyvanse  50 mg most days with no side effects. The medication is effective and well-tolerated.  Assessment & Plan Attention-deficit hyperactivity disorder, predominantly inattentive type ADHD well-managed with Vyvanse  50 mg daily, no side effects reported. - Continue Vyvanse  50 mg daily, sending refill for 90 pills, no RF. - F/U in 3 mos or prior to when next refill needed, virtual visit ok    Subjective:    Outpatient Medications Prior to Visit  Medication Sig Dispense Refill   albuterol  (VENTOLIN  HFA) 108 (90 Base) MCG/ACT inhaler Inhale 1-2 puffs into the lungs every 6 (six) hours as needed. 6.7 g 0   Atogepant  (QULIPTA ) 60 MG TABS Take 1 tablet (60 mg total) by mouth daily. 30 tablet 11   cetirizine  (ZYRTEC  ALLERGY) 10 MG tablet Take 1 tablet (10 mg total) by mouth at bedtime. 30 tablet 3   fluticasone  (FLONASE ) 50 MCG/ACT nasal spray Place 2 sprays into both nostrils daily. 16 g 6   lisdexamfetamine (VYVANSE ) 50 MG capsule Take 1 capsule (50 mg total) by mouth daily. 90 capsule 0   methocarbamol  (ROBAXIN ) 500 MG tablet Take 1 tablet (500 mg total) by mouth every 8 (eight) hours as needed for muscle spasms. 90 tablet 1   ondansetron  (ZOFRAN -ODT) 4 MG disintegrating tablet Dissolve 1 - 2 tablets (4 - 8 mg total) in mouth every 8 hours as needed. 30 tablet 11   sertraline  (ZOLOFT ) 100 MG tablet Take 1 tablet by mouth at bedtime. 90 tablet 1   SUMAtriptan  (IMITREX ) 100 MG tablet TAKE 1 TABLET BY MOUTH AT ONSET OF HEADACHE. MAY REPEAT IN 2 HOURS IF HEADACHE PERSISTS OR RECURS. NO MORE THAN 2 TABLET IN 24  HOURS 9 tablet 5   topiramate  (TOPAMAX ) 50 MG tablet Take 1 tablet (50 mg total) by mouth 2 (two) times daily  in the morning and evening or as directed 60 tablet 3   Ubrogepant  (UBRELVY ) 100 MG TABS Take 1 tablet (100 mg total) by mouth every 2 (two) hours as needed. Maximum 200mg  a day. 16 tablet 11   budesonide -formoterol  (SYMBICORT ) 80-4.5 MCG/ACT inhaler Inhale 2 puffs into the lungs 2 (two) times daily. 10.2 g 3   meloxicam  (MOBIC ) 15 MG tablet Take 1 tablet (15 mg total) by mouth daily. 10 tablet 0   meloxicam  (MOBIC ) 7.5 MG tablet Take 1 tablet (7.5 mg total) by mouth 2 (two) times daily with meals for 14 days for pain and inflammation 28 tablet 0   No facility-administered medications prior to visit.   Past Medical History:  Diagnosis Date   Acute upper respiratory infection 11/04/2010   Formatting of this note might be different from the original. Upper Respiratory Infection  10/1 IMO update   Adjustment disorder with anxiety 11/06/2010   Formatting of this note might be different from the original. 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 IMO Problem List Replacer Jan. 2016 Formatting of this note might be different from the original. Formatting of this note might be different from the  original. IMO Problem List Replacer Jan. 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 Adjustment Disorder With Anxiety And Prolonged Depressed Mood   Scoliosis    Vaginal Pap smear, abnormal    Wears glasses 09/22/2015   Past Surgical History:  Procedure Laterality Date   CHOLECYSTECTOMY  2002   COLPOSCOPY  2004   COMBINED HYSTEROSCOPY DIAGNOSTIC / D&C  2004   DILITATION & CURRETTAGE/HYSTROSCOPY WITH HYDROTHERMAL ABLATION N/A 05/29/2020   Procedure: DILATATION & CURETTAGE/HYSTEROSCOPY WITH NOVASURE ABLATION;  Surgeon: Dannielle Bouchard, DO;  Location: [facility];  Service: Gynecology;  Laterality: N/A;   HYSTEROSCOPY WITH D & C  2004   KNEE ARTHROSCOPY Right 1998 & 1999   LAPAROSCOPIC TUBAL LIGATION Bilateral 05/29/2020   Procedure: BILATERAL LAPAROSCOPIC TUBAL LIGATION WITH FILSHIE CLIPS;  Surgeon: Dannielle Bouchard, DO;  Location: [facility];  Service: Gynecology;  Laterality: Bilateral;   sinus sugrery  1998   TONSILLECTOMY  as child   w/ Adenoidectomy   WRIST SURGERY Right 01/2023   Allergies  Allergen Reactions   Cephalexin Hives    Keflex is hives   Amoxicillin -Pot Clavulanate Rash      Objective:    Physical Exam Vitals and nursing note reviewed.  Constitutional:      Appearance: Normal appearance.  Cardiovascular:     Rate and Rhythm: Normal rate and regular rhythm.  Pulmonary:     Effort: Pulmonary effort is normal.     Breath sounds: Normal breath sounds.  Musculoskeletal:        General: Normal range of motion.  Skin:    General: Skin is warm and  dry.  Neurological:     Mental Status: She is alert.  Psychiatric:        Mood and Affect: Mood normal.        Behavior: Behavior normal.    BP 110/72 (BP Location: Left Arm, Patient Position: Sitting, Cuff Size: Normal)   Pulse 75   Temp (!) 97.3 F (36.3 C) (Temporal)   Ht 5' 7 (1.702 m)   Wt 162 lb (73.5 kg)   SpO2 98%   BMI 25.37 kg/m  Wt Readings from Last 3 Encounters:  06/07/24 162 lb (73.5 kg)  12/28/23 163 lb 4 oz (74 kg)  12/08/23 166 lb 9.6 oz (75.6 kg)      Lucius Krabbe, NP

## 2024-06-18 ENCOUNTER — Encounter: Payer: Self-pay | Admitting: Family

## 2024-07-02 LAB — GENECONNECT MOLECULAR SCREEN: Genetic Analysis Overall Interpretation: NEGATIVE

## 2024-07-03 ENCOUNTER — Other Ambulatory Visit: Payer: Self-pay

## 2024-07-09 ENCOUNTER — Other Ambulatory Visit: Payer: Self-pay

## 2024-07-09 DIAGNOSIS — G43009 Migraine without aura, not intractable, without status migrainosus: Secondary | ICD-10-CM

## 2024-07-10 ENCOUNTER — Telehealth: Payer: Self-pay

## 2024-07-10 NOTE — Telephone Encounter (Signed)
 Pharmacy Patient Advocate Encounter   Received notification from Fax that prior authorization for Ubrelvy  100mg  Tablet is due for renewal.   Insurance verification completed.   The patient is insured through [facility].  Action: Patient hasn't been seen in your office in over a year. Plan requires updated chart notes for PA renewal.

## 2024-07-20 ENCOUNTER — Other Ambulatory Visit (HOSPITAL_BASED_OUTPATIENT_CLINIC_OR_DEPARTMENT_OTHER): Payer: Self-pay

## 2024-07-24 ENCOUNTER — Other Ambulatory Visit: Payer: Self-pay | Admitting: Orthopedic Surgery

## 2024-07-25 NOTE — Progress Notes (Signed)
 Sent message, via epic in basket, requesting orders in epic from Careers adviser.

## 2024-07-26 ENCOUNTER — Encounter (HOSPITAL_COMMUNITY): Payer: Self-pay | Admitting: *Deleted

## 2024-07-26 ENCOUNTER — Encounter (HOSPITAL_COMMUNITY)
Admission: RE | Admit: 2024-07-26 | Discharge: 2024-07-26 | Disposition: A | Source: Ambulatory Visit | Attending: Orthopedic Surgery

## 2024-07-26 ENCOUNTER — Other Ambulatory Visit: Payer: Self-pay

## 2024-07-26 DIAGNOSIS — Z01818 Encounter for other preprocedural examination: Secondary | ICD-10-CM

## 2024-07-26 DIAGNOSIS — Z01812 Encounter for preprocedural laboratory examination: Secondary | ICD-10-CM | POA: Diagnosis not present

## 2024-07-26 HISTORY — DX: Gastro-esophageal reflux disease without esophagitis: K21.9

## 2024-07-26 LAB — CBC
HCT: 46.3 % — ABNORMAL HIGH (ref 36.0–46.0)
Hemoglobin: 15.3 g/dL — ABNORMAL HIGH (ref 12.0–15.0)
MCH: 30.9 pg (ref 26.0–34.0)
MCHC: 33 g/dL (ref 30.0–36.0)
MCV: 93.5 fL (ref 80.0–100.0)
Platelets: 322 K/uL (ref 150–400)
RBC: 4.95 MIL/uL (ref 3.87–5.11)
RDW: 13.3 % (ref 11.5–15.5)
WBC: 8.6 K/uL (ref 4.0–10.5)
nRBC: 0 % (ref 0.0–0.2)

## 2024-07-26 NOTE — Progress Notes (Deleted)
 COVID Vaccine received:  []  No []  Yes Date of any COVID positive Test in last 90 days:  PCP - Corean Littler NP Cardiologist -   Chest x-ray -  EKG -   Stress Test -  ECHO - 01/08/20 Epic Cardiac Cath -   Bowel Prep - []  No  []   Yes ______  Pacemaker / ICD device []  No []  Yes   Spinal Cord Stimulator:[]  No []  Yes       History of Sleep Apnea? []  No []  Yes   CPAP used?- []  No []  Yes    Does the patient monitor blood sugar?          []  No []  Yes  []  N/A  Patient has: []  NO Hx DM   []  Pre-DM                 []  DM1  []   DM2 Does patient have a [employer] or Dexacom? []  No []  Yes   Fasting Blood Sugar Ranges-  Checks Blood Sugar _____ times a day  GLP1 agonist / usual dose -  GLP1 instructions:  SGLT-2 inhibitors / usual dose -  SGLT-2 instructions:   Blood Thinner / Instructions: Aspirin Instructions:  Comments:   Activity level: Patient is able / unable to climb a flight of stairs without difficulty; []  No CP  []  No SOB, but would have ___   Patient can / can not perform ADLs without assistance.   Anesthesia review: Dysautonomia  Patient denies shortness of breath, fever, cough and chest pain at PAT appointment.  Patient verbalized understanding and agreement to the Pre-Surgical Instructions that were given to them at this PAT appointment. Patient was also educated of the need to review these PAT instructions again prior to his/her surgery.I reviewed the appropriate phone numbers to call if they have any and questions or concerns.

## 2024-07-26 NOTE — Patient Instructions (Signed)
 SURGICAL WAITING ROOM VISITATION  Patients having surgery or a procedure may have no more than 2 support people in the waiting area - these visitors may rotate.    Children under the age of 77 must have an adult with them who is not the patient.  Visitors with respiratory illnesses are discouraged from visiting and should remain at home.  If the patient needs to stay at the hospital during part of their recovery, the visitor guidelines for inpatient rooms apply. Pre-op nurse will coordinate an appropriate time for 1 support person to accompany patient in pre-op.  This support person may not rotate.    Please refer to the [facility] website for the visitor guidelines for Inpatients (after your surgery is over and you are in a regular room).       Your procedure is scheduled on: 08/02/24   Report to [facility] Main Entrance    Report to admitting at 9:25 AM   Call this number if you have problems the morning of surgery [phone]   Do not eat food :After Midnight.   After Midnight you may have the following liquids until 8:40 AM DAY OF SURGERY  Water Non-Citrus Juices (without pulp, NO RED-Apple, White grape, White cranberry) Black Coffee (NO MILK/CREAM OR CREAMERS, sugar ok)  Clear Tea (NO MILK/CREAM OR CREAMERS, sugar ok) regular and decaf                             Plain Jell-O (NO RED)                                           Fruit ices (not with fruit pulp, NO RED)                                     Popsicles (NO RED)                                                               Sports drinks like Gatorade (NO RED)                 The day of surgery:  Drink ONE (1) Pre-Surgery Clear Ensure  at 8:40 AM the morning of surgery. Drink in one sitting. Do not sip.  This drink was given to you during your hospital  pre-op appointment visit. Nothing else to drink after completing the  Pre-Surgery Clear Ensure.     Oral Hygiene is also important to reduce your  risk of infection.                                    Remember - BRUSH YOUR TEETH THE MORNING OF SURGERY WITH YOUR REGULAR TOOTHPASTE   Stop all vitamins and herbal supplements 7 days before surgery.   Take these medicines the morning of surgery with A SIP OF WATER: omeprazole, sertraline , tylenol  if needed, inhaler             You may  not have any metal on your body including hair pins, jewelry, and body piercing             Do not wear make-up, lotions, powders, perfumes/cologne, or deodorant  Do not wear nail polish including gel and S&S, artificial/acrylic nails, or any other type of covering on natural nails including finger and toenails. If you have artificial nails, gel coating, etc. that needs to be removed by a nail salon please have this removed prior to surgery or surgery may need to be canceled/ delayed if the surgeon/ anesthesia feels like they are unable to be safely monitored.   Do not shave  48 hours prior to surgery.                Do not bring valuables to the hospital. [place] IS NOT             RESPONSIBLE   FOR VALUABLES.   Contacts, glasses, dentures or bridgework may not be worn into surgery.   DO NOT BRING YOUR HOME MEDICATIONS TO THE HOSPITAL. PHARMACY WILL DISPENSE MEDICATIONS LISTED ON YOUR MEDICATION LIST TO YOU DURING YOUR ADMISSION IN THE HOSPITAL!    Patients discharged on the day of surgery will not be allowed to drive home.  Someone NEEDS to stay with you for the first 24 hours after anesthesia.   Special Instructions: Bring a copy of your healthcare power of attorney and living will documents the day of surgery if you haven't scanned them before.              Please read over the following fact sheets you were given: IF YOU HAVE QUESTIONS ABOUT YOUR PRE-OP INSTRUCTIONS PLEASE CALL [phone] Alison Nichols   If you received a COVID test during your pre-op visit  it is requested that you wear a mask when out in public, stay away from anyone that may not be  feeling well and notify your surgeon if you develop symptoms. If you test positive for Covid or have been in contact with anyone that has tested positive in the last 10 days please notify you surgeon.    [facility] - Preparing for Surgery Before surgery, you can play an important role.  Because skin is not sterile, your skin needs to be as free of germs as possible.  You can reduce the number of germs on your skin by washing with CHG (chlorahexidine gluconate) soap before surgery.  CHG is an antiseptic cleaner which kills germs and bonds with the skin to continue killing germs even after washing. Please DO NOT use if you have an allergy to CHG or antibacterial soaps.  If your skin becomes reddened/irritated stop using the CHG and inform your nurse when you arrive at Short Stay. Do not shave (including legs and underarms) for at least 48 hours prior to the first CHG shower.  You may shave your face/neck.  Please follow these instructions carefully:  1.  Shower with CHG Soap the night before surgery ONLY (DO NOT USE THE SOAP THE MORNING OF SURGERY).  2.  If you choose to wash your hair, wash your hair first as usual with your normal  shampoo.  3.  After you shampoo, rinse your hair and body thoroughly to remove the shampoo.                             4.  Use CHG as you would any other liquid soap.  You can apply chg directly to the skin and wash.  Gently with a scrungie or clean washcloth.  5.  Apply the CHG Soap to your body ONLY FROM THE NECK DOWN.   Do   not use on face/ open                           Wound or open sores. Avoid contact with eyes, ears mouth and   genitals (private parts).                       Wash face,  Genitals (private parts) with your normal soap.             6.  Wash thoroughly, paying special attention to the area where your    surgery  will be performed.  7.  Thoroughly rinse your body with warm water from the neck down.  8.  DO NOT shower/wash with your normal soap after  using and rinsing off the CHG Soap.                9.  Pat yourself dry with a clean towel.            10.  Wear clean pajamas.            11.  Place clean sheets on your bed the night of your first shower and do not  sleep with pets. Day of Surgery : Do not apply any CHG, lotions/deodorants the morning of surgery.  Please wear clean clothes to the hospital/surgery center.  FAILURE TO FOLLOW THESE INSTRUCTIONS MAY RESULT IN THE CANCELLATION OF YOUR SURGERY  PATIENT SIGNATURE_________________________________  NURSE SIGNATURE__________________________________  ________________________________________________________________________

## 2024-07-26 NOTE — Patient Instructions (Signed)
 SURGICAL WAITING ROOM VISITATION  Patients having surgery or a procedure may have no more than 2 support people in the waiting area - these visitors may rotate.    Children under the age of 77 must have an adult with them who is not the patient.  Visitors with respiratory illnesses are discouraged from visiting and should remain at home.  If the patient needs to stay at the hospital during part of their recovery, the visitor guidelines for inpatient rooms apply. Pre-op nurse will coordinate an appropriate time for 1 support person to accompany patient in pre-op.  This support person may not rotate.    Please refer to the [facility] website for the visitor guidelines for Inpatients (after your surgery is over and you are in a regular room).       Your procedure is scheduled on: 08/02/24   Report to [facility] Main Entrance    Report to admitting at 9:25 AM   Call this number if you have problems the morning of surgery [phone]   Do not eat food :After Midnight.   After Midnight you may have the following liquids until 8:40 AM DAY OF SURGERY  Water Non-Citrus Juices (without pulp, NO RED-Apple, White grape, White cranberry) Black Coffee (NO MILK/CREAM OR CREAMERS, sugar ok)  Clear Tea (NO MILK/CREAM OR CREAMERS, sugar ok) regular and decaf                             Plain Jell-O (NO RED)                                           Fruit ices (not with fruit pulp, NO RED)                                     Popsicles (NO RED)                                                               Sports drinks like Gatorade (NO RED)                   The day of surgery:  Drink ONE (1) Pre-Surgery Clear Ensure  at 8:40 AM the morning of surgery. Drink in one sitting. Do not sip.  This drink was given to you during your hospital  pre-op appointment visit. Nothing else to drink after completing the  Pre-Surgery Clear Ensure.       Oral Hygiene is also important to reduce  your risk of infection.                                    Remember - BRUSH YOUR TEETH THE MORNING OF SURGERY WITH YOUR REGULAR TOOTHPASTE   Stop all vitamins and herbal supplements 7 days before surgery.   Take these medicines the morning of surgery with A SIP OF WATER: Omeprazole, sertraline , tylenol  if needed, inhaler  You may not have any metal on your body including hair pins, jewelry, and body piercing             Do not wear make-up, lotions, powders, perfumes/cologne, or deodorant  Do not wear nail polish including gel and S&S, artificial/acrylic nails, or any other type of covering on natural nails including finger and toenails. If you have artificial nails, gel coating, etc. that needs to be removed by a nail salon please have this removed prior to surgery or surgery may need to be canceled/ delayed if the surgeon/ anesthesia feels like they are unable to be safely monitored.   Do not shave  48 hours prior to surgery.               Do not bring valuables to the hospital. [place] IS NOT             RESPONSIBLE   FOR VALUABLES.   Contacts, glasses, dentures or bridgework may not be worn into surgery.   DO NOT BRING YOUR HOME MEDICATIONS TO THE HOSPITAL. PHARMACY WILL DISPENSE MEDICATIONS LISTED ON YOUR MEDICATION LIST TO YOU DURING YOUR ADMISSION IN THE HOSPITAL!    Patients discharged on the day of surgery will not be allowed to drive home.  Someone NEEDS to stay with you for the first 24 hours after anesthesia.   Special Instructions: Bring a copy of your healthcare power of attorney and living will documents the day of surgery if you haven't scanned them before.              Please read over the following fact sheets you were given: IF YOU HAVE QUESTIONS ABOUT YOUR PRE-OP INSTRUCTIONS PLEASE [phone] [place]   If you received a COVID test during your pre-op visit  it is requested that you wear a mask when out in public, stay away from anyone that may  not be feeling well and notify your surgeon if you develop symptoms. If you test positive for Covid or have been in contact with anyone that has tested positive in the last 10 days please notify you surgeon.    [facility] - Preparing for Surgery Before surgery, you can play an important role.  Because skin is not sterile, your skin needs to be as free of germs as possible.  You can reduce the number of germs on your skin by washing with CHG (chlorahexidine gluconate) soap before surgery.  CHG is an antiseptic cleaner which kills germs and bonds with the skin to continue killing germs even after washing. Please DO NOT use if you have an allergy to CHG or antibacterial soaps.  If your skin becomes reddened/irritated stop using the CHG and inform your nurse when you arrive at Short Stay. Do not shave (including legs and underarms) for at least 48 hours prior to the first CHG shower.  You may shave your face/neck.  Please follow these instructions carefully:  1.  Shower with CHG Soap the night before surgery ONLY (DO NOT USE THE SOAP THE MORNING OF SURGERY).  2.  If you choose to wash your hair, wash your hair first as usual with your normal  shampoo.  3.  After you shampoo, rinse your hair and body thoroughly to remove the shampoo.                             4.  Use CHG as you would any other liquid soap.  You can apply chg directly to the skin and wash.  Gently with a scrungie or clean washcloth.  5.  Apply the CHG Soap to your body ONLY FROM THE NECK DOWN.   Do   not use on face/ open                           Wound or open sores. Avoid contact with eyes, ears mouth and   genitals (private parts).                       Wash face,  Genitals (private parts) with your normal soap.             6.  Wash thoroughly, paying special attention to the area where your    surgery  will be performed.  7.  Thoroughly rinse your body with warm water from the neck down.  8.  DO NOT shower/wash with your normal  soap after using and rinsing off the CHG Soap.                9.  Pat yourself dry with a clean towel.            10.  Wear clean pajamas.            11.  Place clean sheets on your bed the night of your first shower and do not  sleep with pets. Day of Surgery : Do not apply any CHG, lotions/deodorants the morning of surgery.  Please wear clean clothes to the hospital/surgery center.  FAILURE TO FOLLOW THESE INSTRUCTIONS MAY RESULT IN THE CANCELLATION OF YOUR SURGERY  PATIENT SIGNATURE_________________________________  NURSE SIGNATURE__________________________________  ________________________________________________________________________

## 2024-07-26 NOTE — Progress Notes (Signed)
 COVID Vaccine received:  []  No []  Yes Date of any COVID positive Test in last 90 days:  PCP - Corean Littler NP Cardiologist -   Chest x-ray -  EKG -   Stress Test -  ECHO - 01/08/20 Epic Cardiac Cath -   Bowel Prep - []  No  []   Yes ______  Pacemaker / ICD device []  No []  Yes   Spinal Cord Stimulator:[]  No []  Yes       History of Sleep Apnea? []  No []  Yes   CPAP used?- []  No []  Yes    Does the patient monitor blood sugar?          []  No []  Yes  []  N/A  Patient has: []  NO Hx DM   []  Pre-DM                 []  DM1  []   DM2 Does patient have a [employer] or [employer]? []  No []  Yes   Fasting Blood Sugar Ranges-  Checks Blood Sugar _____ times a day  GLP1 agonist / usual dose -  GLP1 instructions:  SGLT-2 inhibitors / usual dose -  SGLT-2 instructions:   Blood Thinner / Instructions: Aspirin Instructions:  Comments:   Activity level: Patient is able / unable to climb a flight of stairs without difficulty; []  No CP  []  No SOB, but would have ___   Patient can / can not perform ADLs without assistance.   Anesthesia review:   Patient denies shortness of breath, fever, cough and chest pain at PAT appointment.  Patient verbalized understanding and agreement to the Pre-Surgical Instructions that were given to them at this PAT appointment. Patient was also educated of the need to review these PAT instructions again prior to his/her surgery.I reviewed the appropriate phone numbers to call if they have any and questions or concerns.

## 2024-07-26 NOTE — Progress Notes (Addendum)
 COVID Vaccine received:  []  No [x]  Yes Date of any COVID positive Test in last 90 days: no PCP - Corean Littler NP Cardiologist - Dr. Fernande  Chest x-ray -  EKG -   Stress Test -  ECHO - 01/08/20 Epic Cardiac Cath -   Bowel Prep - [x]  No  []   Yes ______  Pacemaker / ICD device [x]  No []  Yes   Spinal Cord Stimulator:[x]  No []  Yes       History of Sleep Apnea? [x]  No []  Yes   CPAP used?- [x]  No []  Yes    Does the patient monitor blood sugar?          [x]  No []  Yes  []  N/A  Patient has: [x]  NO Hx DM   []  Pre-DM                 []  DM1  []   DM2 Does patient have a [employer] or [employer]? []  No []  Yes   Fasting Blood Sugar Ranges-  Checks Blood Sugar _____ times a day  GLP1 agonist / usual dose - no GLP1 instructions:  SGLT-2 inhibitors / usual dose - no SGLT-2 instructions:   Blood Thinner / Instructions:no Aspirin Instructions:no  Comments:   Activity level: Patient is able to climb a flight of stairs without difficulty; [x]  No CP  [x]  No SOB,  Patient can perform ADLs without assistance.   Anesthesia review: Dysautonomia  Patient denies shortness of breath, fever, cough and chest pain at PAT appointment.  Patient verbalized understanding and agreement to the Pre-Surgical Instructions that were given to them at this PAT appointment. Patient was also educated of the need to review these PAT instructions again prior to his/her surgery.I reviewed the appropriate phone numbers to call if they have any and questions or concerns.

## 2024-07-27 ENCOUNTER — Encounter (HOSPITAL_COMMUNITY)
Admission: RE | Admit: 2024-07-27 | Discharge: 2024-07-27 | Disposition: A | Source: Ambulatory Visit | Attending: Orthopedic Surgery

## 2024-07-27 DIAGNOSIS — Z01818 Encounter for other preprocedural examination: Secondary | ICD-10-CM

## 2024-07-31 ENCOUNTER — Other Ambulatory Visit (HOSPITAL_BASED_OUTPATIENT_CLINIC_OR_DEPARTMENT_OTHER): Payer: Self-pay

## 2024-08-02 ENCOUNTER — Encounter (HOSPITAL_COMMUNITY): Payer: Self-pay | Admitting: Orthopedic Surgery

## 2024-08-02 ENCOUNTER — Ambulatory Visit (HOSPITAL_COMMUNITY)
Admission: RE | Admit: 2024-08-02 | Discharge: 2024-08-02 | Disposition: A | Discharge status: Home / Self Care | Attending: Orthopedic Surgery | Admitting: Orthopedic Surgery

## 2024-08-02 ENCOUNTER — Encounter: Admission: RE | Disposition: A | Payer: Self-pay | Attending: Orthopedic Surgery

## 2024-08-02 ENCOUNTER — Other Ambulatory Visit (HOSPITAL_COMMUNITY): Payer: Self-pay

## 2024-08-02 ENCOUNTER — Encounter (HOSPITAL_COMMUNITY): Payer: Self-pay | Admitting: Physician Assistant

## 2024-08-02 ENCOUNTER — Ambulatory Visit (HOSPITAL_COMMUNITY): Admitting: Anesthesiology

## 2024-08-02 DIAGNOSIS — R519 Headache, unspecified: Secondary | ICD-10-CM | POA: Diagnosis not present

## 2024-08-02 DIAGNOSIS — M7582 Other shoulder lesions, left shoulder: Secondary | ICD-10-CM | POA: Diagnosis not present

## 2024-08-02 DIAGNOSIS — S46012D Strain of muscle(s) and tendon(s) of the rotator cuff of left shoulder, subsequent encounter: Secondary | ICD-10-CM | POA: Diagnosis present

## 2024-08-02 DIAGNOSIS — S43492A Other sprain of left shoulder joint, initial encounter: Secondary | ICD-10-CM | POA: Diagnosis not present

## 2024-08-02 DIAGNOSIS — M7552 Bursitis of left shoulder: Secondary | ICD-10-CM | POA: Diagnosis not present

## 2024-08-02 DIAGNOSIS — K219 Gastro-esophageal reflux disease without esophagitis: Secondary | ICD-10-CM | POA: Diagnosis not present

## 2024-08-02 DIAGNOSIS — S43431A Superior glenoid labrum lesion of right shoulder, initial encounter: Secondary | ICD-10-CM | POA: Diagnosis not present

## 2024-08-02 DIAGNOSIS — F32A Depression, unspecified: Secondary | ICD-10-CM | POA: Diagnosis not present

## 2024-08-02 DIAGNOSIS — J45909 Unspecified asthma, uncomplicated: Secondary | ICD-10-CM | POA: Diagnosis not present

## 2024-08-02 DIAGNOSIS — F419 Anxiety disorder, unspecified: Secondary | ICD-10-CM | POA: Diagnosis not present

## 2024-08-02 DIAGNOSIS — M75102 Unspecified rotator cuff tear or rupture of left shoulder, not specified as traumatic: Secondary | ICD-10-CM | POA: Diagnosis not present

## 2024-08-02 DIAGNOSIS — X58XXXA Exposure to other specified factors, initial encounter: Secondary | ICD-10-CM | POA: Diagnosis not present

## 2024-08-02 DIAGNOSIS — Z79899 Other long term (current) drug therapy: Secondary | ICD-10-CM | POA: Diagnosis not present

## 2024-08-02 DIAGNOSIS — S46012A Strain of muscle(s) and tendon(s) of the rotator cuff of left shoulder, initial encounter: Secondary | ICD-10-CM | POA: Diagnosis not present

## 2024-08-02 DIAGNOSIS — M7542 Impingement syndrome of left shoulder: Secondary | ICD-10-CM | POA: Diagnosis not present

## 2024-08-02 HISTORY — PX: POSTERIOR LUMBAR FUSION 2 WITH HARDWARE REMOVAL: SHX7297

## 2024-08-02 HISTORY — PX: SHOULDER ARTHROSCOPY WITH ROTATOR CUFF REPAIR: SHX5685

## 2024-08-02 HISTORY — PX: SHOULDER ARTHROSCOPY WITH LABRAL REPAIR: SHX5691

## 2024-08-02 SURGERY — ARTHROSCOPY, SHOULDER, WITH ROTATOR CUFF REPAIR
Anesthesia: General | Site: Shoulder | Laterality: Left

## 2024-08-02 MED ORDER — LACTATED RINGERS IV SOLN: INTRAVENOUS | Status: DC

## 2024-08-02 MED ORDER — HYDROMORPHONE HCL 1 MG/ML IJ SOLN: 0.2500 mg | INTRAMUSCULAR | Status: DC | PRN

## 2024-08-02 MED ORDER — OXYCODONE HCL 5 MG PO TABS: 5.0000 mg | ORAL_TABLET | Freq: Once | ORAL | Status: DC | PRN

## 2024-08-02 MED ORDER — PROPOFOL 1000 MG/100ML IV EMUL
INTRAVENOUS | Status: AC
  Filled 2024-08-02: qty 100

## 2024-08-02 MED ORDER — SUGAMMADEX SODIUM 200 MG/2ML IV SOLN
INTRAVENOUS | Status: AC
  Filled 2024-08-02: qty 2

## 2024-08-02 MED ORDER — LIDOCAINE HCL (PF) 2 % IJ SOLN
INTRAMUSCULAR | Status: AC
  Filled 2024-08-02: qty 5

## 2024-08-02 MED ORDER — AMISULPRIDE (ANTIEMETIC) 5 MG/2ML IV SOLN: 10.0000 mg | Freq: Once | INTRAVENOUS | Status: DC | PRN

## 2024-08-02 MED ORDER — PROPOFOL 500 MG/50ML IV EMUL
INTRAVENOUS | Status: DC | PRN
  Administered 2024-08-02: 125 ug/kg/min via INTRAVENOUS

## 2024-08-02 MED ORDER — BUPIVACAINE HCL (PF) 0.5 % IJ SOLN
INTRAMUSCULAR | Status: DC | PRN
  Administered 2024-08-02: 20 mL via PERINEURAL

## 2024-08-02 MED ORDER — POVIDONE-IODINE 7.5 % EX SOLN: Freq: Once | CUTANEOUS | Status: DC

## 2024-08-02 MED ORDER — MIDAZOLAM HCL (PF) 2 MG/2ML IJ SOLN
2.0000 mg | INTRAMUSCULAR | Status: DC
  Administered 2024-08-02: 2 mg via INTRAVENOUS
  Filled 2024-08-02: qty 2

## 2024-08-02 MED ORDER — VANCOMYCIN HCL IN DEXTROSE 1-5 GM/200ML-% IV SOLN
1000.0000 mg | INTRAVENOUS | Status: AC
  Administered 2024-08-02: 1000 mg via INTRAVENOUS
  Filled 2024-08-02: qty 200

## 2024-08-02 MED ORDER — DEXAMETHASONE SOD PHOSPHATE PF 10 MG/ML IJ SOLN
INTRAMUSCULAR | Status: DC | PRN
  Administered 2024-08-02: 10 mg via INTRAVENOUS

## 2024-08-02 MED ORDER — PHENYLEPHRINE 80 MCG/ML (10ML) SYRINGE FOR IV PUSH (FOR BLOOD PRESSURE SUPPORT)
PREFILLED_SYRINGE | INTRAVENOUS | Status: AC
  Filled 2024-08-02: qty 10

## 2024-08-02 MED ORDER — FENTANYL CITRATE (PF) 50 MCG/ML IJ SOSY
50.0000 ug | PREFILLED_SYRINGE | INTRAMUSCULAR | Status: DC
  Administered 2024-08-02: 100 ug via INTRAVENOUS
  Filled 2024-08-02: qty 2

## 2024-08-02 MED ORDER — METHOCARBAMOL 500 MG PO TABS
500.0000 mg | ORAL_TABLET | Freq: Four times a day (QID) | ORAL | 0 refills | Status: AC | PRN
  Filled 2024-08-02: qty 30, 8d supply, fill #0

## 2024-08-02 MED ORDER — ARTIFICIAL TEARS OPHTHALMIC OINT
TOPICAL_OINTMENT | OPHTHALMIC | Status: AC
  Filled 2024-08-02: qty 3.5

## 2024-08-02 MED ORDER — FENTANYL CITRATE (PF) 100 MCG/2ML IJ SOLN
INTRAMUSCULAR | Status: AC
  Filled 2024-08-02: qty 2

## 2024-08-02 MED ORDER — OXYCODONE HCL 5 MG PO TABS
5.0000 mg | ORAL_TABLET | ORAL | 0 refills | Status: AC | PRN
  Filled 2024-08-02: qty 30, 5d supply, fill #0

## 2024-08-02 MED ORDER — BUPIVACAINE LIPOSOME 1.3 % IJ SUSP
INTRAMUSCULAR | Status: DC | PRN
  Administered 2024-08-02: 10 mL via PERINEURAL

## 2024-08-02 MED ORDER — PROPOFOL 10 MG/ML IV BOLUS
INTRAVENOUS | Status: AC
  Filled 2024-08-02: qty 20

## 2024-08-02 MED ORDER — ONDANSETRON HCL 4 MG/2ML IJ SOLN
INTRAMUSCULAR | Status: AC
  Filled 2024-08-02: qty 2

## 2024-08-02 MED ORDER — FENTANYL CITRATE (PF) 250 MCG/5ML IJ SOLN
INTRAMUSCULAR | Status: DC | PRN
  Administered 2024-08-02: 50 ug via INTRAVENOUS

## 2024-08-02 MED ORDER — ORAL CARE MOUTH RINSE: 15.0000 mL | Freq: Once | OROMUCOSAL | Status: AC

## 2024-08-02 MED ORDER — ROCURONIUM BROMIDE 10 MG/ML (PF) SYRINGE
PREFILLED_SYRINGE | INTRAVENOUS | Status: AC
  Filled 2024-08-02: qty 10

## 2024-08-02 MED ORDER — ROCURONIUM BROMIDE 10 MG/ML (PF) SYRINGE
PREFILLED_SYRINGE | INTRAVENOUS | Status: DC | PRN
  Administered 2024-08-02: 50 mg via INTRAVENOUS

## 2024-08-02 MED ORDER — CHLORHEXIDINE GLUCONATE 0.12 % MT SOLN
15.0000 mL | Freq: Once | OROMUCOSAL | Status: AC
  Administered 2024-08-02: 15 mL via OROMUCOSAL

## 2024-08-02 MED ORDER — ONDANSETRON HCL 4 MG/2ML IJ SOLN
INTRAMUSCULAR | Status: DC | PRN
  Administered 2024-08-02: 4 mg via INTRAVENOUS

## 2024-08-02 MED ORDER — PROPOFOL 10 MG/ML IV BOLUS
INTRAVENOUS | Status: DC | PRN
  Administered 2024-08-02: 150 mg via INTRAVENOUS

## 2024-08-02 MED ORDER — PHENYLEPHRINE 80 MCG/ML (10ML) SYRINGE FOR IV PUSH (FOR BLOOD PRESSURE SUPPORT)
PREFILLED_SYRINGE | INTRAVENOUS | Status: DC | PRN
  Administered 2024-08-02: 80 ug via INTRAVENOUS

## 2024-08-02 MED ORDER — LIDOCAINE HCL (PF) 2 % IJ SOLN
INTRAMUSCULAR | Status: DC | PRN
  Administered 2024-08-02: 40 mg via INTRADERMAL

## 2024-08-02 MED ORDER — SODIUM CHLORIDE 0.9 % IR SOLN
Status: DC | PRN
  Administered 2024-08-02: 3000 mL

## 2024-08-02 MED ORDER — SUGAMMADEX SODIUM 200 MG/2ML IV SOLN
INTRAVENOUS | Status: DC | PRN
  Administered 2024-08-02: 200 mg via INTRAVENOUS

## 2024-08-02 MED ORDER — MEPERIDINE HCL 25 MG/ML IJ SOLN: 6.2500 mg | INTRAMUSCULAR | Status: DC | PRN

## 2024-08-02 MED ORDER — LEVOFLOXACIN IN D5W 500 MG/100ML IV SOLN
500.0000 mg | INTRAVENOUS | Status: AC
  Administered 2024-08-02: 500 mg via INTRAVENOUS
  Filled 2024-08-02: qty 100

## 2024-08-02 MED ORDER — OXYCODONE HCL 5 MG/5ML PO SOLN: 5.0000 mg | Freq: Once | ORAL | Status: DC | PRN

## 2024-08-02 SURGICAL SUPPLY — 59 items
BAG COUNTER SPONGE SURGICOUNT (BAG) IMPLANT
BOOTIES KNEE HIGH SLOAN (MISCELLANEOUS) ×4 IMPLANT
BURR OVAL 8 FLU 4.0X13 (MISCELLANEOUS) ×2 IMPLANT
CANNULA 5.75X7 CRYSTAL CLEAR (CANNULA) ×2 IMPLANT
CANNULA TWIST IN 8.25X7CM (CANNULA) IMPLANT
COOLER ICEMAN CLASSIC (MISCELLANEOUS) IMPLANT
COVER SURGICAL LIGHT HANDLE (MISCELLANEOUS) ×2 IMPLANT
CUTTER BONE 4.0MM X 13CM (MISCELLANEOUS) ×2 IMPLANT
DISSECTOR 3.8MM X 13CM (MISCELLANEOUS) IMPLANT
DRAPE FOOT SWITCH (DRAPES) ×4 IMPLANT
DRAPE IMP U-DRAPE 54X76 (DRAPES) ×2 IMPLANT
DRAPE INCISE IOBAN 66X45 STRL (DRAPES) IMPLANT
DRAPE STERI 35X30 U-POUCH (DRAPES) ×2 IMPLANT
DRAPE SURG 17X23 STRL (DRAPES) ×2 IMPLANT
DRAPE SURG ORHT 6 SPLT 77X108 (DRAPES) ×4 IMPLANT
DRAPE TOP 10253 STERILE (DRAPES) ×2 IMPLANT
DRAPE U-SHAPE 47X51 STRL (DRAPES) ×4 IMPLANT
DRSG XEROFORM 1X8 (GAUZE/BANDAGES/DRESSINGS) IMPLANT
DURAPREP 26ML APPLICATOR (WOUND CARE) ×2 IMPLANT
ELECT REM PT RETURN 15FT ADLT (MISCELLANEOUS) ×2 IMPLANT
GAUZE PAD ABD 8X10 STRL (GAUZE/BANDAGES/DRESSINGS) ×4 IMPLANT
GAUZE SPONGE 4X4 12PLY STRL (GAUZE/BANDAGES/DRESSINGS) ×2 IMPLANT
GAUZE XEROFORM 1X8 LF (GAUZE/BANDAGES/DRESSINGS) ×2 IMPLANT
GLOVE BIO SURGEON STRL SZ7 (GLOVE) ×2 IMPLANT
GLOVE BIO SURGEON STRL SZ7.5 (GLOVE) ×2 IMPLANT
GLOVE BIOGEL PI IND STRL 6.5 (GLOVE) ×2 IMPLANT
GLOVE BIOGEL PI IND STRL 7.0 (GLOVE) ×2 IMPLANT
GLOVE BIOGEL PI IND STRL 8 (GLOVE) ×2 IMPLANT
GLOVE SURG POLYISO LF SZ6.5 (GLOVE) ×2 IMPLANT
GLOVE SURG SS PI 6.5 STRL IVOR (GLOVE) ×2 IMPLANT
GOWN STRL REUS W/ TWL LRG LVL3 (GOWN DISPOSABLE) ×2 IMPLANT
GOWN STRL REUS W/ TWL XL LVL3 (GOWN DISPOSABLE) ×2 IMPLANT
KIT BASIN OR (CUSTOM PROCEDURE TRAY) ×2 IMPLANT
KIT CVD SPEAR FBRTK 1.8 DRILL (KITS) IMPLANT
KIT TURNOVER KIT A (KITS) ×2 IMPLANT
LASSO 90 CVE QUICKPAS (DISPOSABLE) IMPLANT
LASSO CRESCENT QUICKPASS (SUTURE) IMPLANT
MANIFOLD NEPTUNE II (INSTRUMENTS) ×2 IMPLANT
MAT ABSORB FLUID 56X50 GRAY (MISCELLANEOUS) ×2 IMPLANT
NDL HD SCORPION MEGA LOADER (NEEDLE) IMPLANT
PACK ARTHROSCOPY WL (CUSTOM PROCEDURE TRAY) ×2 IMPLANT
PAD COLD SHLDR WRAP-ON (PAD) IMPLANT
PROTECTOR NERVE ULNAR (MISCELLANEOUS) ×2 IMPLANT
RESTRAINT HEAD UNIVERSAL NS (MISCELLANEOUS) ×2 IMPLANT
SLING ARM FOAM STRAP LRG (SOFTGOODS) IMPLANT
SLING ARM FOAM STRAP MED (SOFTGOODS) IMPLANT
SLING ARM IMMOBILIZER LRG (SOFTGOODS) IMPLANT
SLING ARM IMMOBILIZER MED (SOFTGOODS) IMPLANT
SUPPORT WRAP ARM LG (MISCELLANEOUS) ×2 IMPLANT
SUT ETHILON 3 0 PS 1 (SUTURE) ×2 IMPLANT
SUT PDS AB 1 CT1 27 (SUTURE) IMPLANT
SUT TIGER TAPE 7 IN WHITE (SUTURE) IMPLANT
SUT VIC AB 0 CT1 36 (SUTURE) IMPLANT
SUTURE TAPE 1.3 40 TPR END (SUTURE) IMPLANT
TAPE FIBER 2MM 7IN #2 BLUE (SUTURE) IMPLANT
TOWEL OR DSP ST BLU DLX 10/PK (DISPOSABLE) ×2 IMPLANT
TUBING ARTHROSCOPY IRRIG 16FT (MISCELLANEOUS) ×2 IMPLANT
TUBING CONNECTING 10 (TUBING) ×4 IMPLANT
WAND ABLATOR APOLLO I90 (BUR) ×2 IMPLANT

## 2024-08-02 NOTE — Transfer of Care (Signed)
 Immediate Anesthesia Transfer of Care Note  Patient: Alison Nichols  Procedure(s) Performed: ARTHROSCOPY, SHOULDER, WITH ROTATOR CUFF REPAIR (Left) ARTHROSCOPY, SHOULDER WITH DEBRIDEMENT (Left) ARTHROSCOPY, SHOULDER, WITH GLENOID LABRUM REPAIR (Left: Shoulder)  Patient Location: PACU  Anesthesia Type:General and Regional  Level of Consciousness: awake, alert , and oriented  Airway & Oxygen Therapy: Patient Spontanous Breathing and Patient connected to face mask oxygen  Post-op Assessment: Report given to RN and Post -op Vital signs reviewed and stable  Post vital signs: Reviewed and stable  Last Vitals:  Vitals Value Taken Time  BP    Temp    Pulse    Resp    SpO2      Last Pain:  Vitals:   08/02/24 0946  PainSc: 6       Patients Stated Pain Goal: 5 (08/02/24 0946)  Complications: No notable events documented.

## 2024-08-02 NOTE — Anesthesia Postprocedure Evaluation (Signed)
 Anesthesia Post Note  Patient: Alison Nichols  Procedure(s) Performed: ARTHROSCOPY, SHOULDER, (Left) ARTHROSCOPY, SHOULDER WITH DEBRIDEMENT (Left) ARTHROSCOPY, SHOULDER, WITH GLENOID LABRUM REPAIR (Left: Shoulder)     Patient location during evaluation: PACU Anesthesia Type: General Level of consciousness: awake and alert Pain management: pain level controlled Vital Signs Assessment: post-procedure vital signs reviewed and stable Respiratory status: spontaneous breathing, nonlabored ventilation and respiratory function stable Cardiovascular status: blood pressure returned to baseline and stable Postop Assessment: no apparent nausea or vomiting Anesthetic complications: no   No notable events documented.  Last Vitals:  Vitals:   08/02/24 1245 08/02/24 1300  BP: 117/78 117/83  Pulse: 62 67  Resp: 15 12  Temp: 36.5 C   SpO2: 98% 98%    Last Pain:  Vitals:   08/02/24 1300  PainSc: 0-No pain                 Butler Levander Pinal

## 2024-08-02 NOTE — Discharge Instructions (Addendum)
Discharge Instructions after Arthroscopic Shoulder Surgery   A sling has been provided for you. You may remove the sling after 72 hours. The sling may be worn for your protection, if you are in a crowd.  Use ice on the shoulder intermittently over the first 48 hours after surgery.  Pain medication has been prescribed for you.  Use your medication liberally over the first 48 hours, and then begin to taper your use. You may take Extra Strength Tylenol or Tylenol only in place of the pain pills. DO NOT take ANY nonsteroidal anti-inflammatory pain medications: Advil, Motrin, Ibuprofen, Aleve, Naproxen, or Naprosyn.  You may remove your dressing after two days.  You may shower 5 days after surgery. The incision CANNOT get wet prior to 5 days. Simply allow the water to wash over the site and then pat dry. Do not rub the incision. Make sure your axilla (armpit) is completely dry after showering.  Take one aspirin a day for 2 weeks after surgery, unless you have an aspirin sensitivity/allergy or asthma.  Three to 5 times each day you should perform assisted overhead reaching and external rotation (outward turning) exercises with the operative arm. Both exercises should be done with the non-operative arm used as the "therapist arm" while the operative arm remains relaxed. Ten of each exercise should be done three to five times each day.    Overhead reach is helping to lift your stiff arm up as high as it will go. To stretch your overhead reach, lie flat on your back, relax, and grasp the wrist of the tight shoulder with your opposite hand. Using the power in your opposite arm, bring the stiff arm up as far as it is comfortable. Start holding it for ten seconds and then work up to where you can hold it for a count of 30. Breathe slowly and deeply while the arm is moved. Repeat this stretch ten times, trying to help the arm up a little higher each time.       External rotation is turning the arm out to  the side while your elbow stays close to your body. External rotation is best stretched while you are lying on your back. Hold a cane, yardstick, broom handle, or dowel in both hands. Bend both elbows to a right angle. Use steady, gentle force from your normal arm to rotate the hand of the stiff shoulder out away from your body. Continue the rotation as far as it will go comfortably, holding it there for a count of 10. Repeat this exercise ten times.     Please call [phone] during normal business hours or [phone] after hours for any problems. Including the following:  - excessive redness of the incisions - drainage for more than 4 days - fever of more than 101.5 F  *Please note that pain medications will not be refilled after hours or on weekends.    

## 2024-08-02 NOTE — H&P (Signed)
 Alison Nichols is an 38 y.o. female.   Chief Complaint: L shoulder pain HPI: Status post injury at work with left shoulder pain which has failed extensive conservative management.  She has partial-thickness rotator cuff tear and some labral abnormalities by MRI.  Past Medical History:  Diagnosis Date   Acute upper respiratory infection 11/04/2010   Formatting of this note might be different from the original. Upper Respiratory Infection  10/1 IMO update   Adjustment disorder with anxiety 11/06/2010   Formatting of this note might be different from the original. 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   Bilateral impacted cerumen 10/04/2017   Bullous myringitis of left ear 10/04/2017   Complication of anesthesia    COVID-19 01/2019   scrachy throat x few days all symptoms resolved   Depression    Ehlers-Danlos disease    Fatigue 06/20/2014   Gastroenteritis, non-infectious 06/20/2014   GERD (gastroesophageal reflux disease)    Glycosuria 06/20/2014   Headache    History of gestational hypertension 2019   History of TB skin testing 09/22/2015   History of varicella infection 06/20/2014   IBS (irritable bowel syndrome)    Lower urinary tract infectious disease 06/20/2014   Formatting of this note might be different from the original. IMO Problem List Replacer Jan. 2016 Formatting of this note might be different from the original. Formatting of this note might be different from the original. IMO Problem List Replacer Jan. 2016   Lumbar pain 06/20/2014   Migraine without aura and without status migrainosus, not intractable 01/04/2022   Migraines    Mild asthma without complication 01/27/2008   Qualifier: Diagnosis of   By: Derrek Ramp       Nausea 06/20/2014   Pharyngitis 09/30/2017   PONV (postoperative nausea and vomiting)    Prolonged depressive adjustment reaction  11/06/2010   Formatting of this note might be different from the original. Adjustment Disorder With Anxiety And Prolonged Depressed Mood   Scoliosis    Vaginal Pap smear, abnormal    Wears glasses 09/22/2015    Past Surgical History:  Procedure Laterality Date   CHOLECYSTECTOMY  2002   COLPOSCOPY  2004   COMBINED HYSTEROSCOPY DIAGNOSTIC / D&C  2004   DILITATION & CURRETTAGE/HYSTROSCOPY WITH HYDROTHERMAL ABLATION N/A 05/29/2020   Procedure: DILATATION & CURETTAGE/HYSTEROSCOPY WITH NOVASURE ABLATION;  Surgeon: Dannielle Bouchard, DO;  Location: [facility];  Service: Gynecology;  Laterality: N/A;   HYSTEROSCOPY WITH D & C  2004   KNEE ARTHROSCOPY Right 1998 & 1999   LAPAROSCOPIC TUBAL LIGATION Bilateral 05/29/2020   Procedure: BILATERAL LAPAROSCOPIC TUBAL LIGATION WITH FILSHIE CLIPS;  Surgeon: Dannielle Bouchard, DO;  Location: [facility];  Service: Gynecology;  Laterality: Bilateral;   sinus sugrery  1998   TONSILLECTOMY  as child   w/ Adenoidectomy   WRIST SURGERY Right 01/2023    Family History  Problem Relation Age of Onset   Heart failure Maternal Grandmother    Hypertension Maternal Grandmother    Thyroid disease Maternal Grandmother    Arthritis Maternal Grandmother    Heart attack Maternal Grandmother    CAD Maternal Grandmother    Migraines Maternal Grandmother    Depression Mother    Hyperlipidemia Mother    Hypertension Mother    Migraines Mother    Hypertension Maternal Grandfather    Kidney disease Maternal Grandfather    Epilepsy Maternal Grandfather  Social History:  reports that she has never smoked. She has never been exposed to tobacco smoke. She has never used smokeless tobacco. She reports that she does not drink alcohol and does not use drugs.  Allergies: Allergies[1]  Medications Prior to Admission  Medication Sig Dispense Refill   acetaminophen  (TYLENOL ) 500 MG tablet Take 1,000 mg by mouth every 8 (eight) hours as needed  (pain.).     albuterol  (VENTOLIN  HFA) 108 (90 Base) MCG/ACT inhaler Inhale 1-2 puffs into the lungs every 6 (six) hours as needed. 6.7 g 0   Atogepant  (QULIPTA ) 60 MG TABS Take 1 tablet (60 mg total) by mouth daily. (Patient taking differently: Take 60 mg by mouth at bedtime.) 30 tablet 11   cetirizine  (ZYRTEC  ALLERGY) 10 MG tablet Take 1 tablet (10 mg total) by mouth at bedtime. 30 tablet 3   lisdexamfetamine (VYVANSE ) 50 MG capsule Take 1 capsule (50 mg total) by mouth daily. 90 capsule 0   omeprazole (PRILOSEC) 20 MG capsule Take 20 mg by mouth in the morning.     sertraline  (ZOLOFT ) 100 MG tablet Take 1 tablet by mouth at bedtime. 90 tablet 1   topiramate  (TOPAMAX ) 50 MG tablet Take 1 tablet (50 mg total) by mouth 2 (two) times daily  in the morning and evening or as directed (Patient taking differently: Take 50 mg by mouth at bedtime.) 60 tablet 3   Ubrogepant  (UBRELVY ) 100 MG TABS Take 1 tablet (100 mg total) by mouth every 2 (two) hours as needed. Maximum 200mg  a day. 16 tablet 11   ondansetron  (ZOFRAN -ODT) 4 MG disintegrating tablet Dissolve 1 - 2 tablets (4 - 8 mg total) in mouth every 8 hours as needed. (Patient not taking: Reported on 07/24/2024) 30 tablet 11    No results found for this or any previous visit (from the past 48 hours). No results found.  Review of Systems  All other systems reviewed and are negative.   Blood pressure (!) (P) 146/89, pulse (P) 89, temperature (P) 98 F (36.7 C), resp. rate (P) 18, height 5' 7 (1.702 m), weight 72.6 kg, SpO2 (P) 99%. Physical Exam HENT:     Head: Atraumatic.  Eyes:     Extraocular Movements: Extraocular movements intact.  Cardiovascular:     Pulses: Normal pulses.  Musculoskeletal:     Comments: L shoulder pain with RC/ impingement testing. NVID.  Neurological:     Mental Status: She is alert.      Assessment/Plan  Status post injury at work with left shoulder pain which has failed extensive conservative management.  She  has partial-thickness rotator cuff tear and some labral abnormalities by MRI. Plan left shoulder arthroscopy with rotator cuff debridement versus repair, debridement versus repair of the labrum, subacromial decompression. Risks / benefits of surgery discussed Consent on chart  NPO for OR Preop antibiotics   Josefa LELON Herring, MD 08/02/2024, 10:30 AM       [1]  Allergies Allergen Reactions   Cephalexin Hives    Keflex is hives   Amoxicillin -Pot Clavulanate Rash

## 2024-08-02 NOTE — Anesthesia Preprocedure Evaluation (Signed)
 Anesthesia Evaluation  Patient identified by MRN, date of birth, ID band Patient awake    Reviewed: Allergy & Precautions, H&P , NPO status , Patient's Chart, lab work & pertinent test results  History of Anesthesia Complications (+) PONV and history of anesthetic complications  Airway Mallampati: II  TM Distance: >3 FB Neck ROM: Full    Dental no notable dental hx.    Pulmonary asthma    Pulmonary exam normal breath sounds clear to auscultation       Cardiovascular negative cardio ROS Normal cardiovascular exam Rhythm:Regular Rate:Normal     Neuro/Psych  Headaches  Anxiety Depression     negative psych ROS   GI/Hepatic Neg liver ROS,GERD  ,,  Endo/Other  negative endocrine ROS    Renal/GU negative Renal ROS  negative genitourinary   Musculoskeletal negative musculoskeletal ROS (+)    Abdominal   Peds negative pediatric ROS (+)  Hematology negative hematology ROS (+)   Anesthesia Other Findings   Reproductive/Obstetrics negative OB ROS                              Anesthesia Physical Anesthesia Plan  ASA: 2  Anesthesia Plan: General   Post-op Pain Management: Regional block*   Induction: Intravenous  PONV Risk Score and Plan: 4 or greater and Ondansetron , Dexamethasone , Midazolam , TIVA and Treatment may vary due to age or medical condition  Airway Management Planned: Oral ETT  Additional Equipment:   Intra-op Plan:   Post-operative Plan: Extubation in OR  Informed Consent: I have reviewed the patients History and Physical, chart, labs and discussed the procedure including the risks, benefits and alternatives for the proposed anesthesia with the patient or authorized representative who has indicated his/her understanding and acceptance.     Dental advisory given  Plan Discussed with: CRNA  Anesthesia Plan Comments:         Anesthesia Quick Evaluation

## 2024-08-02 NOTE — Anesthesia Procedure Notes (Signed)
 Anesthesia Regional Block: Interscalene brachial plexus block   Pre-Anesthetic Checklist: , timeout performed,  Correct Patient, Correct Site, Correct Laterality,  Correct Procedure, Correct Position, site marked,  Risks and benefits discussed,  Surgical consent,  Pre-op evaluation,  At surgeon's request and post-op pain management  Laterality: Left  Prep: chloraprep       Needles:  Injection technique: Single-shot  Needle Type: Stimiplex     Needle Length: 9cm  Needle Gauge: 21     Additional Needles:   Procedures:,,,, ultrasound used (permanent image in chart),,    Narrative:  Start time: 08/02/2024 10:53 AM End time: 08/02/2024 10:58 AM Injection made incrementally with aspirations every 5 mL.  Performed by: Personally  Anesthesiologist: Cleotilde Butler Dade, MD

## 2024-08-02 NOTE — Op Note (Signed)
 Procedures: Procedure Note  Alison Nichols female 38 y.o. 08/02/2024  Preoperative diagnosis: #1 left shoulder labral tear #2 left shoulder partial rotator cuff tear #3 left shoulder impingement  Postoperative diagnosis: Same  Procedure performed:  #1 left shoulder arthroscopic extensive debridement of anterior labral tear, partial rotator cuff tear, subacromial bursitis  #2 left shoulder arthroscopic subacromial decompression   Surgeons and Role:    DEWAINE Dozier Soulier, MD - Primary     Surgeon: Josefa LELON Dozier   Assistants: Jeoffrey Northern PA-C [name] was present and scrubbed throughout the procedure and was essential in positioning, assisting with the camera and instrumentation,, and closure)  Anesthesia: General endotracheal anesthesia with preoperative interscalene block given by the attending anesthesiologist    Procedure Detail  Estimated Blood Loss: Min         Drains: none  Blood Given: none         Specimens: none        Complications:  * No complications entered in OR log *         Disposition: PACU - hemodynamically stable.         Condition: stable    Procedure:   INDICATIONS FOR SURGERY: The patient is 38 y.o. female who has had left shoulder pain since an injury at work which has failed conservative management over many months with physical therapy and injection therapy.  OPERATIVE FINDINGS: Examination under anesthesia: She has 2+ anterior posterior and inferior laxity  DESCRIPTION OF PROCEDURE: The patient was identified in preoperative  holding area where I personally marked the operative site after  verifying site, side, and procedure with the patient. An interscalene block was given by the attending anesthesiologist the holding area.  The patient was taken back to the operating room where general anesthesia was induced without complication and was placed in the beach-chair position with the back  elevated about 60 degrees and all  extremities and head and neck carefully padded and  positioned.   The left upper extremity was then prepped and  draped in a standard sterile fashion. The appropriate time-out  procedure was carried out. The patient did receive IV antibiotics  within 30 minutes of incision.   A small posterior portal incision was made and the arthroscope was introduced into the joint. An anterior portal was then established above the subscapularis using needle localization. Small cannula was placed anteriorly. Diagnostic arthroscopy was then carried out. She was noted to have generalized joint laxity with moderately positive drive-through sign. The subscapularis was noted to be completely intact.  There was a flap tear of the anterior labrum which is debrided back to healthy labrum.  No detachment required repair.  Glenohumeral joint surfaces were intact without chondromalacia.  The biceps tendon and attachment were both very healthy appearing.  The supraspinatus and infraspinatus were intact.  The arthroscope was then introduced into the subacromial space a standard lateral portal was established with needle localization.  She was noted to have extensive bursitis in the subacromial space.  the shaver was used through the lateral portal to perform extensive bursectomy. Coracoacromial ligament was examined and found to be somewhat frayed indicating some bony impingement .  The bursal surface of the rotator cuff was carefully probed and examined.  There was some superficial fraying but no gross tearing and no exposed tuberosity.  This was debrided back to healthy tendon.  The coracoacromial ligament was taken down off the anterior acromion with the ArthroCare exposing a moderate hooked anterior acromial spur.  A high-speed bur was then used through the lateral portal to take down the anterior acromial spur from lateral to medial in a standard acromioplasty.  The acromioplasty was also viewed from the lateral portal and  the bur was used as necessary to ensure that the acromion was completely flat from posterior to anterior.  The arthroscopic equipment was removed from the joint and the portals were closed with 3-0 nylon in an interrupted fashion. Sterile dressings were then applied including Xeroform 4 x 4's ABDs and tape. The patient was then allowed to awaken from general anesthesia, placed in a sling, transferred to the stretcher and taken to the recovery room in stable condition.   POSTOPERATIVE PLAN: The patient will be discharged home today and will followup in one week for suture removal and wound check.

## 2024-08-02 NOTE — Anesthesia Procedure Notes (Signed)
 Procedure Name: Intubation Date/Time: 08/02/2024 11:25 AM  Performed by: Zulema Leita PARAS, CRNAPre-anesthesia Checklist: Patient identified, Emergency Drugs available, Suction available and Patient being monitored Patient Re-evaluated:Patient Re-evaluated prior to induction Oxygen Delivery Method: Circle system utilized Preoxygenation: Pre-oxygenation with 100% oxygen Induction Type: IV induction Ventilation: Mask ventilation without difficulty Laryngoscope Size: Mac and 4 Grade View: Grade I Tube type: Oral Tube size: 7.0 mm Number of attempts: 1 Airway Equipment and Method: Stylet Placement Confirmation: ETT inserted through vocal cords under direct vision, positive ETCO2 and breath sounds checked- equal and bilateral Secured at: 21 cm Tube secured with: Tape Dental Injury: Teeth and Oropharynx as per pre-operative assessment

## 2024-08-03 ENCOUNTER — Encounter (HOSPITAL_COMMUNITY): Payer: Self-pay | Admitting: Orthopedic Surgery

## 2024-08-04 ENCOUNTER — Other Ambulatory Visit (HOSPITAL_BASED_OUTPATIENT_CLINIC_OR_DEPARTMENT_OTHER): Payer: Self-pay

## 2024-08-04 ENCOUNTER — Encounter (HOSPITAL_BASED_OUTPATIENT_CLINIC_OR_DEPARTMENT_OTHER): Payer: Self-pay

## 2024-08-07 ENCOUNTER — Telehealth: Payer: Self-pay

## 2024-08-07 ENCOUNTER — Other Ambulatory Visit (HOSPITAL_COMMUNITY): Payer: Self-pay

## 2024-08-07 NOTE — Telephone Encounter (Addendum)
 Pharmacy Patient Advocate Encounter   Received notification from Pt Calls Messages that prior authorization for Qulipta  60MG  tablets is required/requested.   Insurance verification completed.   The patient is insured through [employer].   Per test claim: PA required and submitted KEY/EOC/Request #: [id number] APPROVED from 08/07/24 to 08/06/25. Ran test claim, Copay is $125.00. This test claim was processed through [facility]- copay amounts may vary at other pharmacies due to pharmacy/plan contracts, or as the patient moves through the different stages of their insurance plan.

## 2024-08-07 NOTE — Telephone Encounter (Signed)
 Patient notified of PA approval via MyChart

## 2024-08-07 NOTE — Telephone Encounter (Signed)
 I returned call from Alison Nichols at [facility]. I was unable to reach [place] but I was able to leave a voicemail.

## 2024-08-07 NOTE — Telephone Encounter (Signed)
 Copied from CRM [id number]. Topic: Clinical - Medication Prior Auth >> Aug 07, 2024  8:31 AM Franky GRADE wrote: Reason for CRM: Krystal from [facility] is calling to speak with the person taking care of the PA for Rx #: [id number]  Atogepant  (QULIPTA ) 60 MG TABS [[id number]] , He states patient is usually given a discount card that provides two free fills and patient has used them up. Best call back number [phone].

## 2024-08-08 NOTE — Telephone Encounter (Signed)
 I reached out to patient and left a voicemail. I received a call from [place] at [facility]. Krystal stated for patient to text enroll to the number [phone] to receive a code to show to her pharmacy for a lower copay. Patient would need to answer a couple of questions in the process. Patient may also pick up a physical card from the office of [facility].

## 2024-08-15 ENCOUNTER — Other Ambulatory Visit (HOSPITAL_BASED_OUTPATIENT_CLINIC_OR_DEPARTMENT_OTHER): Payer: Self-pay

## 2024-08-19 ENCOUNTER — Telehealth: Admitting: Family

## 2024-08-19 ENCOUNTER — Other Ambulatory Visit (HOSPITAL_BASED_OUTPATIENT_CLINIC_OR_DEPARTMENT_OTHER): Payer: Self-pay

## 2024-08-19 DIAGNOSIS — J101 Influenza due to other identified influenza virus with other respiratory manifestations: Secondary | ICD-10-CM | POA: Diagnosis not present

## 2024-08-19 MED ORDER — OSELTAMIVIR PHOSPHATE 75 MG PO CAPS
75.0000 mg | ORAL_CAPSULE | Freq: Two times a day (BID) | ORAL | 0 refills | Status: DC
  Filled 2024-08-19: qty 10, 5d supply, fill #0

## 2024-08-19 MED ORDER — OSELTAMIVIR PHOSPHATE 75 MG PO CAPS: 75.0000 mg | ORAL_CAPSULE | Freq: Two times a day (BID) | ORAL | 0 refills | Status: AC

## 2024-08-19 MED ORDER — BENZONATATE 100 MG PO CAPS: 100.0000 mg | ORAL_CAPSULE | Freq: Two times a day (BID) | ORAL | 0 refills | Status: AC | PRN

## 2024-08-19 MED ORDER — BENZONATATE 100 MG PO CAPS
100.0000 mg | ORAL_CAPSULE | Freq: Two times a day (BID) | ORAL | 0 refills | Status: DC | PRN
  Filled 2024-08-19: qty 20, 10d supply, fill #0

## 2024-08-19 NOTE — Progress Notes (Signed)
 E visit for Flu like symptoms   We are sorry that you are not feeling well.  Here is how we plan to help! Based on what you have shared with me it looks like you may have a respiratory virus that may be influenza.  Influenza or the flu is  an infection caused by a respiratory virus. The flu virus is highly contagious and persons who did not receive their yearly flu vaccination may catch the flu from close contact.  We have anti-viral medications to treat the viruses that cause this infection. They are not a cure and only shorten the course of the infection. These prescriptions are most effective when they are given within the first 2 days of flu symptoms. Antiviral medications are indicated if you have a high risk of complications from the flu. You should  also consider an antiviral medication if you are in close contact with someone who is at risk. These medications can help patients avoid complications from the flu but have side effects that you should know.   Possible side effects from Tamiflu  or oseltamivir  include nausea, vomiting, diarrhea, dizziness, headaches, eye redness, sleep problems or other respiratory symptoms. You should not take Tamiflu  if you have an allergy to oseltamivir  or any to the ingredients in Tamiflu .  Based upon your symptoms and potential risk factors I have prescribed Oseltamivir  (Tamiflu ).  It has been sent to your designated pharmacy.  You will take one 75 mg capsule orally twice a day for the next 5 days.   For nasal congestion, you may use an oral decongestant such as Mucinex  D or if you have glaucoma or high blood pressure use plain Mucinex .  Saline nasal spray or nasal drops can help and can safely be used as often as needed for congestion.  If you have a sore or scratchy throat, use a saltwater gargle-  to  teaspoon of salt dissolved in a 4-ounce to 8-ounce glass of warm water .  Gargle the solution for approximately 15-30 seconds and then spit.  It is  important not to swallow the solution.  You can also use throat lozenges/cough drops and Chloraseptic spray to help with throat pain or discomfort.  Warm or cold liquids can also be helpful in relieving throat pain.  For headache, pain or general discomfort, you can use Ibuprofen  or Tylenol  as directed.   Some authorities believe that zinc  sprays or the use of Echinacea may shorten the course of your symptoms.  I have prescribed the following medications to help lessen symptoms: I have prescribed Tessalon  Perles 100 mg. You may take 1-2 capsules every 8 hours as needed for cough  You are to isolate at home until you have been fever-free for at least 24 hours without a fever-reducing medication, and symptoms have been steadily improving for 24 hours.  If you must be around other household members who do not have symptoms, you need to make sure that both you and the family members are masking consistently with a high-quality mask.  If you note any worsening of symptoms despite treatment, please seek an in-person evaluation ASAP. If you note any significant shortness of breath or any chest pain, please seek ED evaluation. Please do not delay care!  ANYONE WHO HAS FLU SYMPTOMS SHOULD: Stay home. The flu is highly contagious and going out or to work exposes others! Be sure to drink plenty of fluids. Water  is fine as well as fruit juices, sodas and electrolyte beverages. You may want to stay  away from caffeine or alcohol. If you are nauseated, try taking small sips of liquids. How do you know if you are getting enough fluid? Your urine should be a pale yellow or almost colorless. Get rest. Taking a steamy shower or using a humidifier may help nasal congestion and ease sore throat pain. Using a saline nasal spray works much the same way. Cough drops, hard candies and sore throat lozenges may ease your cough. Line up a caregiver. Have someone check on you regularly.  GET HELP RIGHT AWAY IF: You cannot  keep down liquids or your medications. You become short of breath Your fell like you are going to pass out or loose consciousness. Your symptoms persist after you have completed your treatment plan  MAKE SURE YOU  Understand these instructions. Will watch your condition. Will get help right away if you are not doing well or get worse.  Your e-visit answers were reviewed by a board certified advanced clinical practitioner to complete your personal care plan.  Depending on the condition, your plan could have included both over the counter or prescription medications.  If there is a problem please reply  once you have received a response from your provider.  Your safety is important to us .  If you have drug allergies check your prescription carefully.    You can use MyChart to ask questions about todays visit, request a non-urgent call back, or ask for a work or school excuse for 24 hours related to this e-Visit. If it has been greater than 24 hours you will need to follow up with your provider, or enter a new e-Visit to address those concerns.  You will get an e-mail in the next two days asking about your experience.  I hope that your e-visit has been valuable and will speed your recovery. Thank you for using e-visits.   I have spent 5 minutes in review of e-visit questionnaire, review and updating patient chart, medical decision making and response to patient.   Bari Learn, FNP

## 2024-08-19 NOTE — Addendum Note (Signed)
 Addended by: LAVELL LYE A on: 08/19/2024 09:43 AM   Modules accepted: Orders

## 2024-08-28 ENCOUNTER — Encounter (HOSPITAL_COMMUNITY): Payer: Self-pay | Admitting: Orthopedic Surgery

## 2024-08-29 ENCOUNTER — Other Ambulatory Visit: Payer: Self-pay | Admitting: Family

## 2024-08-29 ENCOUNTER — Other Ambulatory Visit (HOSPITAL_BASED_OUTPATIENT_CLINIC_OR_DEPARTMENT_OTHER): Payer: Self-pay

## 2024-08-29 ENCOUNTER — Encounter (HOSPITAL_BASED_OUTPATIENT_CLINIC_OR_DEPARTMENT_OTHER): Payer: Self-pay

## 2024-08-29 MED ORDER — TOPIRAMATE 50 MG PO TABS
50.0000 mg | ORAL_TABLET | Freq: Two times a day (BID) | ORAL | 3 refills | Status: AC
  Filled 2024-08-29: qty 60, 30d supply, fill #0
  Filled 2024-08-31: qty 60, 30d supply, fill #1
  Filled 2024-09-25: qty 180, 90d supply, fill #1

## 2024-08-31 ENCOUNTER — Encounter (HOSPITAL_BASED_OUTPATIENT_CLINIC_OR_DEPARTMENT_OTHER): Payer: Self-pay

## 2024-09-01 ENCOUNTER — Other Ambulatory Visit (HOSPITAL_BASED_OUTPATIENT_CLINIC_OR_DEPARTMENT_OTHER): Payer: Self-pay

## 2024-09-02 ENCOUNTER — Other Ambulatory Visit: Payer: Self-pay

## 2024-09-03 ENCOUNTER — Encounter (HOSPITAL_BASED_OUTPATIENT_CLINIC_OR_DEPARTMENT_OTHER): Payer: Self-pay

## 2024-09-03 ENCOUNTER — Other Ambulatory Visit (HOSPITAL_BASED_OUTPATIENT_CLINIC_OR_DEPARTMENT_OTHER): Payer: Self-pay

## 2024-09-25 ENCOUNTER — Other Ambulatory Visit: Payer: Self-pay | Admitting: Family

## 2024-09-25 ENCOUNTER — Other Ambulatory Visit: Payer: Self-pay

## 2024-09-25 DIAGNOSIS — F411 Generalized anxiety disorder: Secondary | ICD-10-CM

## 2024-09-25 MED ORDER — SERTRALINE HCL 100 MG PO TABS
100.0000 mg | ORAL_TABLET | Freq: Every day | ORAL | 0 refills | Status: AC
  Filled 2024-09-25: qty 30, 30d supply, fill #0

## 2024-09-26 ENCOUNTER — Other Ambulatory Visit: Payer: Self-pay | Admitting: Family

## 2024-09-26 ENCOUNTER — Other Ambulatory Visit (HOSPITAL_COMMUNITY): Payer: Self-pay

## 2024-09-26 ENCOUNTER — Other Ambulatory Visit: Payer: Self-pay

## 2024-09-26 ENCOUNTER — Encounter: Payer: Self-pay | Admitting: Pharmacist

## 2024-09-26 DIAGNOSIS — F411 Generalized anxiety disorder: Secondary | ICD-10-CM

## 2024-09-27 ENCOUNTER — Other Ambulatory Visit (HOSPITAL_COMMUNITY): Payer: Self-pay
